# Patient Record
Sex: Female | Born: 1980 | Hispanic: No | Marital: Single | Smoking: Current every day smoker
Health system: Southern US, Community
[De-identification: ages and names within clinical notes are randomized; demographics above are authoritative.]

## PROBLEM LIST (undated history)

## (undated) ENCOUNTER — Emergency Department (HOSPITAL_COMMUNITY): Discharge status: Against Medical Advice | Payer: Medicaid Other

## (undated) DIAGNOSIS — R87619 Unspecified abnormal cytological findings in specimens from cervix uteri: Secondary | ICD-10-CM

## (undated) DIAGNOSIS — M549 Dorsalgia, unspecified: Secondary | ICD-10-CM

## (undated) DIAGNOSIS — IMO0002 Reserved for concepts with insufficient information to code with codable children: Secondary | ICD-10-CM

## (undated) DIAGNOSIS — F419 Anxiety disorder, unspecified: Secondary | ICD-10-CM

## (undated) DIAGNOSIS — G8929 Other chronic pain: Secondary | ICD-10-CM

## (undated) DIAGNOSIS — M1990 Unspecified osteoarthritis, unspecified site: Secondary | ICD-10-CM

## (undated) DIAGNOSIS — M51369 Other intervertebral disc degeneration, lumbar region without mention of lumbar back pain or lower extremity pain: Secondary | ICD-10-CM

## (undated) DIAGNOSIS — N83209 Unspecified ovarian cyst, unspecified side: Secondary | ICD-10-CM

## (undated) DIAGNOSIS — M4310 Spondylolisthesis, site unspecified: Secondary | ICD-10-CM

## (undated) DIAGNOSIS — R51 Headache: Secondary | ICD-10-CM

## (undated) DIAGNOSIS — R519 Headache, unspecified: Secondary | ICD-10-CM

## (undated) DIAGNOSIS — M5136 Other intervertebral disc degeneration, lumbar region: Secondary | ICD-10-CM

## (undated) HISTORY — DX: Unspecified osteoarthritis, unspecified site: M19.90

## (undated) HISTORY — DX: Headache, unspecified: R51.9

## (undated) HISTORY — DX: Spondylolisthesis, site unspecified: M43.10

## (undated) HISTORY — DX: Unspecified abnormal cytological findings in specimens from cervix uteri: R87.619

## (undated) HISTORY — DX: Other intervertebral disc degeneration, lumbar region without mention of lumbar back pain or lower extremity pain: M51.369

## (undated) HISTORY — DX: Other intervertebral disc degeneration, lumbar region: M51.36

## (undated) HISTORY — DX: Headache: R51

## (undated) HISTORY — PX: OTHER SURGICAL HISTORY: SHX169

## (undated) HISTORY — DX: Reserved for concepts with insufficient information to code with codable children: IMO0002

---

## 2004-08-08 ENCOUNTER — Emergency Department (HOSPITAL_COMMUNITY): Admission: EM | Admit: 2004-08-08 | Discharge: 2004-08-08 | Payer: Self-pay | Admitting: Emergency Medicine

## 2005-11-06 ENCOUNTER — Emergency Department (HOSPITAL_COMMUNITY): Admission: EM | Admit: 2005-11-06 | Discharge: 2005-11-06 | Payer: Self-pay | Admitting: Emergency Medicine

## 2007-02-19 ENCOUNTER — Emergency Department (HOSPITAL_COMMUNITY): Admission: EM | Admit: 2007-02-19 | Discharge: 2007-02-19 | Payer: Self-pay | Admitting: Emergency Medicine

## 2009-06-28 ENCOUNTER — Emergency Department (HOSPITAL_COMMUNITY): Admission: EM | Admit: 2009-06-28 | Discharge: 2009-06-28 | Payer: Self-pay | Admitting: Emergency Medicine

## 2010-01-21 ENCOUNTER — Emergency Department (HOSPITAL_COMMUNITY): Admission: EM | Admit: 2010-01-21 | Discharge: 2010-01-21 | Payer: Self-pay | Admitting: Emergency Medicine

## 2010-05-13 ENCOUNTER — Emergency Department (HOSPITAL_COMMUNITY): Admission: EM | Admit: 2010-05-13 | Discharge: 2010-05-14 | Payer: Self-pay | Admitting: Emergency Medicine

## 2010-08-07 ENCOUNTER — Encounter
Admission: RE | Admit: 2010-08-07 | Discharge: 2010-09-06 | Payer: Self-pay | Source: Home / Self Care | Attending: Orthopedic Surgery | Admitting: Orthopedic Surgery

## 2010-09-11 ENCOUNTER — Encounter
Admission: RE | Admit: 2010-09-11 | Discharge: 2010-10-09 | Payer: Self-pay | Source: Home / Self Care | Attending: Orthopedic Surgery | Admitting: Orthopedic Surgery

## 2010-09-25 ENCOUNTER — Emergency Department (HOSPITAL_COMMUNITY)
Admission: EM | Admit: 2010-09-25 | Discharge: 2010-09-25 | Payer: Self-pay | Source: Home / Self Care | Admitting: Emergency Medicine

## 2010-09-30 ENCOUNTER — Encounter: Payer: Self-pay | Admitting: Family Medicine

## 2010-12-13 LAB — BASIC METABOLIC PANEL
BUN: 6 mg/dL (ref 6–23)
CO2: 27 mEq/L (ref 19–32)
Calcium: 9.4 mg/dL (ref 8.4–10.5)
Chloride: 105 mEq/L (ref 96–112)
Creatinine, Ser: 0.7 mg/dL (ref 0.4–1.2)
GFR calc Af Amer: >60 mL/min (ref >60)
GFR calc non Af Amer: >60 mL/min (ref >60)
Glucose, Bld: 88 mg/dL (ref 70–99)
Potassium: 3.8 mEq/L (ref 3.5–5.1)
Sodium: 138 mEq/L (ref 135–145)

## 2010-12-13 LAB — CBC
HCT: 39 % (ref 36.0–46.0)
Hemoglobin: 13.6 g/dL (ref 12.0–15.0)
MCHC: 34.9 g/dL (ref 30.0–36.0)
MCV: 89.9 fL (ref 78.0–100.0)
Platelets: 184 10*3/uL (ref 150–400)
RBC: 4.34 MIL/uL (ref 3.87–5.11)
RDW: 14.1 % (ref 11.5–15.5)
WBC: 5.2 10*3/uL (ref 4.0–10.5)

## 2010-12-13 LAB — DIFFERENTIAL
Basophils Absolute: 0 10*3/uL (ref 0.0–0.1)
Basophils Relative: 0 % (ref 0–1)
Eosinophils Absolute: 0.1 10*3/uL (ref 0.0–0.7)
Eosinophils Relative: 2 % (ref 0–5)
Lymphocytes Relative: 37 % (ref 12–46)
Lymphs Abs: 1.9 10*3/uL (ref 0.7–4.0)
Monocytes Absolute: 0.2 10*3/uL (ref 0.1–1.0)
Monocytes Relative: 4 % (ref 3–12)
Neutro Abs: 2.9 10*3/uL (ref 1.7–7.7)
Neutrophils Relative %: 57 % (ref 43–77)

## 2010-12-13 LAB — POCT CARDIAC MARKERS
CKMB, poc: <1 ng/mL — ABNORMAL LOW (ref 1.0–8.0)
Myoglobin, poc: 64.2 ng/mL (ref 12–200)
Troponin i, poc: <0.05 ng/mL (ref 0.00–0.09)

## 2010-12-13 LAB — D-DIMER, QUANTITATIVE: D-Dimer, Quant: 0.22 ug/mL-FEU (ref 0.00–0.48)

## 2011-02-08 ENCOUNTER — Ambulatory Visit (HOSPITAL_COMMUNITY)
Admission: RE | Admit: 2011-02-08 | Discharge: 2011-02-08 | Disposition: A | Discharge status: Home / Self Care | Payer: Medicaid Other | Source: Ambulatory Visit | Attending: Pulmonary Disease | Admitting: Pulmonary Disease

## 2011-02-08 ENCOUNTER — Other Ambulatory Visit (HOSPITAL_COMMUNITY): Payer: Self-pay | Admitting: Pulmonary Disease

## 2011-02-08 DIAGNOSIS — A159 Respiratory tuberculosis unspecified: Secondary | ICD-10-CM

## 2011-02-08 DIAGNOSIS — R7611 Nonspecific reaction to tuberculin skin test without active tuberculosis: Secondary | ICD-10-CM | POA: Insufficient documentation

## 2011-06-02 ENCOUNTER — Emergency Department (HOSPITAL_COMMUNITY)
Admission: EM | Admit: 2011-06-02 | Discharge: 2011-06-02 | Disposition: A | Discharge status: Home / Self Care | Payer: Self-pay | Attending: Emergency Medicine | Admitting: Emergency Medicine

## 2011-06-02 ENCOUNTER — Encounter: Payer: Self-pay | Admitting: *Deleted

## 2011-06-02 ENCOUNTER — Other Ambulatory Visit: Payer: Self-pay

## 2011-06-02 ENCOUNTER — Emergency Department (HOSPITAL_COMMUNITY): Payer: Self-pay

## 2011-06-02 DIAGNOSIS — R0789 Other chest pain: Secondary | ICD-10-CM | POA: Insufficient documentation

## 2011-06-02 DIAGNOSIS — F17200 Nicotine dependence, unspecified, uncomplicated: Secondary | ICD-10-CM | POA: Insufficient documentation

## 2011-06-02 HISTORY — DX: Reserved for concepts with insufficient information to code with codable children: IMO0002

## 2011-06-02 LAB — BASIC METABOLIC PANEL
CO2: 28 mEq/L (ref 19–32)
Chloride: 100 mEq/L (ref 96–112)
Creatinine, Ser: 0.71 mg/dL (ref 0.50–1.10)
GFR calc Af Amer: >60 mL/min (ref >60)
Glucose, Bld: 81 mg/dL (ref 70–99)
Potassium: 3.5 mEq/L (ref 3.5–5.1)
Sodium: 135 mEq/L (ref 135–145)

## 2011-06-02 LAB — TROPONIN I: Troponin I: <0.3 ng/mL (ref <0.30)

## 2011-06-02 LAB — DIFFERENTIAL
Basophils Absolute: 0 10*3/uL (ref 0.0–0.1)
Basophils Relative: 0 % (ref 0–1)
Eosinophils Absolute: 0.1 10*3/uL (ref 0.0–0.7)
Eosinophils Relative: 2 % (ref 0–5)
Lymphocytes Relative: 29 % (ref 12–46)
Lymphs Abs: 2 10*3/uL (ref 0.7–4.0)
Monocytes Relative: 4 % (ref 3–12)
Neutrophils Relative %: 65 % (ref 43–77)

## 2011-06-02 LAB — CBC
Hemoglobin: 12.7 g/dL (ref 12.0–15.0)
MCH: 31.1 pg (ref 26.0–34.0)
MCV: 92.6 fL (ref 78.0–100.0)
Platelets: 191 10*3/uL (ref 150–400)
RBC: 4.08 MIL/uL (ref 3.87–5.11)
WBC: 6.8 10*3/uL (ref 4.0–10.5)

## 2011-06-02 MED ORDER — IBUPROFEN 800 MG PO TABS: 800.0000 mg | ORAL_TABLET | Freq: Three times a day (TID) | ORAL | Status: AC | PRN

## 2011-06-02 NOTE — ED Notes (Signed)
Pt c/o right sided chest pressure that radiates up neck and to head

## 2011-06-02 NOTE — ED Provider Notes (Signed)
History    Scribed for Yesenia Bonier, MD, the patient was seen in room APA19/APA19. This chart was scribed by Katha Cabal. This patient's care was started at 3:06 PM.     CSN: [id number] Arrival date & time: 06/02/2011  2:44 PM  Chief Complaint  Patient presents with  . Chest Pain    right side    HPI  (Consider location/radiation/quality/duration/timing/severity/associated sxs/prior treatment)  HPI  Yesenia Gates is a 30 y.o. female who presents to the Emergency Department complaining of intermittent dull right sided chest pain described as pressure that radiates up neck to the back of her head.  Chest pain is associated with discomfort in right arm, pressure in back on head, SOB (resolved), and lightheadedness (resolved).  Patient states chest pain was sharp at onset of sx around1 PM.  Patient states that head pain began upon arrival in ED.  Denies vision disturbance, cough, fever, n/v/d, abdominal pain, recent heavy lifting, CAD, and HTN.  States head pain began after she arrived in ED.  Patient adds hx of herniated disc with baseline back pain.     PAST MEDICAL HISTORY:  Past Medical History  Diagnosis Date  . Herniated disc     PAST SURGICAL HISTORY:  History reviewed. No pertinent past surgical history.  FAMILY HISTORY:  History reviewed. No pertinent family history.   SOCIAL HISTORY: History   Social History  . Marital Status: Single    Spouse Name: N/A    Number of Children: N/A  . Years of Education: N/A   Social History Main Topics  . Smoking status: Current Everyday Smoker  . Smokeless tobacco: None  . Alcohol Use:      occasionally  . Drug Use: No  . Sexually Active:    Other Topics Concern  . None   Social History Narrative  . None     Review of Systems  Review of Systems 10 Systems reviewed and are negative for acute change except as noted in the HPI.  Allergies  Penicillins  Home Medications   Current Outpatient Rx  Name  Route Sig Dispense Refill  . IBUPROFEN 800 MG PO TABS Oral Take 1 tablet (800 mg total) by mouth every 8 (eight) hours as needed for pain. 20 tablet 0    Physical Exam    BP 120/65  Pulse 89  Temp(Src) 98.7 F (37.1 C) (Oral)  Resp 18  Ht 5\' 6"  (1.676 m)  Wt 154 lb (69.854 kg)  BMI 24.86 kg/m2  SpO2 100%  LMP 04/24/2011  Physical Exam  Constitutional: She appears well-developed and well-nourished.  HENT:  Head: Normocephalic and atraumatic.  Right Ear: External ear normal.  Left Ear: External ear normal.  Mouth/Throat: Oropharynx is clear and moist.  Eyes: EOM are normal. Pupils are equal, round, and reactive to light.       Nlm fundoscopy.  Nlm peripheral vision.   Neck: Neck supple.  Cardiovascular: Normal rate, regular rhythm and normal heart sounds.   No murmur heard. Pulmonary/Chest: Effort normal and breath sounds normal. No respiratory distress. She has no wheezes. She has no rales. She exhibits tenderness.       Right anterior chest tenderness.  Pain is reproducible with palpation.   Lungs are clear bilaterally.   Abdominal: Soft. She exhibits no distension. There is no tenderness. There is no rebound.  Musculoskeletal: Normal range of motion. She exhibits no edema.  Neurological: She is alert.  Reflex Scores:      Patellar  reflexes are 2+ on the right side and 2+ on the left side.      Nlm coordination bilaterally.     Skin: Skin is warm and dry. No rash noted.  Psychiatric: She has a normal mood and affect. Her behavior is normal.    ED Course  Procedures (including critical care time) OTHER DATA REVIEWED: Nursing notes, vital signs, and past medical records reviewed.   DIAGNOSTIC STUDIES: Oxygen Saturation is 100% on room air, normal by my interpretation.     Date: 06/02/2011  Rate: 85  Rhythm: normal sinus rhythm  QRS Axis: normal  Intervals: normal  ST/T Wave abnormalities: normal  Conduction Disutrbances:none  Narrative Interpretation:   Old  EKG Reviewed: unchanged   Normal, Interpreted by EDMD.   LABS / RADIOLOGY:  Results for orders placed during the hospital encounter of 06/02/11  TROPONIN I      Component Value Range   Troponin I <0.30  <0.30 (ng/mL)  BASIC METABOLIC PANEL      Component Value Range   Sodium 135  135 - 145 (mEq/L)   Potassium 3.5  3.5 - 5.1 (mEq/L)   Chloride 100  96 - 112 (mEq/L)   CO2 28  19 - 32 (mEq/L)   Glucose, Bld 81  70 - 99 (mg/dL)   BUN 10  6 - 23 (mg/dL)   Creatinine, Ser 0.98  0.50 - 1.10 (mg/dL)   Calcium 9.2  8.4 - 11.9 (mg/dL)   GFR calc non Af Amer >60  >60 (mL/min)   GFR calc Af Amer >60  >60 (mL/min)  CBC      Component Value Range   WBC 6.8  4.0 - 10.5 (K/uL)   RBC 4.08  3.87 - 5.11 (MIL/uL)   Hemoglobin 12.7  12.0 - 15.0 (g/dL)   HCT 14.7  82.9 - 56.2 (%)   MCV 92.6  78.0 - 100.0 (fL)   MCH 31.1  26.0 - 34.0 (pg)   MCHC 33.6  30.0 - 36.0 (g/dL)   RDW 13.0  86.5 - 78.4 (%)   Platelets 191  150 - 400 (K/uL)  DIFFERENTIAL      Component Value Range   Neutrophils Relative 65  43 - 77 (%)   Neutro Abs 4.4  1.7 - 7.7 (K/uL)   Lymphocytes Relative 29  12 - 46 (%)   Lymphs Abs 2.0  0.7 - 4.0 (K/uL)   Monocytes Relative 4  3 - 12 (%)   Monocytes Absolute 0.3  0.1 - 1.0 (K/uL)   Eosinophils Relative 2  0 - 5 (%)   Eosinophils Absolute 0.1  0.0 - 0.7 (K/uL)   Basophils Relative 0  0 - 1 (%)   Basophils Absolute 0.0  0.0 - 0.1 (K/uL)     Dg Chest 2 View  06/02/2011  *RADIOLOGY REPORT*  Clinical Data: Chest pain.  CHEST - 2 VIEW 06/02/2011:  Comparison: Two-view chest x-ray 02/08/2011 and 06/28/2009 [facility].  Findings: Cardiomediastinal silhouette unremarkable.  Lungs clear. Bronchovascular markings normal.  Pulmonary vascularity normal.  No pleural effusions.  No pneumothorax.  Visualized bony thorax intact.  No significant interval change.  IMPRESSION: Normal and stable examination.  Original Report Authenticated By: Arnell Sieving, M.D.       ED COURSE  / COORDINATION OF CARE: 3:49 PM  Physical exam complete.    Orders Placed This Encounter  Procedures  . DG Chest 2 View  . Troponin I  . Basic metabolic panel  .  CBC  . Differential   the patient appears to have uncomplicated musculoskeletal chest pain. I will discharge her home on symptomatic treatment.  MDM:  DDX: Musculoskeletal chest pain, Pneumonia , tension headache.  MI and acute coronary syndrome is thought to be less likely based on her hx and physical examination.     IMPRESSION: Diagnoses that have been ruled out:  Diagnoses that are still under consideration:  Final diagnoses:  Musculoskeletal chest pain     MEDICATIONS GIVEN IN THE E.D. Scheduled Meds:   Continuous Infusions:      DISCHARGE MEDICATIONS: New Prescriptions   IBUPROFEN (ADVIL,MOTRIN) 800 MG TABLET    Take 1 tablet (800 mg total) by mouth every 8 (eight) hours as needed for pain.     I personally performed the services described in this documentation, which was scribed in my presence. The recorded information has been reviewed and considered.            Yesenia Bonier, MD 06/02/11 [phone]

## 2011-06-28 ENCOUNTER — Other Ambulatory Visit: Payer: Self-pay | Admitting: Otolaryngology

## 2011-06-28 DIAGNOSIS — R599 Enlarged lymph nodes, unspecified: Secondary | ICD-10-CM

## 2011-11-12 ENCOUNTER — Ambulatory Visit: Payer: Medicaid Other | Attending: Physical Therapy | Admitting: Physical Therapy

## 2011-12-13 ENCOUNTER — Other Ambulatory Visit: Payer: Self-pay | Admitting: Family Medicine

## 2011-12-13 DIAGNOSIS — M545 Low back pain, unspecified: Secondary | ICD-10-CM

## 2011-12-17 ENCOUNTER — Ambulatory Visit (HOSPITAL_COMMUNITY): Admission: RE | Admit: 2011-12-17 | Payer: Medicaid Other | Source: Ambulatory Visit

## 2012-01-21 ENCOUNTER — Other Ambulatory Visit (HOSPITAL_COMMUNITY): Payer: Medicaid Other

## 2012-01-21 ENCOUNTER — Ambulatory Visit (HOSPITAL_COMMUNITY)
Admission: RE | Admit: 2012-01-21 | Discharge: 2012-01-21 | Disposition: A | Discharge status: Home / Self Care | Payer: Medicaid Other | Source: Ambulatory Visit | Attending: Family Medicine | Admitting: Family Medicine

## 2012-01-21 DIAGNOSIS — M545 Low back pain, unspecified: Secondary | ICD-10-CM

## 2012-03-24 ENCOUNTER — Ambulatory Visit (HOSPITAL_COMMUNITY)
Admission: RE | Admit: 2012-03-24 | Discharge: 2012-03-24 | Disposition: A | Discharge status: Home / Self Care | Payer: Medicaid Other | Source: Ambulatory Visit | Attending: Otolaryngology | Admitting: Otolaryngology

## 2012-03-24 DIAGNOSIS — R599 Enlarged lymph nodes, unspecified: Secondary | ICD-10-CM | POA: Insufficient documentation

## 2012-04-16 ENCOUNTER — Emergency Department (HOSPITAL_COMMUNITY)
Admission: EM | Admit: 2012-04-16 | Discharge: 2012-04-16 | Disposition: A | Discharge status: Home / Self Care | Payer: Medicaid Other | Attending: Emergency Medicine | Admitting: Emergency Medicine

## 2012-04-16 ENCOUNTER — Encounter (HOSPITAL_COMMUNITY): Payer: Self-pay | Admitting: Emergency Medicine

## 2012-04-16 DIAGNOSIS — F17200 Nicotine dependence, unspecified, uncomplicated: Secondary | ICD-10-CM | POA: Insufficient documentation

## 2012-04-16 DIAGNOSIS — J029 Acute pharyngitis, unspecified: Secondary | ICD-10-CM

## 2012-04-16 LAB — RAPID STREP SCREEN (MED CTR MEBANE ONLY): Streptococcus, Group A Screen (Direct): NEGATIVE

## 2012-04-16 MED ORDER — AZITHROMYCIN 250 MG PO TABS: ORAL_TABLET | ORAL | Status: DC

## 2012-04-16 MED ORDER — AZITHROMYCIN 250 MG PO TABS
500.0000 mg | ORAL_TABLET | Freq: Once | ORAL | Status: AC
  Administered 2012-04-16: 500 mg via ORAL
  Filled 2012-04-16: qty 2

## 2012-04-16 NOTE — ED Provider Notes (Signed)
History     CSN: [id number]  Arrival date & time 04/16/12  0844   First MD Initiated Contact with Patient 04/16/12 [phone]      Chief Complaint  Patient presents with  . Sore Throat    (Consider location/radiation/quality/duration/timing/severity/associated sxs/prior treatment) HPI Comments: Denies fever, chills or headache.  Patient is a 31 y.o. female presenting with pharyngitis. The history is provided by the patient. No language interpreter was used.  Sore Throat This is a new problem. Episode onset: 3 days ago. The problem occurs constantly. The problem has been unchanged. Associated symptoms include a sore throat. Pertinent negatives include no chest pain, chills, fever or headaches. The symptoms are aggravated by swallowing. Treatments tried: tramadol. The treatment provided mild relief.    Past Medical History  Diagnosis Date  . Herniated disc     History reviewed. No pertinent past surgical history.  History reviewed. No pertinent family history.  History  Substance Use Topics  . Smoking status: Current Everyday Smoker  . Smokeless tobacco: Not on file  . Alcohol Use:      occasionally    OB History    Grav Para Term Preterm Abortions TAB SAB Ect Mult Living                  Review of Systems  Constitutional: Negative for fever and chills.  HENT: Positive for sore throat.   Respiratory: Negative for shortness of breath.   Cardiovascular: Negative for chest pain.  Neurological: Negative for headaches.  All other systems reviewed and are negative.    Allergies  Penicillins  Home Medications   Current Outpatient Rx  Name Route Sig Dispense Refill  . MEDROXYPROGESTERONE ACETATE 150 MG/ML IM SUSP Intramuscular Inject 150 mg into the muscle every 3 (three) months.    . WOMENS ONE DAILY PO TABS Oral Take 1 tablet by mouth daily.    . TRAMADOL HCL 50 MG PO TABS Oral Take 50 mg by mouth every 6 (six) hours as needed. For pain    . AZITHROMYCIN 250 MG PO  TABS  One tab po QD.  (initial dose given in ED) 4 tablet 0    BP 106/64  Pulse 100  Temp 98.3 F (36.8 C) (Oral)  Resp 17  SpO2 99%  LMP 03/16/2012  Physical Exam  Nursing note and vitals reviewed. Constitutional: She is oriented to person, place, and time. She appears well-developed and well-nourished. No distress.  HENT:  Head: Normocephalic and atraumatic.  Mouth/Throat: Uvula is midline and mucous membranes are normal. No uvula swelling. Oropharyngeal exudate and posterior oropharyngeal erythema present. No posterior oropharyngeal edema or tonsillar abscesses.  Eyes: EOM are normal.  Neck: Normal range of motion.  Cardiovascular: Normal rate, regular rhythm and normal heart sounds.   Pulmonary/Chest: Effort normal and breath sounds normal.  Abdominal: Soft. She exhibits no distension. There is no tenderness.  Musculoskeletal: Normal range of motion.  Neurological: She is alert and oriented to person, place, and time.  Skin: Skin is warm and dry.  Psychiatric: She has a normal mood and affect. Judgment normal.    ED Course  Procedures (including critical care time)   Labs Reviewed  RAPID STREP SCREEN   No results found.   1. Pharyngitis       MDM  rx-zithromax Tramadol, ibuprofen, salt water gargle and chloraseptic.  F/u with PCP prn        Evalina Field, PA 04/16/12 [phone]

## 2012-04-16 NOTE — ED Notes (Signed)
Pt c/o sore throat x 3 days. Nad. No fevers

## 2012-04-17 NOTE — ED Provider Notes (Signed)
Medical screening examination/treatment/procedure(s) were performed by non-physician practitioner and as supervising physician I was immediately available for consultation/collaboration.  Daveah Varone, MD 04/17/12 0842 

## 2012-04-22 ENCOUNTER — Encounter (HOSPITAL_COMMUNITY): Payer: Self-pay | Admitting: *Deleted

## 2012-04-22 ENCOUNTER — Emergency Department (HOSPITAL_COMMUNITY)
Admission: EM | Admit: 2012-04-22 | Discharge: 2012-04-22 | Disposition: A | Discharge status: Home / Self Care | Payer: Medicaid Other | Attending: Emergency Medicine | Admitting: Emergency Medicine

## 2012-04-22 ENCOUNTER — Emergency Department (HOSPITAL_COMMUNITY): Payer: Medicaid Other

## 2012-04-22 DIAGNOSIS — T07XXXA Unspecified multiple injuries, initial encounter: Secondary | ICD-10-CM | POA: Insufficient documentation

## 2012-04-22 DIAGNOSIS — F17200 Nicotine dependence, unspecified, uncomplicated: Secondary | ICD-10-CM | POA: Insufficient documentation

## 2012-04-22 DIAGNOSIS — S93409A Sprain of unspecified ligament of unspecified ankle, initial encounter: Secondary | ICD-10-CM | POA: Insufficient documentation

## 2012-04-22 DIAGNOSIS — Y92009 Unspecified place in unspecified non-institutional (private) residence as the place of occurrence of the external cause: Secondary | ICD-10-CM | POA: Insufficient documentation

## 2012-04-22 MED ORDER — HYDROCODONE-ACETAMINOPHEN 5-325 MG PO TABS: 1.0000 | ORAL_TABLET | ORAL | Status: AC | PRN

## 2012-04-22 MED ORDER — IBUPROFEN 600 MG PO TABS: 600.0000 mg | ORAL_TABLET | Freq: Four times a day (QID) | ORAL | Status: AC | PRN

## 2012-04-22 MED ORDER — DOUBLE ANTIBIOTIC 500-10000 UNIT/GM EX OINT
TOPICAL_OINTMENT | Freq: Once | CUTANEOUS | Status: AC
  Administered 2012-04-22: 17:00:00 via TOPICAL
  Filled 2012-04-22: qty 1

## 2012-04-22 MED ORDER — TETANUS-DIPHTH-ACELL PERTUSSIS 5-2.5-18.5 LF-MCG/0.5 IM SUSP
0.5000 mL | Freq: Once | INTRAMUSCULAR | Status: AC
  Administered 2012-04-22: 0.5 mL via INTRAMUSCULAR
  Filled 2012-04-22: qty 0.5
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
## 2012-04-22 NOTE — ED Provider Notes (Signed)
Medical screening examination/treatment/procedure(s) were performed by non-physician practitioner and as supervising physician I was immediately available for consultation/collaboration.  Devoria Albe, MD, Armando Gang   Ward Givens, MD 04/22/12 [phone]
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
## 2012-06-23 ENCOUNTER — Emergency Department (HOSPITAL_COMMUNITY)
Admission: EM | Admit: 2012-06-23 | Discharge: 2012-06-23 | Disposition: A | Discharge status: Home / Self Care | Payer: Medicaid Other

## 2012-07-06 ENCOUNTER — Emergency Department (HOSPITAL_COMMUNITY)
Admission: EM | Admit: 2012-07-06 | Discharge: 2012-07-07 | Disposition: A | Discharge status: Home / Self Care | Payer: Medicaid Other | Attending: Emergency Medicine | Admitting: Emergency Medicine

## 2012-07-06 ENCOUNTER — Encounter (HOSPITAL_COMMUNITY): Payer: Self-pay

## 2012-07-06 ENCOUNTER — Other Ambulatory Visit: Payer: Self-pay

## 2012-07-06 ENCOUNTER — Emergency Department (HOSPITAL_COMMUNITY): Payer: Medicaid Other

## 2012-07-06 DIAGNOSIS — Z8739 Personal history of other diseases of the musculoskeletal system and connective tissue: Secondary | ICD-10-CM | POA: Insufficient documentation

## 2012-07-06 DIAGNOSIS — R0789 Other chest pain: Secondary | ICD-10-CM

## 2012-07-06 DIAGNOSIS — Z79899 Other long term (current) drug therapy: Secondary | ICD-10-CM | POA: Insufficient documentation

## 2012-07-06 DIAGNOSIS — R42 Dizziness and giddiness: Secondary | ICD-10-CM | POA: Insufficient documentation

## 2012-07-06 DIAGNOSIS — F17200 Nicotine dependence, unspecified, uncomplicated: Secondary | ICD-10-CM | POA: Insufficient documentation

## 2012-07-06 DIAGNOSIS — M7918 Myalgia, other site: Secondary | ICD-10-CM

## 2012-07-06 DIAGNOSIS — R071 Chest pain on breathing: Secondary | ICD-10-CM | POA: Insufficient documentation

## 2012-07-06 DIAGNOSIS — Z8659 Personal history of other mental and behavioral disorders: Secondary | ICD-10-CM | POA: Insufficient documentation

## 2012-07-06 DIAGNOSIS — IMO0001 Reserved for inherently not codable concepts without codable children: Secondary | ICD-10-CM | POA: Insufficient documentation

## 2012-07-06 HISTORY — DX: Anxiety disorder, unspecified: F41.9

## 2012-07-06 LAB — URINALYSIS, ROUTINE W REFLEX MICROSCOPIC
Bilirubin Urine: NEGATIVE
Glucose, UA: NEGATIVE mg/dL
Hgb urine dipstick: NEGATIVE
Specific Gravity, Urine: 1.02 (ref 1.005–1.030)

## 2012-07-06 LAB — CBC WITH DIFFERENTIAL/PLATELET
Basophils Absolute: 0 10*3/uL (ref 0.0–0.1)
Eosinophils Absolute: 0.1 10*3/uL (ref 0.0–0.7)
Eosinophils Relative: 2 % (ref 0–5)
HCT: 37.3 % (ref 36.0–46.0)
Lymphocytes Relative: 42 % (ref 12–46)
Lymphs Abs: 2.8 10*3/uL (ref 0.7–4.0)
MCH: 31.6 pg (ref 26.0–34.0)
MCV: 92.1 fL (ref 78.0–100.0)
Monocytes Absolute: 0.3 10*3/uL (ref 0.1–1.0)
RDW: 14.3 % (ref 11.5–15.5)
WBC: 6.6 10*3/uL (ref 4.0–10.5)

## 2012-07-06 LAB — BASIC METABOLIC PANEL
CO2: 25 mEq/L (ref 19–32)
Calcium: 9.1 mg/dL (ref 8.4–10.5)
Creatinine, Ser: 0.64 mg/dL (ref 0.50–1.10)
GFR calc non Af Amer: >90 mL/min (ref >90)
Glucose, Bld: 116 mg/dL — ABNORMAL HIGH (ref 70–99)

## 2012-07-06 MED ORDER — DIAZEPAM 5 MG PO TABS
5.0000 mg | ORAL_TABLET | Freq: Once | ORAL | Status: AC
  Administered 2012-07-06: 5 mg via ORAL
  Filled 2012-07-06: qty 1

## 2012-07-06 MED ORDER — POTASSIUM CHLORIDE 20 MEQ/15ML (10%) PO LIQD
40.0000 meq | Freq: Once | ORAL | Status: AC
  Administered 2012-07-06: 40 meq via ORAL
  Filled 2012-07-06: qty 30

## 2012-07-06 NOTE — ED Notes (Signed)
Pain in right side of head, going down right side of neck into right shoulder and right chest, under right breast per pt. Also goes into right leg per pt. Patient states that she was dizzy and nauseated at work.

## 2012-07-06 NOTE — ED Provider Notes (Signed)
History     CSN: [id number]  Arrival date & time 07/06/12  1952   First MD Initiated Contact with Patient 07/06/12 2100      Chief Complaint  Patient presents with  . Headache  . Chest Pain  . Dizziness    HPI Pt was seen at 2120.  Per pt, c/o gradual onset and persistence of constant right sided neck "pain" since this afternoon.  Pt states the pain radiates into her right shoulder and chest as well as radiates down her entire body to her right leg.  Denies injury, no headache, no CP/SOB, no abd pain, no flank pain, no N/V/D, no focal motor weakness, no tingling/numbness in extremities, no rash, no fevers.      Past Medical History  Diagnosis Date  . Herniated disc   . Anxiety     History reviewed. No pertinent past surgical history.   History  Substance Use Topics  . Smoking status: Current Every Day Smoker  . Smokeless tobacco: Not on file  . Alcohol Use: Yes     occasionally    Review of Systems ROS: Statement: All systems negative except as marked or noted in the HPI; Constitutional: Negative for fever and chills. ; ; Eyes: Negative for eye pain, redness and discharge. ; ; ENMT: Negative for ear pain, hoarseness, nasal congestion, sinus pressure and sore throat. ; ; Cardiovascular: +chest pain. Negative for palpitations, diaphoresis, dyspnea and peripheral edema. ; ; Respiratory: Negative for cough, wheezing and stridor. ; ; Gastrointestinal: Negative for nausea, vomiting, diarrhea, abdominal pain, blood in stool, hematemesis, jaundice and rectal bleeding. . ; ; Genitourinary: Negative for dysuria, flank pain and hematuria. ; ; Musculoskeletal: +neck and back pain. Negative for swelling and trauma.; ; Skin: Negative for pruritus, rash, abrasions, blisters, bruising and skin lesion.; ; Neuro: Negative for headache, lightheadedness and neck stiffness. Negative for weakness, altered level of consciousness , altered mental status, extremity weakness, paresthesias, involuntary  movement, seizure and syncope.       Allergies  Penicillins  Home Medications   Current Outpatient Rx  Name Route Sig Dispense Refill  . MEDROXYPROGESTERONE ACETATE 150 MG/ML IM SUSP Intramuscular Inject 150 mg into the muscle every 3 (three) months.      BP 111/70  Pulse 82  Temp 98.7 F (37.1 C) (Oral)  Resp 20  Ht 5\' 6"  (1.676 m)  Wt 170 lb (77.111 kg)  BMI 27.44 kg/m2  SpO2 95%  Physical Exam 2125: Physical examination:  Nursing notes reviewed; Vital signs and O2 SAT reviewed;  Constitutional: Well developed, Well nourished, Well hydrated, In no acute distress; Head:  Normocephalic, atraumatic; Eyes: EOMI, PERRL, No scleral icterus; ENMT: Mouth and pharynx normal, Mucous membranes moist; Neck: Supple, Full range of motion, No lymphadenopathy; Cardiovascular: Regular rate and rhythm, No murmur, rub, or gallop; Respiratory: Breath sounds clear & equal bilaterally, No rales, rhonchi, wheezes.  Speaking full sentences with ease, Normal respiratory effort/excursion; Chest: +tender right chest wall. No soft tissue crepitus, no rash. Movement normal; Abdomen: Soft, Nontender, Nondistended, Normal bowel sounds; Genitourinary: No CVA tenderness; Spine:  No midline CS, TS, LS tenderness.  +TTP right hypertonic trapezius muscle, right thoracic and lumbar paraspinal muscles;; Extremities: Pulses normal, No tenderness, No edema, No calf edema or asymmetry.; Neuro: AA&Ox3, Major CN grossly intact.  No facial droop.  Speech clear. Equal grips, strength 5/5 equal bilat UE's and LE's. No gross focal motor or sensory deficits in extremities.; Skin: Color normal, Warm, Dry.   ED Course  Procedures    MDM  MDM Reviewed: nursing note, vitals and previous chart Reviewed previous: ECG and labs Interpretation: labs, ECG and x-ray    Date: 07/06/2012  Rate: 94  Rhythm: normal sinus rhythm  QRS Axis: right  Intervals: normal  ST/T Wave abnormalities: normal  Conduction Disutrbances:none   Narrative Interpretation:   Old EKG Reviewed: changes noted; new RAD compared to previous EKG dated 06/02/2011.  Results for orders placed during the hospital encounter of 07/06/12  CBC WITH DIFFERENTIAL      Component Value Range   WBC 6.6  4.0 - 10.5 K/uL   RBC 4.05  3.87 - 5.11 MIL/uL   Hemoglobin 12.8  12.0 - 15.0 g/dL   HCT 21.3  08.6 - 57.8 %   MCV 92.1  78.0 - 100.0 fL   MCH 31.6  26.0 - 34.0 pg   MCHC 34.3  30.0 - 36.0 g/dL   RDW 46.9  62.9 - 52.8 %   Platelets 205  150 - 400 K/uL   Neutrophils Relative 51  43 - 77 %   Neutro Abs 3.4  1.7 - 7.7 K/uL   Lymphocytes Relative 42  12 - 46 %   Lymphs Abs 2.8  0.7 - 4.0 K/uL   Monocytes Relative 4  3 - 12 %   Monocytes Absolute 0.3  0.1 - 1.0 K/uL   Eosinophils Relative 2  0 - 5 %   Eosinophils Absolute 0.1  0.0 - 0.7 K/uL   Basophils Relative 0  0 - 1 %   Basophils Absolute 0.0  0.0 - 0.1 K/uL  BASIC METABOLIC PANEL      Component Value Range   Sodium 137  135 - 145 mEq/L   Potassium 3.2 (*) 3.5 - 5.1 mEq/L   Chloride 103  96 - 112 mEq/L   CO2 25  19 - 32 mEq/L   Glucose, Bld 116 (*) 70 - 99 mg/dL   BUN 9  6 - 23 mg/dL   Creatinine, Ser 4.13  0.50 - 1.10 mg/dL   Calcium 9.1  8.4 - 24.4 mg/dL   GFR calc non Af Amer >90  >90 mL/min   GFR calc Af Amer >90  >90 mL/min  TROPONIN I      Component Value Range   Troponin I <0.30  <0.30 ng/mL  D-DIMER, QUANTITATIVE      Component Value Range   D-Dimer, Quant <0.27  0.00 - 0.48 ug/mL-FEU  PREGNANCY, URINE      Component Value Range   Preg Test, Ur NEGATIVE  NEGATIVE  URINALYSIS, ROUTINE W REFLEX MICROSCOPIC      Component Value Range   Color, Urine YELLOW  YELLOW   APPearance CLEAR  CLEAR   Specific Gravity, Urine 1.020  1.005 - 1.030   pH 6.0  5.0 - 8.0   Glucose, UA NEGATIVE  NEGATIVE mg/dL   Hgb urine dipstick NEGATIVE  NEGATIVE   Bilirubin Urine NEGATIVE  NEGATIVE   Ketones, ur NEGATIVE  NEGATIVE mg/dL   Protein, ur NEGATIVE  NEGATIVE mg/dL   Urobilinogen, UA 0.2   0.0 - 1.0 mg/dL   Nitrite NEGATIVE  NEGATIVE   Leukocytes, UA NEGATIVE  NEGATIVE   Dg Chest 2 View 07/06/2012  *RADIOLOGY REPORT*  Clinical Data: Chest pain.  CHEST - 2 VIEW  Comparison: Two-view chest 06/02/2011.  Findings: The heart size is normal.  The lungs are clear.  The visualized soft tissues and bony thorax are unremarkable.  IMPRESSION: Negative chest.  Original Report Authenticated By: Jamesetta Orleans. MATTERN, M.D.      1610:  Pt has previous ED visits for similar symptoms and dx with msk pain; likely dx today given workup without acute findings. Doubt PE with negative d-dimer and low risk Well's.  Doubt ACS as cause for symptoms; TIMI 0.  Potassium repleted PO.  Wants to go home now.  Dx and testing d/w pt. Questions answered.  Verb understanding, agreeable to d/c home with outpt f/u.            Laray Anger, DO 07/09/12 2139

## 2012-07-07 ENCOUNTER — Encounter (HOSPITAL_COMMUNITY): Payer: Self-pay | Admitting: Emergency Medicine

## 2012-07-07 MED ORDER — HYDROCODONE-ACETAMINOPHEN 5-325 MG PO TABS: ORAL_TABLET | ORAL | Status: DC

## 2012-07-07 MED ORDER — METHOCARBAMOL 500 MG PO TABS: 1000.0000 mg | ORAL_TABLET | Freq: Four times a day (QID) | ORAL | Status: DC | PRN

## 2012-07-07 MED ORDER — NAPROXEN 250 MG PO TABS: 250.0000 mg | ORAL_TABLET | Freq: Two times a day (BID) | ORAL | Status: DC

## 2012-11-19 ENCOUNTER — Encounter (HOSPITAL_COMMUNITY): Payer: Self-pay | Admitting: *Deleted

## 2012-11-19 ENCOUNTER — Emergency Department (HOSPITAL_COMMUNITY)
Admission: EM | Admit: 2012-11-19 | Discharge: 2012-11-19 | Disposition: A | Discharge status: Home / Self Care | Payer: Medicaid Other | Attending: Emergency Medicine | Admitting: Emergency Medicine

## 2012-11-19 ENCOUNTER — Emergency Department (HOSPITAL_COMMUNITY): Payer: Medicaid Other

## 2012-11-19 DIAGNOSIS — F17200 Nicotine dependence, unspecified, uncomplicated: Secondary | ICD-10-CM | POA: Insufficient documentation

## 2012-11-19 DIAGNOSIS — R1031 Right lower quadrant pain: Secondary | ICD-10-CM

## 2012-11-19 DIAGNOSIS — Z8659 Personal history of other mental and behavioral disorders: Secondary | ICD-10-CM | POA: Insufficient documentation

## 2012-11-19 DIAGNOSIS — Z9889 Other specified postprocedural states: Secondary | ICD-10-CM | POA: Insufficient documentation

## 2012-11-19 DIAGNOSIS — R300 Dysuria: Secondary | ICD-10-CM | POA: Insufficient documentation

## 2012-11-19 DIAGNOSIS — Z8742 Personal history of other diseases of the female genital tract: Secondary | ICD-10-CM | POA: Insufficient documentation

## 2012-11-19 DIAGNOSIS — L293 Anogenital pruritus, unspecified: Secondary | ICD-10-CM | POA: Insufficient documentation

## 2012-11-19 DIAGNOSIS — Z8739 Personal history of other diseases of the musculoskeletal system and connective tissue: Secondary | ICD-10-CM | POA: Insufficient documentation

## 2012-11-19 DIAGNOSIS — R110 Nausea: Secondary | ICD-10-CM | POA: Insufficient documentation

## 2012-11-19 DIAGNOSIS — Z3202 Encounter for pregnancy test, result negative: Secondary | ICD-10-CM | POA: Insufficient documentation

## 2012-11-19 HISTORY — DX: Unspecified ovarian cyst, unspecified side: N83.209

## 2012-11-19 LAB — BASIC METABOLIC PANEL
BUN: 9 mg/dL (ref 6–23)
CO2: 26 mEq/L (ref 19–32)
Calcium: 9.2 mg/dL (ref 8.4–10.5)
Chloride: 102 mEq/L (ref 96–112)
Creatinine, Ser: 0.75 mg/dL (ref 0.50–1.10)
GFR calc Af Amer: >90 mL/min (ref >90)
GFR calc non Af Amer: >90 mL/min (ref >90)
Glucose, Bld: 93 mg/dL (ref 70–99)
Potassium: 4.2 mEq/L (ref 3.5–5.1)
Sodium: 139 mEq/L (ref 135–145)

## 2012-11-19 LAB — CBC WITH DIFFERENTIAL/PLATELET
Basophils Absolute: 0 10*3/uL (ref 0.0–0.1)
Basophils Relative: 0 % (ref 0–1)
Eosinophils Absolute: 0.1 10*3/uL (ref 0.0–0.7)
Eosinophils Relative: 2 % (ref 0–5)
HCT: 39 % (ref 36.0–46.0)
Hemoglobin: 13.3 g/dL (ref 12.0–15.0)
Lymphocytes Relative: 36 % (ref 12–46)
Lymphs Abs: 2.1 10*3/uL (ref 0.7–4.0)
MCH: 31.1 pg (ref 26.0–34.0)
MCHC: 34.1 g/dL (ref 30.0–36.0)
MCV: 91.1 fL (ref 78.0–100.0)
Monocytes Absolute: 0.3 10*3/uL (ref 0.1–1.0)
Monocytes Relative: 5 % (ref 3–12)
Neutro Abs: 3.4 10*3/uL (ref 1.7–7.7)
Neutrophils Relative %: 57 % (ref 43–77)
Platelets: 201 10*3/uL (ref 150–400)
RBC: 4.28 MIL/uL (ref 3.87–5.11)
RDW: 13.5 % (ref 11.5–15.5)
WBC: 5.9 10*3/uL (ref 4.0–10.5)

## 2012-11-19 LAB — URINALYSIS, ROUTINE W REFLEX MICROSCOPIC
Bilirubin Urine: NEGATIVE
Glucose, UA: NEGATIVE mg/dL
Hgb urine dipstick: NEGATIVE
Ketones, ur: NEGATIVE mg/dL
Leukocytes, UA: NEGATIVE
Nitrite: NEGATIVE
Protein, ur: NEGATIVE mg/dL
Specific Gravity, Urine: 1.02 (ref 1.005–1.030)
Urobilinogen, UA: 0.2 mg/dL (ref 0.0–1.0)
pH: 6.5 (ref 5.0–8.0)

## 2012-11-19 LAB — PREGNANCY, URINE: Preg Test, Ur: NEGATIVE

## 2012-11-19 LAB — WET PREP, GENITAL
Clue Cells Wet Prep HPF POC: NONE SEEN
Trich, Wet Prep: NONE SEEN
Yeast Wet Prep HPF POC: NONE SEEN

## 2012-11-19 NOTE — ED Provider Notes (Signed)
  Medical screening examination/treatment/procedure(s) were conducted as a shared visit with non-physician practitioner(s) and myself.  I personally evaluated the patient during the encounter  Please see my separate respective documentation pertaining to this patient encounter   Vida Roller, MD 11/19/12 2141

## 2012-11-19 NOTE — ED Notes (Signed)
Pelvic pain began 2 days ago. Hx of ovarian cysts.  Also c/o dysuria.

## 2012-11-19 NOTE — ED Provider Notes (Signed)
History     CSN: [id number]  Arrival date & time 11/19/12  1303   First MD Initiated Contact with Patient 11/19/12 1320      Chief Complaint  Patient presents with  . Pelvic Pain  . Nausea  . Dysuria    (Consider location/radiation/quality/duration/timing/severity/associated sxs/prior treatment) HPI Comments: The pt is a 32yo female with a hx of ovarian cysts presenting today with a 2 day hx of RLQ pain.  Pain is severe and sharp in nature, waxes and wanes throughout the day, radiates to the suprapubic region.  Does not radiate to lower back. Feels similar to ovarian cysts in the past but more intense and radiation of pain is new. Has tried Naproxen for pain without relief.  Pain is also associated with vaginal itching.  Denies discharge or dysuria, fever, or vomiting. Pt stopped Depo 3mo ago.  Has not had a menstrual cycle for 80yrs.  Pt unsure of possibility of pregnancy or STD.  Denies previous abdominal surgeries.  Still has gallbladder and appendix.  Patient is a 33 y.o. female presenting with pelvic pain and dysuria. The history is provided by the patient.  Pelvic Pain Associated symptoms include abdominal pain and nausea. Pertinent negatives include no vomiting.  Dysuria  Associated symptoms include nausea. Pertinent negatives include no vomiting.    Past Medical History  Diagnosis Date  . Herniated disc   . Anxiety   . Ovarian cyst     Past Surgical History  Procedure Laterality Date  . Dilation and cutterage      History reviewed. No pertinent family history.  History  Substance Use Topics  . Smoking status: Current Every Day Smoker  . Smokeless tobacco: Not on file  . Alcohol Use: Yes     Comment: occasionally    OB History   Grav Para Term Preterm Abortions TAB SAB Ect Mult Living   2 1 1              Review of Systems  Constitutional: Negative.   Gastrointestinal: Positive for nausea and abdominal pain. Negative for vomiting and abdominal distention.   Genitourinary: Positive for pelvic pain. Negative for dysuria.  All other systems reviewed and are negative.    Allergies  Penicillins  Home Medications  No current outpatient prescriptions on file.  BP 97/65  Pulse 87  Temp(Src) 98.7 F (37.1 C) (Oral)  Resp 16  Ht 5\' 6"  (1.676 m)  Wt 178 lb (80.74 kg)  BMI 28.74 kg/m2  SpO2 100%  Physical Exam  Nursing note and vitals reviewed. Constitutional: She is oriented to person, place, and time. She appears well-developed and well-nourished. No distress.  Cardiovascular: Normal rate, regular rhythm and normal heart sounds.   Pulmonary/Chest: Effort normal and breath sounds normal. She exhibits no tenderness.  Abdominal: Soft. Bowel sounds are normal. She exhibits no distension and no mass. There is tenderness. There is no rebound and no guarding.  Mild to moderate TTP RLQ and suprapubic region  Genitourinary: Vagina normal.  External exam: nl hair distribution, no erythema, lesions, or rash.   Pelvic exam: smooth, no masses or lesions, minimal clear to white vaginal discharge. Cervix pink without lesions, cervical os closed. Bimanual exam: No adnexal masses. No cervical motion tenderness.  Neurological: She is alert and oriented to person, place, and time.  Skin: Skin is warm and dry. She is not diaphoretic.  Psychiatric: She has a normal mood and affect. Her behavior is normal.    ED Course  Procedures (including  critical care time)  Labs Reviewed - No data to display US Transvaginal Non-ob  11/19/2012  *RADIOLOGY REPORT*  Clinical Data:  Right lower quadrant pain.  History of ovarian cyst.  Evaluate for torsion.  TRANSABDOMINAL AND TRANSVAGINAL ULTRASOUND OF PELVIS DOPPLER ULTRASOUND OF OVARIES  Technique:  Both transabdominal and transvaginal ultrasound examinations of the pelvis were performed. Transabdominal technique was performed for global imaging of the pelvis including uterus, ovaries, adnexal regions, and pelvic  cul-de-sac.  It was necessary to proceed with endovaginal exam following the transabdominal exam to visualize the uterus and endometrium.  Color and duplex Doppler ultrasound was utilized to evaluate blood flow to the ovaries.  Comparison:  None.  Findings:  Uterus:  Measures 7.6 x 4.2 x 6.1 cm.  Contains scattered hypoechoic lesions, measuring up to 10 x 4 x 9 mm.  Endometrium:  Measures 5 mm, within normal limits.  Right ovary: Measures 3.1 x 1.8 x 2.5 cm.  Arterial and venous waveforms are identified.  Left ovary:     Measures 2.8 x 1.7 x 2.7 cm.  Arterial and venous waveforms are identified.  There is a 1.7 x 1.1 x 1.2 cm hypoechoic lesion contained within, which may represent a hemorrhagic follicle.  Pulsed Doppler evaluation demonstrates normal low-resistance arterial and venous waveforms in both ovaries.  IMPRESSION:  1.  No sonographic evidence for ovarian torsion. 2.  Small uterine fibroids.   Original Report Authenticated By: Leanna Battles, M.D.    US Pelvis Complete  11/19/2012  *RADIOLOGY REPORT*  Clinical Data:  Right lower quadrant pain.  History of ovarian cyst.  Evaluate for torsion.  TRANSABDOMINAL AND TRANSVAGINAL ULTRASOUND OF PELVIS DOPPLER ULTRASOUND OF OVARIES  Technique:  Both transabdominal and transvaginal ultrasound examinations of the pelvis were performed. Transabdominal technique was performed for global imaging of the pelvis including uterus, ovaries, adnexal regions, and pelvic cul-de-sac.  It was necessary to proceed with endovaginal exam following the transabdominal exam to visualize the uterus and endometrium.  Color and duplex Doppler ultrasound was utilized to evaluate blood flow to the ovaries.  Comparison:  None.  Findings:  Uterus:  Measures 7.6 x 4.2 x 6.1 cm.  Contains scattered hypoechoic lesions, measuring up to 10 x 4 x 9 mm.  Endometrium:  Measures 5 mm, within normal limits.  Right ovary: Measures 3.1 x 1.8 x 2.5 cm.  Arterial and venous waveforms are identified.   Left ovary:     Measures 2.8 x 1.7 x 2.7 cm.  Arterial and venous waveforms are identified.  There is a 1.7 x 1.1 x 1.2 cm hypoechoic lesion contained within, which may represent a hemorrhagic follicle.  Pulsed Doppler evaluation demonstrates normal low-resistance arterial and venous waveforms in both ovaries.  IMPRESSION:  1.  No sonographic evidence for ovarian torsion. 2.  Small uterine fibroids.   Original Report Authenticated By: Leanna Battles, M.D.    [place] Art/ven Flow Abd Pelv Doppler  11/19/2012  *RADIOLOGY REPORT*  Clinical Data:  Right lower quadrant pain.  History of ovarian cyst.  Evaluate for torsion.  TRANSABDOMINAL AND TRANSVAGINAL ULTRASOUND OF PELVIS DOPPLER ULTRASOUND OF OVARIES  Technique:  Both transabdominal and transvaginal ultrasound examinations of the pelvis were performed. Transabdominal technique was performed for global imaging of the pelvis including uterus, ovaries, adnexal regions, and pelvic cul-de-sac.  It was necessary to proceed with endovaginal exam following the transabdominal exam to visualize the uterus and endometrium.  Color and duplex Doppler ultrasound was utilized to evaluate blood flow to the ovaries.  Comparison:  None.  Findings:  Uterus:  Measures 7.6 x 4.2 x 6.1 cm.  Contains scattered hypoechoic lesions, measuring up to 10 x 4 x 9 mm.  Endometrium:  Measures 5 mm, within normal limits.  Right ovary: Measures 3.1 x 1.8 x 2.5 cm.  Arterial and venous waveforms are identified.  Left ovary:     Measures 2.8 x 1.7 x 2.7 cm.  Arterial and venous waveforms are identified.  There is a 1.7 x 1.1 x 1.2 cm hypoechoic lesion contained within, which may represent a hemorrhagic follicle.  Pulsed Doppler evaluation demonstrates normal low-resistance arterial and venous waveforms in both ovaries.  IMPRESSION:  1.  No sonographic evidence for ovarian torsion. 2.  Small uterine fibroids.   Original Report Authenticated By: Leanna Battles, M.D.      1. RLQ abdominal pain        MDM  Pt presented with 2 day hx of colicky type RLQ pain. PMH significant for ovarian cysts.  Unlikely to be appendicitis: pain colicky in nature, no leukocytosis, no fever, chills or vomiting.     UA: nl  Not suggestive of UTI.  HCG: neg. Unlikely to be ectopic.  Pelvic U/S: no evidence of ovarian torsion, small uterine fibroids.  Fibroids may be the cause of current pain.  Pt advised all tests came back normal today.  Will receive results from GC/chlamydia within 1-2days and be called if results are abnormal.  Follow up with PCP for pain. May take OTC antiinflammatories for pain.   Vitals unremarkable. Discharged in stable condition.      Junius Finner, PA-C 11/19/12 1750

## 2012-11-19 NOTE — ED Provider Notes (Signed)
Female with a history of 2 days of intermittent right lower quadrant and pelvic pain described as sharp, intermittent, comes on at any time, not related to movement, palpation, urination or defecation. She denies any vaginal discharge or bleeding, has not had a menstrual period in some time, is having unprotected sex, does have a child.  On exam she has a nontender abdomen, very soft abdomen with no pain to the right lower quadrant, no hernia in the inguinal region or the femoral region on exam, no guarding, no masses, no peritoneal signs (chaperone present). Heart and lungs are clear, the patient is ambulatory without difficulty, does not appear to be in any distress.  Differential for this lady includes ovarian cyst, pelvic pathology, urinary tract infection, pregnancy, ectopic, much less likely appendicitis given the nature in fluctuating course. We'll need to rule out or ovarian torsion as well.  Medical screening examination/treatment/procedure(s) were conducted as a shared visit with non-physician practitioner(s) and myself.  I personally evaluated the patient during the encounter      Vida Roller, MD 11/19/12 1414

## 2012-11-20 LAB — GC/CHLAMYDIA PROBE AMP
CT Probe RNA: NEGATIVE
GC Probe RNA: NEGATIVE

## 2012-12-07 ENCOUNTER — Telehealth: Payer: Self-pay | Admitting: Nurse Practitioner

## 2012-12-07 NOTE — Telephone Encounter (Signed)
APPT MADE

## 2012-12-08 ENCOUNTER — Ambulatory Visit: Payer: Self-pay | Admitting: Nurse Practitioner

## 2012-12-28 ENCOUNTER — Encounter: Payer: Self-pay | Admitting: *Deleted

## 2012-12-31 ENCOUNTER — Telehealth: Payer: Self-pay | Admitting: Nurse Practitioner

## 2012-12-31 NOTE — Telephone Encounter (Signed)
Called [facility] got them to fax labs to [place] chart and labs on desk

## 2012-12-31 NOTE — Telephone Encounter (Signed)
WHat type of bacterial infection? Vaginal?

## 2012-12-31 NOTE — Telephone Encounter (Signed)
Has a bacterial infection can we please call something into [facility] we have no appointments

## 2012-12-31 NOTE — Telephone Encounter (Signed)
All labs WNL.

## 2012-12-31 NOTE — Telephone Encounter (Signed)
yes

## 2013-01-01 MED ORDER — METRONIDAZOLE 500 MG PO TABS: 500.0000 mg | ORAL_TABLET | Freq: Two times a day (BID) | ORAL | Status: DC

## 2013-01-01 NOTE — Telephone Encounter (Signed)
Did we get something called in for her? Please advise

## 2013-01-01 NOTE — Telephone Encounter (Signed)
Flagyl sent to pharmacy

## 2013-01-01 NOTE — Telephone Encounter (Signed)
Patient aware.

## 2013-01-28 ENCOUNTER — Emergency Department (HOSPITAL_COMMUNITY): Payer: Medicaid Other

## 2013-01-28 ENCOUNTER — Emergency Department (HOSPITAL_COMMUNITY)
Admission: EM | Admit: 2013-01-28 | Discharge: 2013-01-28 | Disposition: A | Discharge status: Home / Self Care | Payer: Medicaid Other | Attending: Emergency Medicine | Admitting: Emergency Medicine

## 2013-01-28 DIAGNOSIS — J029 Acute pharyngitis, unspecified: Secondary | ICD-10-CM | POA: Insufficient documentation

## 2013-01-28 DIAGNOSIS — F17200 Nicotine dependence, unspecified, uncomplicated: Secondary | ICD-10-CM | POA: Insufficient documentation

## 2013-01-28 DIAGNOSIS — Z880 Allergy status to penicillin: Secondary | ICD-10-CM | POA: Insufficient documentation

## 2013-01-28 DIAGNOSIS — Z8719 Personal history of other diseases of the digestive system: Secondary | ICD-10-CM | POA: Insufficient documentation

## 2013-01-28 DIAGNOSIS — Z8659 Personal history of other mental and behavioral disorders: Secondary | ICD-10-CM | POA: Insufficient documentation

## 2013-01-28 DIAGNOSIS — N949 Unspecified condition associated with female genital organs and menstrual cycle: Secondary | ICD-10-CM | POA: Insufficient documentation

## 2013-01-28 DIAGNOSIS — Z8742 Personal history of other diseases of the female genital tract: Secondary | ICD-10-CM | POA: Insufficient documentation

## 2013-01-28 DIAGNOSIS — R102 Pelvic and perineal pain: Secondary | ICD-10-CM

## 2013-01-28 DIAGNOSIS — B9689 Other specified bacterial agents as the cause of diseases classified elsewhere: Secondary | ICD-10-CM

## 2013-01-28 DIAGNOSIS — R350 Frequency of micturition: Secondary | ICD-10-CM | POA: Insufficient documentation

## 2013-01-28 DIAGNOSIS — N760 Acute vaginitis: Secondary | ICD-10-CM | POA: Insufficient documentation

## 2013-01-28 DIAGNOSIS — N898 Other specified noninflammatory disorders of vagina: Secondary | ICD-10-CM | POA: Insufficient documentation

## 2013-01-28 DIAGNOSIS — Z3202 Encounter for pregnancy test, result negative: Secondary | ICD-10-CM | POA: Insufficient documentation

## 2013-01-28 DIAGNOSIS — IMO0002 Reserved for concepts with insufficient information to code with codable children: Secondary | ICD-10-CM | POA: Insufficient documentation

## 2013-01-28 DIAGNOSIS — D219 Benign neoplasm of connective and other soft tissue, unspecified: Secondary | ICD-10-CM | POA: Insufficient documentation

## 2013-01-28 DIAGNOSIS — R3915 Urgency of urination: Secondary | ICD-10-CM | POA: Insufficient documentation

## 2013-01-28 LAB — URINALYSIS, ROUTINE W REFLEX MICROSCOPIC
Bilirubin Urine: NEGATIVE
Glucose, UA: NEGATIVE mg/dL
Specific Gravity, Urine: 1.025 (ref 1.005–1.030)
Urobilinogen, UA: 0.2 mg/dL (ref 0.0–1.0)
pH: 6 (ref 5.0–8.0)

## 2013-01-28 LAB — WET PREP, GENITAL
Trich, Wet Prep: NONE SEEN
Yeast Wet Prep HPF POC: NONE SEEN

## 2013-01-28 LAB — BASIC METABOLIC PANEL
BUN: 12 mg/dL (ref 6–23)
Calcium: 8.9 mg/dL (ref 8.4–10.5)
GFR calc Af Amer: >90 mL/min (ref >90)
GFR calc non Af Amer: >90 mL/min (ref >90)
Potassium: 4 mEq/L (ref 3.5–5.1)
Sodium: 137 mEq/L (ref 135–145)

## 2013-01-28 LAB — CBC WITH DIFFERENTIAL/PLATELET
Basophils Relative: 0 % (ref 0–1)
Eosinophils Absolute: 0.1 10*3/uL (ref 0.0–0.7)
Eosinophils Relative: 1 % (ref 0–5)
MCH: 31.4 pg (ref 26.0–34.0)
MCHC: 33.5 g/dL (ref 30.0–36.0)
Neutrophils Relative %: 69 % (ref 43–77)
Platelets: 186 10*3/uL (ref 150–400)

## 2013-01-28 LAB — URINE MICROSCOPIC-ADD ON

## 2013-01-28 MED ORDER — FLUCONAZOLE 150 MG PO TABS: 150.0000 mg | ORAL_TABLET | Freq: Once | ORAL | Status: DC

## 2013-01-28 MED ORDER — NAPROXEN SODIUM 550 MG PO TABS: 550.0000 mg | ORAL_TABLET | Freq: Two times a day (BID) | ORAL | Status: DC

## 2013-01-28 MED ORDER — KETOROLAC TROMETHAMINE 60 MG/2ML IM SOLN
60.0000 mg | Freq: Once | INTRAMUSCULAR | Status: AC
  Administered 2013-01-28: 60 mg via INTRAMUSCULAR
  Filled 2013-01-28: qty 2

## 2013-01-28 MED ORDER — METRONIDAZOLE 500 MG PO TABS: 500.0000 mg | ORAL_TABLET | Freq: Two times a day (BID) | ORAL | Status: DC

## 2013-01-28 NOTE — ED Provider Notes (Signed)
History     CSN: [id number]  Arrival date & time 01/28/13  8295   First MD Initiated Contact with Patient 01/28/13 [phone]      Chief Complaint  Patient presents with  . Abdominal Pain    (Consider location/radiation/quality/duration/timing/severity/associated sxs/prior treatment) Patient is a 32 y.o. female presenting with abdominal pain. The history is provided by the patient.  Abdominal Pain This is a new problem. The current episode started yesterday. The problem occurs constantly. The problem has been gradually worsening. Associated symptoms include abdominal pain, a sore throat and urinary symptoms. Pertinent negatives include no anorexia, change in bowel habit, congestion, coughing, diaphoresis, fever, headaches, joint swelling, myalgias, nausea, neck pain, rash, swollen glands, visual change, vomiting or weakness. The symptoms are aggravated by standing and walking. She has tried rest and NSAIDs for the symptoms. The treatment provided mild relief.   Yesenia Gates is a 32 y.o. female who presents to the ED with abdominal pain that started last night. She has frequent urination but no burning. LMP 01/07/13. Last pap smear one year ago and was abnormal but she has not been for follow up. Called to [facility] today and made appointment for 02/12/13.  Past Medical History  Diagnosis Date  . Herniated disc   . Anxiety   . Ovarian cyst     Past Surgical History  Procedure Laterality Date  . Dilation and cutterage      No family history on file.  History  Substance Use Topics  . Smoking status: Current Every Day Smoker  . Smokeless tobacco: Not on file  . Alcohol Use: Yes     Comment: occasionally    OB History   Grav Para Term Preterm Abortions TAB SAB Ect Mult Living   2 1 1              Review of Systems  Constitutional: Negative for fever and diaphoresis.  HENT: Positive for sore throat. Negative for ear pain, congestion and neck pain.   Eyes: Negative for  visual disturbance.  Respiratory: Negative for cough.   Gastrointestinal: Positive for abdominal pain. Negative for nausea, vomiting, anorexia and change in bowel habit.  Genitourinary: Positive for urgency, frequency, vaginal discharge, pelvic pain and dyspareunia. Negative for dysuria, vaginal bleeding and difficulty urinating.  Musculoskeletal: Negative for myalgias and joint swelling.  Skin: Negative for rash.  Allergic/Immunologic: Negative for immunocompromised state.  Neurological: Negative for weakness and headaches.  Psychiatric/Behavioral: The patient is not nervous/anxious (hx of anxiety and depression no medications right now).     Allergies  Penicillins  Home Medications   Current Outpatient Rx  Name  Route  Sig  Dispense  Refill  . metroNIDAZOLE (FLAGYL) 500 MG tablet   Oral   Take 1 tablet (500 mg total) by mouth 2 (two) times daily.   14 tablet   0     Pulse 91  Temp(Src) 97.2 F (36.2 C) (Oral)  Resp 16  Ht 5\' 6"  (1.676 m)  Wt 190 lb (86.183 kg)  BMI 30.68 kg/m2  SpO2 100%  LMP 01/07/2013  Physical Exam  Nursing note and vitals reviewed. Constitutional: She is oriented to person, place, and time. She appears well-developed and well-nourished. No distress.  HENT:  Head: Normocephalic.  Eyes: EOM are normal.  Neck: Normal range of motion. Neck supple.  Cardiovascular: Normal rate, regular rhythm and normal heart sounds.   Pulmonary/Chest: Effort normal and breath sounds normal.  Abdominal: Soft. There is tenderness in the right lower  quadrant and left lower quadrant. There is no rebound, no guarding and no CVA tenderness.  Genitourinary:  External genitalia without lesions. Thick white cheesy discharge vaginal vault. Mild CMT, no adnexal mass palpated. Uterus slightly enlarged.   Musculoskeletal: Normal range of motion.  Neurological: She is alert and oriented to person, place, and time. No cranial nerve deficit.  Skin: Skin is warm and dry.   Psychiatric: She has a normal mood and affect. Her behavior is normal. Judgment and thought content normal.   Results for orders placed during the hospital encounter of 01/28/13 (from the past 24 hour(s))  POCT PREGNANCY, URINE     Status: None   Collection Time    01/28/13  9:17 AM      Result Value Range   Preg Test, Ur NEGATIVE  NEGATIVE  URINALYSIS, ROUTINE W REFLEX MICROSCOPIC     Status: Abnormal   Collection Time    01/28/13  9:19 AM      Result Value Range   Color, Urine YELLOW  YELLOW   APPearance CLEAR  CLEAR   Specific Gravity, Urine 1.025  1.005 - 1.030   pH 6.0  5.0 - 8.0   Glucose, UA NEGATIVE  NEGATIVE mg/dL   Hgb urine dipstick SMALL (*) NEGATIVE   Bilirubin Urine NEGATIVE  NEGATIVE   Ketones, ur NEGATIVE  NEGATIVE mg/dL   Protein, ur NEGATIVE  NEGATIVE mg/dL   Urobilinogen, UA 0.2  0.0 - 1.0 mg/dL   Nitrite NEGATIVE  NEGATIVE   Leukocytes, UA NEGATIVE  NEGATIVE  URINE MICROSCOPIC-ADD ON     Status: Abnormal   Collection Time    01/28/13  9:19 AM      Result Value Range   Squamous Epithelial / LPF FEW (*) RARE   RBC / HPF 3-6  <3 RBC/hpf   Bacteria, UA FEW (*) RARE  CBC WITH DIFFERENTIAL     Status: None   Collection Time    01/28/13  9:52 AM      Result Value Range   WBC 6.7  4.0 - 10.5 K/uL   RBC 3.98  3.87 - 5.11 MIL/uL   Hemoglobin 12.5  12.0 - 15.0 g/dL   HCT 21.3  08.6 - 57.8 %   MCV 93.7  78.0 - 100.0 fL   MCH 31.4  26.0 - 34.0 pg   MCHC 33.5  30.0 - 36.0 g/dL   RDW 46.9  62.9 - 52.8 %   Platelets 186  150 - 400 K/uL   Neutrophils Relative % 69  43 - 77 %   Neutro Abs 4.6  1.7 - 7.7 K/uL   Lymphocytes Relative 27  12 - 46 %   Lymphs Abs 1.8  0.7 - 4.0 K/uL   Monocytes Relative 3  3 - 12 %   Monocytes Absolute 0.2  0.1 - 1.0 K/uL   Eosinophils Relative 1  0 - 5 %   Eosinophils Absolute 0.1  0.0 - 0.7 K/uL   Basophils Relative 0  0 - 1 %   Basophils Absolute 0.0  0.0 - 0.1 K/uL  BASIC METABOLIC PANEL     Status: None   Collection Time     01/28/13  9:52 AM      Result Value Range   Sodium 137  135 - 145 mEq/L   Potassium 4.0  3.5 - 5.1 mEq/L   Chloride 101  96 - 112 mEq/L   CO2 27  19 - 32 mEq/L   Glucose, Bld 99  70 - 99 mg/dL   BUN 12  6 - 23 mg/dL   Creatinine, Ser 5.40  0.50 - 1.10 mg/dL   Calcium 8.9  8.4 - 98.1 mg/dL   GFR calc non Af Amer >90  >90 mL/min   GFR calc Af Amer >90  >90 mL/min  WET PREP, GENITAL     Status: Abnormal   Collection Time    01/28/13 10:43 AM      Result Value Range   Yeast Wet Prep HPF POC NONE SEEN  NONE SEEN   Trich, Wet Prep NONE SEEN  NONE SEEN   Clue Cells Wet Prep HPF POC MANY (*) NONE SEEN   WBC, Wet Prep HPF POC MANY (*) NONE SEEN     ED Course  Procedures (including critical care time) US Transvaginal Non-ob  01/28/2013   *RADIOLOGY REPORT*  Clinical Data: Lower abdominal pain and pelvic pain.  Prior history of ovarian cyst.  Surgical history includes D&C.  TRANSABDOMINAL AND TRANSVAGINAL ULTRASOUND OF PELVIS  Technique:  Both transabdominal and transvaginal ultrasound examinations of the pelvis were performed. Transabdominal technique was performed for global imaging of the pelvis including uterus, ovaries, adnexal regions, and pelvic cul-de-sac.  It was necessary to proceed with endovaginal exam following the transabdominal exam to visualize the ovaries and endometrium, as the bladder was incompletely distended.  Comparison:  Pelvic ultrasound 11/19/2012.  Findings:  Uterus: Normal in size measuring approximately 7.5 x 3.6 x 5.9 cm. At least two small uterine fibroids, one in the right uterine fundus measuring approximately 1.0 x 0.9 x 1.2 cm and the other in the right lateral body measuring approximately 0.9 cm maximally. Normal-appearing uterine cervix.  Endometrium: Normal and trilaminar in appearance measuring approximately 7 mm in thickness.  No endometrial fluid or mass.  Right ovary:  Normal in size appearance containing small follicular cysts, measuring approximately 2.3 x  1.7 x 1.9 cm.  Normal color Doppler flow within the ovary.  Left ovary: Normal in size and appearance containing small follicular cysts, measuring approximately 3.9 x 2.2 x 3.2 cm, the largest cyst approximating 1.6 cm.  Normal color Doppler flow within the ovary.  Other findings: Small amount of free fluid in the cul-de-sac.  IMPRESSION:  1.  No abnormalities identified to explain pelvic pain. 2.  2 small uterine fibroids, unchanged from the prior examination. 3.  Normal-appearing ovaries. 4.  Small amount of likely physiologic free fluid in the cul-de- sac.   Original Report Authenticated By: Hulan Saas, M.D.   US Pelvis Complete  01/28/2013   *RADIOLOGY REPORT*  Clinical Data: Lower abdominal pain and pelvic pain.  Prior history of ovarian cyst.  Surgical history includes D&C.  TRANSABDOMINAL AND TRANSVAGINAL ULTRASOUND OF PELVIS  Technique:  Both transabdominal and transvaginal ultrasound examinations of the pelvis were performed. Transabdominal technique was performed for global imaging of the pelvis including uterus, ovaries, adnexal regions, and pelvic cul-de-sac.  It was necessary to proceed with endovaginal exam following the transabdominal exam to visualize the ovaries and endometrium, as the bladder was incompletely distended.  Comparison:  Pelvic ultrasound 11/19/2012.  Findings:  Uterus: Normal in size measuring approximately 7.5 x 3.6 x 5.9 cm. At least two small uterine fibroids, one in the right uterine fundus measuring approximately 1.0 x 0.9 x 1.2 cm and the other in the right lateral body measuring approximately 0.9 cm maximally. Normal-appearing uterine cervix.  Endometrium: Normal and trilaminar in appearance measuring approximately 7 mm in thickness.  No endometrial fluid or  mass.  Right ovary:  Normal in size appearance containing small follicular cysts, measuring approximately 2.3 x 1.7 x 1.9 cm.  Normal color Doppler flow within the ovary.  Left ovary: Normal in size and appearance  containing small follicular cysts, measuring approximately 3.9 x 2.2 x 3.2 cm, the largest cyst approximating 1.6 cm.  Normal color Doppler flow within the ovary.  Other findings: Small amount of free fluid in the cul-de-sac.  IMPRESSION:  1.  No abnormalities identified to explain pelvic pain. 2.  2 small uterine fibroids, unchanged from the prior examination. 3.  Normal-appearing ovaries. 4.  Small amount of likely physiologic free fluid in the cul-de- sac.   Original Report Authenticated By: Hulan Saas, M.D.     MDM  32 y.o. female with pelvic pain.  Bacterial vaginosis, Monilia,  Uterine fibroids, small ovarian cysts Will treat with Diflucan, Flagyl and naprosyn. Patient to follow up with Dr. Emelda Fear for her pap smear.  I have reviewed this patient's vital signs, nurses notes, appropriate labs and imaging.  I have discussed findings and plan of care with the patient and she voices understanding.    Medication List    TAKE these medications       fluconazole 150 MG tablet  Commonly known as:  DIFLUCAN  Take 1 tablet (150 mg total) by mouth once.     metroNIDAZOLE 500 MG tablet  Commonly known as:  FLAGYL  Take 1 tablet (500 mg total) by mouth 2 (two) times daily.     naproxen sodium 550 MG tablet  Commonly known as:  ANAPROX DS  Take 1 tablet (550 mg total) by mouth 2 (two) times daily with a meal.      ASK your doctor about these medications       ibuprofen 200 MG tablet  Commonly known as:  ADVIL,MOTRIN  Take 400 mg by mouth every 6 (six) hours as needed for pain.               Janne Napoleon, [place] 01/28/13 [phone]

## 2013-01-28 NOTE — ED Notes (Signed)
States that she started having lower abdominal pain last night, denies any other symptoms.  States that she has a history of fibroids.  States that she is having a whitish vaginal discharge that started a few days ago.

## 2013-01-29 LAB — GC/CHLAMYDIA PROBE AMP
CT Probe RNA: NEGATIVE
GC Probe RNA: NEGATIVE

## 2013-01-29 NOTE — ED Provider Notes (Signed)
Medical screening examination/treatment/procedure(s) were performed by non-physician practitioner and as supervising physician I was immediately available for consultation/collaboration.   Adaya Garmany B. Annarose Ouellet, MD 01/29/13 1558 

## 2013-02-11 ENCOUNTER — Emergency Department (HOSPITAL_COMMUNITY)
Admission: EM | Admit: 2013-02-11 | Discharge: 2013-02-11 | Disposition: A | Discharge status: Home / Self Care | Payer: Medicaid Other | Attending: Emergency Medicine | Admitting: Emergency Medicine

## 2013-02-11 ENCOUNTER — Encounter: Payer: Self-pay | Admitting: *Deleted

## 2013-02-11 ENCOUNTER — Encounter (HOSPITAL_COMMUNITY): Payer: Self-pay

## 2013-02-11 DIAGNOSIS — Z8739 Personal history of other diseases of the musculoskeletal system and connective tissue: Secondary | ICD-10-CM | POA: Insufficient documentation

## 2013-02-11 DIAGNOSIS — M549 Dorsalgia, unspecified: Secondary | ICD-10-CM

## 2013-02-11 DIAGNOSIS — F17200 Nicotine dependence, unspecified, uncomplicated: Secondary | ICD-10-CM | POA: Insufficient documentation

## 2013-02-11 DIAGNOSIS — Z880 Allergy status to penicillin: Secondary | ICD-10-CM | POA: Insufficient documentation

## 2013-02-11 DIAGNOSIS — M545 Low back pain, unspecified: Secondary | ICD-10-CM | POA: Insufficient documentation

## 2013-02-11 DIAGNOSIS — Z8742 Personal history of other diseases of the female genital tract: Secondary | ICD-10-CM | POA: Insufficient documentation

## 2013-02-11 DIAGNOSIS — Z3202 Encounter for pregnancy test, result negative: Secondary | ICD-10-CM | POA: Insufficient documentation

## 2013-02-11 DIAGNOSIS — G8929 Other chronic pain: Secondary | ICD-10-CM | POA: Insufficient documentation

## 2013-02-11 DIAGNOSIS — F411 Generalized anxiety disorder: Secondary | ICD-10-CM | POA: Insufficient documentation

## 2013-02-11 HISTORY — DX: Dorsalgia, unspecified: M54.9

## 2013-02-11 HISTORY — DX: Other chronic pain: G89.29

## 2013-02-11 LAB — URINALYSIS, ROUTINE W REFLEX MICROSCOPIC
Bilirubin Urine: NEGATIVE
Ketones, ur: NEGATIVE mg/dL
Nitrite: NEGATIVE
pH: 8 (ref 5.0–8.0)

## 2013-02-11 LAB — URINE MICROSCOPIC-ADD ON

## 2013-02-11 MED ORDER — ONDANSETRON 8 MG PO TBDP
8.0000 mg | ORAL_TABLET | Freq: Once | ORAL | Status: AC
  Administered 2013-02-11: 8 mg via ORAL
  Filled 2013-02-11: qty 1

## 2013-02-11 MED ORDER — DIAZEPAM 5 MG/ML IJ SOLN
10.0000 mg | Freq: Once | INTRAMUSCULAR | Status: AC
  Administered 2013-02-11: 10 mg via INTRAVENOUS
  Filled 2013-02-11: qty 2

## 2013-02-11 MED ORDER — DIAZEPAM 10 MG PO TABS: 10.0000 mg | ORAL_TABLET | Freq: Three times a day (TID) | ORAL | Status: DC | PRN

## 2013-02-11 MED ORDER — HYDROMORPHONE HCL PF 2 MG/ML IJ SOLN
2.0000 mg | Freq: Once | INTRAMUSCULAR | Status: AC
  Administered 2013-02-11: 2 mg via INTRAMUSCULAR
  Filled 2013-02-11: qty 1

## 2013-02-11 MED ORDER — OXYCODONE-ACETAMINOPHEN 5-325 MG PO TABS: 1.0000 | ORAL_TABLET | ORAL | Status: DC | PRN

## 2013-02-11 NOTE — ED Notes (Signed)
Pt requesting pain med. edp aware 

## 2013-02-11 NOTE — ED Notes (Signed)
Pt arrived from [place] [facility] by [facility], stated that she is having back pain, and is unable to walk.  Arrived naked wrapped in sheets from motel

## 2013-02-11 NOTE — ED Provider Notes (Signed)
History  This chart was scribed for Flint Melter, MD by Bennett Scrape, ED Scribe. This patient was seen in room APA05/APA05 and the patient's care was started at 3:38 PM.  CSN: [id number]  Arrival date & time 02/11/13  1406   First MD Initiated Contact with Patient 02/11/13 1438      Chief Complaint  Patient presents with  . Back Pain     Patient is a 32 y.o. female presenting with back pain. The history is provided by the patient. No language interpreter was used.  Back Pain Location:  Lumbar spine Radiates to:  Does not radiate Pain severity:  Severe Onset quality:  Sudden Duration:  1 hour Timing:  Constant Chronicity:  New Context: not recent illness and not recent injury   Relieved by:  Nothing Worsened by:  Movement Ineffective treatments:  None tried Associated symptoms: no bladder incontinence, no bowel incontinence, no fever, no numbness and no perianal numbness     HPI Comments: ANAISHA MAGO is a 32 y.o. female brought in by ambulance with a h/o herniated discs, who presents to the Emergency Department complaining of sudden onset, non-changing, constant lower back pain described as spasms that started suddenly while at rest. She reports that she is unable to ambulate secondary to pain.  Pt has been f/u with ortho but it limited due to her medicaid coverage. She did go once a week for 3 months when she is supposed to be going 3 times a week for one year. She is also f/u for the symptoms with her PCP and has been prescribed prednisone in the past. She had a recent MRI that showed "cysts around my discs" in 2012. She denies urinary or bowel incontinence as associated symptoms. Pt is a current everyday smoker and occasional alcohol user.  Dr. Shon Baton is pt's ortho PCP is [facility]  Past Medical History  Diagnosis Date  . Herniated disc   . Anxiety   . Ovarian cyst   . Abnormal Pap smear   . Chronic back pain     Past Surgical  History  Procedure Laterality Date  . Dilation and cutterage      No family history on file.  History  Substance Use Topics  . Smoking status: Current Every Day Smoker    Types: Cigarettes  . Smokeless tobacco: Never Used  . Alcohol Use: Yes     Comment: occasionally  works in a warehouse  OB History   Grav Para Term Preterm Abortions TAB SAB Ect Mult Living   2 1 1              Review of Systems  Constitutional: Negative for fever.  Gastrointestinal: Negative for bowel incontinence.  Genitourinary: Negative for bladder incontinence.  Musculoskeletal: Positive for back pain.  Neurological: Negative for numbness.  All other systems reviewed and are negative.    Allergies  Penicillins  Home Medications   Current Outpatient Rx  Name  Route  Sig  Dispense  Refill  . fluconazole (DIFLUCAN) 150 MG tablet   Oral   Take 1 tablet (150 mg total) by mouth once.   1 tablet   0   . metroNIDAZOLE (FLAGYL) 500 MG tablet   Oral   Take 1 tablet (500 mg total) by mouth 2 (two) times daily.   14 tablet   0   . diazepam (VALIUM) 10 MG tablet   Oral   Take 1 tablet (10 mg total) by mouth every 8 (  eight) hours as needed (muscle spasm).   10 tablet   0   . oxyCODONE-acetaminophen (PERCOCET) 5-325 MG per tablet   Oral   Take 1 tablet by mouth every 4 (four) hours as needed for pain.   15 tablet   0     Triage Vitals: BP 111/68  Pulse 90  Temp(Src) 99.2 F (37.3 C) (Oral)  Resp 20  SpO2 100%  LMP 01/08/2013  Physical Exam  Nursing note and vitals reviewed. Constitutional: She is oriented to person, place, and time. She appears well-developed and well-nourished. No distress.  HENT:  Head: Normocephalic and atraumatic.  Right Ear: External ear normal.  Left Ear: External ear normal.  Eyes: Conjunctivae and EOM are normal. Pupils are equal, round, and reactive to light. Right eye exhibits no discharge. Left eye exhibits no discharge. No scleral icterus.  Neck:  Normal range of motion and phonation normal. Neck supple. No tracheal deviation present.  Cardiovascular: Normal rate, regular rhythm and intact distal pulses.   Pulmonary/Chest: Effort normal and breath sounds normal. No stridor. No respiratory distress. She has no wheezes. She has no rales. She exhibits no tenderness.  Abdominal: Soft. Bowel sounds are normal. She exhibits no distension. There is no tenderness. There is no rebound and no guarding.  Musculoskeletal: Normal range of motion. She exhibits no edema and no tenderness.  Bilateral paraspinal lumbar tenderness  Neurological: She is alert and oriented to person, place, and time. She has normal strength. No sensory deficit. Cranial nerve deficit:  no gross defecits noted. She exhibits normal muscle tone. She displays no seizure activity. Coordination normal.  Normal strength and sensation in bilateral legs  Skin: Skin is warm and dry. No rash noted.  Psychiatric: She has a normal mood and affect. Her behavior is normal. Judgment and thought content normal.    ED Course  Procedures (including critical care time)  Medications  HYDROmorphone (DILAUDID) injection 2 mg (2 mg Intramuscular Given 02/11/13 1525)  diazepam (VALIUM) injection 10 mg (10 mg Intravenous Given 02/11/13 1525)  ondansetron (ZOFRAN-ODT) disintegrating tablet 8 mg (8 mg Oral Given 02/11/13 1524)    DIAGNOSTIC STUDIES: Oxygen Saturation is 100% on room air, normal by my interpretation.    COORDINATION OF CARE: 3:18 PM-Discussed treatment plan which includes medications and UA with pt at bedside and pt agreed to plan.   Reevaluation at discharge: She is ambulatory and reports that her pain is tolerable. She is able to move from standing to sitting without great discomfort.  Labs Reviewed  URINALYSIS, ROUTINE W REFLEX MICROSCOPIC - Abnormal; Notable for the following:    Hgb urine dipstick SMALL (*)    All other components within normal limits  PREGNANCY, URINE  URINE  MICROSCOPIC-ADD ON      1. Back pain       MDM  Nonspecific recurrent to go back pain with history of degenerative joint disease of the lumbar spine. No red plaques indicative of cauda equina syndrome. No fever, and no evidence for UTI. Doubt metabolic instability, serious bacterial infection or impending vascular collapse; the patient is stable for discharge.   Nursing Notes Reviewed/ Care Coordinated, and agree without changes. Applicable Imaging Reviewed.  Interpretation of Laboratory Data incorporated into ED treatment   Plan: Home Medications- Percocet, Valium; Home Treatments- heat treatments; Recommended follow up- PCP, when necessary    I personally performed the services described in this documentation, which was scribed in my presence. The recorded information has been reviewed and is accurate.  Flint Melter, MD 02/11/13 [phone]

## 2013-02-12 ENCOUNTER — Encounter: Payer: Self-pay | Admitting: Obstetrics & Gynecology

## 2013-03-31 ENCOUNTER — Emergency Department (HOSPITAL_COMMUNITY)
Admission: EM | Admit: 2013-03-31 | Discharge: 2013-04-01 | Disposition: A | Discharge status: Home / Self Care | Payer: Medicaid Other | Attending: Emergency Medicine | Admitting: Emergency Medicine

## 2013-03-31 ENCOUNTER — Encounter (HOSPITAL_COMMUNITY): Payer: Self-pay

## 2013-03-31 DIAGNOSIS — G8929 Other chronic pain: Secondary | ICD-10-CM | POA: Insufficient documentation

## 2013-03-31 DIAGNOSIS — Z8739 Personal history of other diseases of the musculoskeletal system and connective tissue: Secondary | ICD-10-CM | POA: Insufficient documentation

## 2013-03-31 DIAGNOSIS — Z880 Allergy status to penicillin: Secondary | ICD-10-CM | POA: Insufficient documentation

## 2013-03-31 DIAGNOSIS — M542 Cervicalgia: Secondary | ICD-10-CM

## 2013-03-31 DIAGNOSIS — R079 Chest pain, unspecified: Secondary | ICD-10-CM | POA: Insufficient documentation

## 2013-03-31 DIAGNOSIS — Z8742 Personal history of other diseases of the female genital tract: Secondary | ICD-10-CM | POA: Insufficient documentation

## 2013-03-31 DIAGNOSIS — F17200 Nicotine dependence, unspecified, uncomplicated: Secondary | ICD-10-CM | POA: Insufficient documentation

## 2013-03-31 DIAGNOSIS — F411 Generalized anxiety disorder: Secondary | ICD-10-CM | POA: Insufficient documentation

## 2013-03-31 DIAGNOSIS — R42 Dizziness and giddiness: Secondary | ICD-10-CM | POA: Insufficient documentation

## 2013-03-31 NOTE — ED Notes (Signed)
Pt states she has been having intermittent sharp pains since Sunday, states it sometimes feels like it takes her breath away.

## 2013-03-31 NOTE — ED Notes (Signed)
Pt c/o mid center chest pain described as sharp that radiates to right shoulder blade that has been intermittent since Sunday, pain is associated with dizziness and sob, had an episode similar a year ago with no diagnosis, pain started this evening while laying down.

## 2013-03-31 NOTE — ED Notes (Signed)
Pt reports that she has been under a lot of stress lately, at first thought the chest pain was related to her anxiety attacks. Denies any SI/HI

## 2013-04-01 MED ORDER — IBUPROFEN 800 MG PO TABS
800.0000 mg | ORAL_TABLET | Freq: Once | ORAL | Status: AC
  Administered 2013-04-01: 800 mg via ORAL
  Filled 2013-04-01: qty 1

## 2013-04-01 MED ORDER — IBUPROFEN 800 MG PO TABS: 800.0000 mg | ORAL_TABLET | Freq: Three times a day (TID) | ORAL | Status: DC

## 2013-04-01 NOTE — ED Provider Notes (Signed)
History    CSN: [id number] Arrival date & time 03/31/13  2253  First MD Initiated Contact with Patient 04/01/13 0251     Chief Complaint  Patient presents with  . Chest Pain  . Dizziness   (Consider location/radiation/quality/duration/timing/severity/associated sxs/prior Treatment) HPI HPI Comments: Yesenia Gates is a 32 y.o. female who presents to the Emergency Department complaining of sharp stabbing pains to her right chest since Sunday that radiate to her neck and across her shoulder. Sometimes it is sharp enough that she feels she cannot catch her breath. She has taken no medicines. She denies fever, chills, cough, nausea, vomiting.  PCP Mary-Margaret Martin  Past Medical History  Diagnosis Date  . Herniated disc   . Anxiety   . Ovarian cyst   . Abnormal Pap smear   . Chronic back pain    Past Surgical History  Procedure Laterality Date  . Dilation and cutterage     No family history on file. History  Substance Use Topics  . Smoking status: Current Every Day Smoker    Types: Cigarettes  . Smokeless tobacco: Never Used  . Alcohol Use: Yes     Comment: occasionally   OB History   Grav Para Term Preterm Abortions TAB SAB Ect Mult Living   2 1 1            Review of Systems  Constitutional: Negative for fever.       10  Systems reviewed and are negative for acute change except as noted in the HPI.  HENT: Negative for congestion.   Eyes: Negative for discharge and redness.  Respiratory: Negative for cough and shortness of breath.   Cardiovascular: Positive for chest pain.  Gastrointestinal: Negative for vomiting and abdominal pain.  Musculoskeletal: Negative for back pain.  Skin: Negative for rash.  Neurological: Negative for syncope, numbness and headaches.  Psychiatric/Behavioral:       No behavior change.    Allergies  Penicillins  Home Medications   Current Outpatient Rx  Name  Route  Sig  Dispense  Refill  . diazepam (VALIUM) 10 MG  tablet   Oral   Take 1 tablet (10 mg total) by mouth every 8 (eight) hours as needed (muscle spasm).   10 tablet   0   . fluconazole (DIFLUCAN) 150 MG tablet   Oral   Take 1 tablet (150 mg total) by mouth once.   1 tablet   0   . ibuprofen (ADVIL,MOTRIN) 800 MG tablet   Oral   Take 1 tablet (800 mg total) by mouth 3 (three) times daily.   21 tablet   0   . metroNIDAZOLE (FLAGYL) 500 MG tablet   Oral   Take 1 tablet (500 mg total) by mouth 2 (two) times daily.   14 tablet   0   . oxyCODONE-acetaminophen (PERCOCET) 5-325 MG per tablet   Oral   Take 1 tablet by mouth every 4 (four) hours as needed for pain.   15 tablet   0    BP 110/67  Pulse 100  Temp(Src) 98.2 F (36.8 C) (Oral)  Resp 17  Ht 5' 6.5" (1.689 m)  Wt 200 lb (90.719 kg)  BMI 31.8 kg/m2  SpO2 100%  LMP 03/30/2013 Physical Exam  Nursing note and vitals reviewed. Constitutional: She appears well-developed and well-nourished.  Awake, alert, nontoxic appearance.  HENT:  Head: Normocephalic and atraumatic.  Eyes: EOM are normal. Pupils are equal, round, and reactive to light.  Neck: Normal  range of motion. Neck supple.  Cardiovascular: Normal rate and intact distal pulses.   Pulmonary/Chest: Effort normal and breath sounds normal. She exhibits no tenderness.  Abdominal: Soft. Bowel sounds are normal. There is no tenderness. There is no rebound.  Musculoskeletal: She exhibits no tenderness.  Baseline ROM, no obvious new focal weakness.  Neurological:  Mental status and motor strength appears baseline for patient and situation.  Skin: No rash noted.  Psychiatric: She has a normal mood and affect.    ED Course  Procedures (including critical care time)  Date: 03/31/2013  2318  Rate: 90  Rhythm: normal sinus rhythm  QRS Axis:rightward  Intervals: normal  ST/T Wave abnormalities: normal  Conduction Disutrbances: none  Narrative Interpretation: unremarkable    1. Chest pain   2. Neck pain      MDM  Patient presents with sharp chest and neck pain that is intermittent since Sunday. She is not aware of any activity that may have left her with this pain. Pain is present with movement and sometimes comes on its own as a stabbing pain. Given ibuprofen. Encouraged to follow up with PCP. Pt stable in ED with no significant deterioration in condition.The patient appears reasonably screened and/or stabilized for discharge and I doubt any other medical condition or other [facility] requiring further screening, evaluation, or treatment in the ED at this time prior to discharge.  MDM Reviewed: nursing note and vitals Interpretation: ECG     Nicoletta Dress. Colon Branch, MD 04/01/13 [phone]

## 2013-04-01 NOTE — ED Notes (Signed)
Pt resting quietly in room.  No distress noted at present time.

## 2013-04-28 ENCOUNTER — Ambulatory Visit (INDEPENDENT_AMBULATORY_CARE_PROVIDER_SITE_OTHER): Payer: Medicaid Other | Admitting: Nurse Practitioner

## 2013-04-28 ENCOUNTER — Encounter: Payer: Self-pay | Admitting: Nurse Practitioner

## 2013-04-28 VITALS — BP 107/71 | HR 81 | Temp 98.0°F | Ht 67.0 in | Wt 181.0 lb

## 2013-04-28 DIAGNOSIS — N760 Acute vaginitis: Secondary | ICD-10-CM

## 2013-04-28 DIAGNOSIS — B9689 Other specified bacterial agents as the cause of diseases classified elsewhere: Secondary | ICD-10-CM

## 2013-04-28 DIAGNOSIS — A499 Bacterial infection, unspecified: Secondary | ICD-10-CM

## 2013-04-28 DIAGNOSIS — N898 Other specified noninflammatory disorders of vagina: Secondary | ICD-10-CM

## 2013-04-28 MED ORDER — METRONIDAZOLE 500 MG PO TABS: 500.0000 mg | ORAL_TABLET | Freq: Two times a day (BID) | ORAL | Status: DC

## 2013-04-28 MED ORDER — FLUCONAZOLE 150 MG PO TABS: 150.0000 mg | ORAL_TABLET | Freq: Once | ORAL | Status: DC

## 2013-04-28 NOTE — Progress Notes (Signed)
  Subjective:    Patient ID: Yesenia Gates, female    DOB: 02-14-1981, 32 y.o.   MRN: [id number]  HPI  Patient actually had appointment for PAP today but is bleeding to heavy to have done- She is c/o a vaginal discharge taht has a fishy odor- gets bacterial infections frequently.     Review of Systems  All other systems reviewed and are negative.       Objective:   Physical Exam  Constitutional: She appears well-developed and well-nourished.  Cardiovascular: Normal rate and normal heart sounds.   Pulmonary/Chest: Effort normal and breath sounds normal.  Genitourinary: Vaginal discharge found.  Looking for clue cells in urine     BP 107/71  Pulse 81  Temp(Src) 98 F (36.7 C) (Oral)  Ht 5\' 7"  (1.702 m)  Wt 181 lb (82.101 kg)  BMI 28.34 kg/m2  LMP 03/30/2013      Assessment & Plan:  1. Vaginal discharge  - POCT urinalysis dipstick - POCT UA - Microscopic Only  2. Bacterial vaginosis Avoid bubble baths Cotton crotch panties - metroNIDAZOLE (FLAGYL) 500 MG tablet; Take 1 tablet (500 mg total) by mouth 2 (two) times daily.  Dispense: 14 tablet; Refill: 0 - fluconazole (DIFLUCAN) 150 MG tablet; Take 1 tablet (150 mg total) by mouth once.  Dispense: 1 tablet; Refill: 0  Mary-Margaret Daphine Deutscher, FNP

## 2013-04-28 NOTE — Patient Instructions (Signed)
Bacterial Vaginosis Bacterial vaginosis (BV) is a vaginal infection where the normal balance of bacteria in the vagina is disrupted. The normal balance is then replaced by an overgrowth of certain bacteria. There are several different kinds of bacteria that can cause BV. BV is the most common vaginal infection in women of childbearing age. CAUSES   The cause of BV is not fully understood. BV develops when there is an increase or imbalance of harmful bacteria.  Some activities or behaviors can upset the normal balance of bacteria in the vagina and put women at increased risk including:  Having a new sex partner or multiple sex partners.  Douching.  Using an intrauterine device (IUD) for contraception.  It is not clear what role sexual activity plays in the development of BV. However, women that have never had sexual intercourse are rarely infected with BV. Women do not get BV from toilet seats, bedding, swimming pools or from touching objects around them.  SYMPTOMS   Grey vaginal discharge.  A fish-like odor with discharge, especially after sexual intercourse.  Itching or burning of the vagina and vulva.  Burning or pain with urination.  Some women have no signs or symptoms at all. DIAGNOSIS  Your caregiver must examine the vagina for signs of BV. Your caregiver will perform lab tests and look at the sample of vaginal fluid through a microscope. They will look for bacteria and abnormal cells (clue cells), a pH test higher than 4.5, and a positive amine test all associated with BV.  RISKS AND COMPLICATIONS   Pelvic inflammatory disease (PID).  Infections following gynecology surgery.  Developing HIV.  Developing herpes virus. TREATMENT  Sometimes BV will clear up without treatment. However, all women with symptoms of BV should be treated to avoid complications, especially if gynecology surgery is planned. Female partners generally do not need to be treated. However, BV may spread  between female sex partners so treatment is helpful in preventing a recurrence of BV.   BV may be treated with antibiotics. The antibiotics come in either pill or vaginal cream forms. Either can be used with nonpregnant or pregnant women, but the recommended dosages differ. These antibiotics are not harmful to the baby.  BV can recur after treatment. If this happens, a second round of antibiotics will often be prescribed.  Treatment is important for pregnant women. If not treated, BV can cause a premature delivery, especially for a pregnant woman who had a premature birth in the past. All pregnant women who have symptoms of BV should be checked and treated.  For chronic reoccurrence of BV, treatment with a type of prescribed gel vaginally twice a week is helpful. HOME CARE INSTRUCTIONS   Finish all medication as directed by your caregiver.  Do not have sex until treatment is completed.  Tell your sexual partner that you have a vaginal infection. They should see their caregiver and be treated if they have problems, such as a mild rash or itching.  Practice safe sex. Use condoms. Only have 1 sex partner. PREVENTION  Basic prevention steps can help reduce the risk of upsetting the natural balance of bacteria in the vagina and developing BV:  Do not have sexual intercourse (be abstinent).  Do not douche.  Use all of the medicine prescribed for treatment of BV, even if the signs and symptoms go away.  Tell your sex partner if you have BV. That way, they can be treated, if needed, to prevent reoccurrence. SEEK MEDICAL CARE IF:     Your symptoms are not improving after 3 days of treatment.  You have increased discharge, pain, or fever. MAKE SURE YOU:   Understand these instructions.  Will watch your condition.  Will get help right away if you are not doing well or get worse. FOR MORE INFORMATION  Division of STD Prevention (DSTDP), Centers for Disease Control and Prevention:  www.cdc.gov/std American Social Health Association (ASHA): www.ashastd.org  Document Released: 08/26/2005 Document Revised: 11/18/2011 Document Reviewed: 02/16/2009 Exit[employer] Patient Information 2014 [employer].  

## 2013-06-01 ENCOUNTER — Encounter (HOSPITAL_COMMUNITY): Payer: Self-pay

## 2013-06-01 ENCOUNTER — Emergency Department (HOSPITAL_COMMUNITY)
Admission: EM | Admit: 2013-06-01 | Discharge: 2013-06-01 | Disposition: A | Discharge status: Home / Self Care | Payer: Medicaid Other | Attending: Emergency Medicine | Admitting: Emergency Medicine

## 2013-06-01 DIAGNOSIS — F17200 Nicotine dependence, unspecified, uncomplicated: Secondary | ICD-10-CM | POA: Insufficient documentation

## 2013-06-01 DIAGNOSIS — Z8742 Personal history of other diseases of the female genital tract: Secondary | ICD-10-CM | POA: Insufficient documentation

## 2013-06-01 DIAGNOSIS — G8929 Other chronic pain: Secondary | ICD-10-CM | POA: Insufficient documentation

## 2013-06-01 DIAGNOSIS — Z3202 Encounter for pregnancy test, result negative: Secondary | ICD-10-CM | POA: Insufficient documentation

## 2013-06-01 DIAGNOSIS — N898 Other specified noninflammatory disorders of vagina: Secondary | ICD-10-CM | POA: Insufficient documentation

## 2013-06-01 DIAGNOSIS — N760 Acute vaginitis: Secondary | ICD-10-CM | POA: Insufficient documentation

## 2013-06-01 DIAGNOSIS — Z880 Allergy status to penicillin: Secondary | ICD-10-CM | POA: Insufficient documentation

## 2013-06-01 DIAGNOSIS — B9689 Other specified bacterial agents as the cause of diseases classified elsewhere: Secondary | ICD-10-CM | POA: Insufficient documentation

## 2013-06-01 DIAGNOSIS — Z8739 Personal history of other diseases of the musculoskeletal system and connective tissue: Secondary | ICD-10-CM | POA: Insufficient documentation

## 2013-06-01 DIAGNOSIS — Z8659 Personal history of other mental and behavioral disorders: Secondary | ICD-10-CM | POA: Insufficient documentation

## 2013-06-01 DIAGNOSIS — IMO0002 Reserved for concepts with insufficient information to code with codable children: Secondary | ICD-10-CM | POA: Insufficient documentation

## 2013-06-01 DIAGNOSIS — A499 Bacterial infection, unspecified: Secondary | ICD-10-CM | POA: Insufficient documentation

## 2013-06-01 DIAGNOSIS — L509 Urticaria, unspecified: Secondary | ICD-10-CM | POA: Insufficient documentation

## 2013-06-01 LAB — URINE MICROSCOPIC-ADD ON

## 2013-06-01 LAB — RPR: RPR Ser Ql: NONREACTIVE

## 2013-06-01 LAB — URINALYSIS, ROUTINE W REFLEX MICROSCOPIC
Ketones, ur: NEGATIVE mg/dL
Leukocytes, UA: NEGATIVE
Nitrite: NEGATIVE
Urobilinogen, UA: 0.2 mg/dL (ref 0.0–1.0)
pH: 6 (ref 5.0–8.0)

## 2013-06-01 LAB — HIV ANTIBODY (ROUTINE TESTING W REFLEX): HIV: NONREACTIVE

## 2013-06-01 LAB — WET PREP, GENITAL: Yeast Wet Prep HPF POC: NONE SEEN

## 2013-06-01 MED ORDER — DIPHENHYDRAMINE HCL 25 MG PO TABS: 25.0000 mg | ORAL_TABLET | Freq: Four times a day (QID) | ORAL | Status: DC | PRN

## 2013-06-01 MED ORDER — METRONIDAZOLE 500 MG PO TABS: 500.0000 mg | ORAL_TABLET | Freq: Two times a day (BID) | ORAL | Status: DC

## 2013-06-01 MED ORDER — PREDNISONE (PAK) 10 MG PO TABS: 10.0000 mg | ORAL_TABLET | Freq: Every day | ORAL | Status: DC

## 2013-06-01 NOTE — ED Notes (Signed)
Pt reports woke up with rash on legs and abd this am.  Denies pain, c/o itching.

## 2013-06-02 LAB — GC/CHLAMYDIA PROBE AMP
CT Probe RNA: NEGATIVE
GC Probe RNA: NEGATIVE

## 2013-06-03 NOTE — ED Provider Notes (Signed)
CSN: [id number]     Arrival date & time 06/01/13  4098 History   First MD Initiated Contact with Patient 06/01/13 0919     Chief Complaint  Patient presents with  . Rash   (Consider location/radiation/quality/duration/timing/severity/associated sxs/prior Treatment) HPI Comments: Yesenia Gates is a 32 y.o. Female presenting with 2 concerns,  The first being an itchy rash which she woke with this am on her anterior legs and medial thighs and across her abdomen. The rash has been moving,  Stating she had some raised itchy patches that have resolved,  But then popped up in another location.  She denies any new foods, exposures to chemicals, new clothing or soaps or lotions, and no new medications. She has found no alleviators.  She denies cough, shortness of breath, mouth or facial swelling.    Secondly, she has had a slight increased vaginal discharge and fishy odor which she recognizes for the past week.  She has a history of bacterial vaginosis which she believes has returned. She denies fevers, chills, abdominal or pelvic pain.  She is not currently sexually active.  Her lmp was 1 week ago and normal.     The history is provided by the patient.    Past Medical History  Diagnosis Date  . Herniated disc   . Anxiety   . Ovarian cyst   . Abnormal Pap smear   . Chronic back pain    Past Surgical History  Procedure Laterality Date  . Dilation and cutterage     No family history on file. History  Substance Use Topics  . Smoking status: Current Every Day Smoker    Types: Cigarettes  . Smokeless tobacco: Never Used  . Alcohol Use: Yes     Comment: occasionally   OB History   Grav Para Term Preterm Abortions TAB SAB Ect Mult Living   2 1 1             Review of Systems  Constitutional: Negative for fever.  HENT: Negative for congestion, sore throat, facial swelling and neck pain.   Eyes: Negative.   Respiratory: Negative for chest tightness, shortness of breath and  stridor.   Cardiovascular: Negative for chest pain.  Gastrointestinal: Negative for nausea and abdominal pain.  Genitourinary: Positive for vaginal discharge. Negative for dysuria, urgency, vaginal pain and pelvic pain.  Musculoskeletal: Negative for joint swelling and arthralgias.  Skin: Positive for rash. Negative for wound.  Neurological: Negative for dizziness, weakness, light-headedness, numbness and headaches.  Psychiatric/Behavioral: Negative.     Allergies  Penicillins  Home Medications   Current Outpatient Rx  Name  Route  Sig  Dispense  Refill  . diphenhydrAMINE (BENADRYL) 25 MG tablet   Oral   Take 1 tablet (25 mg total) by mouth every 6 (six) hours as needed for itching (and rash).   15 tablet   0   . metroNIDAZOLE (FLAGYL) 500 MG tablet   Oral   Take 1 tablet (500 mg total) by mouth 2 (two) times daily.   14 tablet   0   . predniSONE (STERAPRED UNI-PAK) 10 MG tablet   Oral   Take 1 tablet (10 mg total) by mouth daily. 6, 5, 4, 3, 2 then 1 tablet by mouth daily for 6 days total.   21 tablet   0    BP 117/52  Pulse 94  Temp(Src) 98.4 F (36.9 C) (Oral)  Resp 20  Ht 5\' 6"  (1.676 m)  Wt 180 lb (81.647 kg)  BMI 29.07 kg/m2  SpO2 100%  LMP 05/25/2013 Physical Exam  Nursing note and vitals reviewed. Constitutional: She appears well-developed and well-nourished.  HENT:  Head: Normocephalic and atraumatic.  Mouth/Throat: Uvula is midline and oropharynx is clear and moist. No posterior oropharyngeal edema or posterior oropharyngeal erythema.  Neck: Normal range of motion.  Cardiovascular: Normal rate, regular rhythm, normal heart sounds and intact distal pulses.   Pulmonary/Chest: Effort normal and breath sounds normal. She has no decreased breath sounds. She has no wheezes.  Abdominal: Soft. Bowel sounds are normal. She exhibits no distension. There is no tenderness. There is no guarding.  Genitourinary: Uterus normal. Cervix exhibits no motion tenderness.  Right adnexum displays no mass and no tenderness. Left adnexum displays no mass and no tenderness. No tenderness around the vagina. Vaginal discharge found.  Musculoskeletal: Normal range of motion.  Neurological: She is alert.  Skin: Skin is warm and dry. Rash noted. Rash is urticarial.  Urticarial lesions bilateral medial and anterior thighs,  Few patches on lateral hips and across mid abdomen.  Psychiatric: She has a normal mood and affect.    ED Course  Procedures (including critical care time) Labs Review Labs Reviewed  WET PREP, GENITAL - Abnormal; Notable for the following:    Clue Cells Wet Prep HPF POC FEW (*)    All other components within normal limits  URINALYSIS, ROUTINE W REFLEX MICROSCOPIC - Abnormal; Notable for the following:    Specific Gravity, Urine >1.030 (*)    Hgb urine dipstick MODERATE (*)    All other components within normal limits  URINE MICROSCOPIC-ADD ON - Abnormal; Notable for the following:    Squamous Epithelial / LPF FEW (*)    All other components within normal limits  GC/CHLAMYDIA PROBE AMP  RPR  HIV ANTIBODY (ROUTINE TESTING)  POCT PREGNANCY, URINE   Imaging Review No results found.  MDM   1. Localized hives   2. Bacterial vaginosis    Hives of unclear etiology, patient denies any new exposures. She was placed on prednisone, benadryl.  Prescribed flagyl for bv.  Pt aware that cultures are pending.  Encouraged f/u with pcp or return here for worsened or persistent sx.  The patient appears reasonably screened and/or stabilized for discharge and I doubt any other medical condition or other [facility] requiring further screening, evaluation, or treatment in the ED at this time prior to discharge.     Burgess Amor, PA-C 06/03/13 2135

## 2013-06-07 NOTE — ED Provider Notes (Signed)
Medical screening examination/treatment/procedure(s) were performed by non-physician practitioner and as supervising physician I was immediately available for consultation/collaboration.  Freida Nebel N Earle Troiano, DO 06/07/13 0654 

## 2013-06-17 ENCOUNTER — Encounter (HOSPITAL_COMMUNITY): Payer: Self-pay | Admitting: Emergency Medicine

## 2013-06-17 ENCOUNTER — Emergency Department (HOSPITAL_COMMUNITY): Payer: Medicaid Other

## 2013-06-17 ENCOUNTER — Emergency Department (HOSPITAL_COMMUNITY)
Admission: EM | Admit: 2013-06-17 | Discharge: 2013-06-17 | Disposition: A | Discharge status: Home / Self Care | Payer: Medicaid Other | Attending: Emergency Medicine | Admitting: Emergency Medicine

## 2013-06-17 DIAGNOSIS — Z792 Long term (current) use of antibiotics: Secondary | ICD-10-CM | POA: Insufficient documentation

## 2013-06-17 DIAGNOSIS — F17200 Nicotine dependence, unspecified, uncomplicated: Secondary | ICD-10-CM | POA: Insufficient documentation

## 2013-06-17 DIAGNOSIS — Z8659 Personal history of other mental and behavioral disorders: Secondary | ICD-10-CM | POA: Insufficient documentation

## 2013-06-17 DIAGNOSIS — N83209 Unspecified ovarian cyst, unspecified side: Secondary | ICD-10-CM | POA: Insufficient documentation

## 2013-06-17 DIAGNOSIS — N83201 Unspecified ovarian cyst, right side: Secondary | ICD-10-CM

## 2013-06-17 DIAGNOSIS — Z8739 Personal history of other diseases of the musculoskeletal system and connective tissue: Secondary | ICD-10-CM | POA: Insufficient documentation

## 2013-06-17 DIAGNOSIS — Z3202 Encounter for pregnancy test, result negative: Secondary | ICD-10-CM | POA: Insufficient documentation

## 2013-06-17 DIAGNOSIS — Z880 Allergy status to penicillin: Secondary | ICD-10-CM | POA: Insufficient documentation

## 2013-06-17 LAB — URINALYSIS, ROUTINE W REFLEX MICROSCOPIC
Bilirubin Urine: NEGATIVE
Glucose, UA: NEGATIVE mg/dL
Specific Gravity, Urine: 1.025 (ref 1.005–1.030)
Urobilinogen, UA: 0.2 mg/dL (ref 0.0–1.0)
pH: 6.5 (ref 5.0–8.0)

## 2013-06-17 LAB — PREGNANCY, URINE: Preg Test, Ur: NEGATIVE

## 2013-06-17 LAB — WET PREP, GENITAL
Clue Cells Wet Prep HPF POC: NONE SEEN
Trich, Wet Prep: NONE SEEN
Yeast Wet Prep HPF POC: NONE SEEN

## 2013-06-17 MED ORDER — NAPROXEN 500 MG PO TABS: 500.0000 mg | ORAL_TABLET | Freq: Two times a day (BID) | ORAL | Status: DC

## 2013-06-17 NOTE — ED Notes (Signed)
Low abd pain , most on RLQ, but at times [facility] NVD.  No urinary sx

## 2013-06-17 NOTE — ED Notes (Signed)
Pt returned to retrieve her discharge papers and prescriptions.  Pt signed discharge.and rated pain

## 2013-06-17 NOTE — ED Provider Notes (Signed)
CSN: [id number]     Arrival date & time 06/17/13  1407 History   This chart was scribed for Donnetta Hutching, MD, by Yevette Edwards, ED Scribe. This patient was seen in room APA12/AP[facility]12 and the patient's care was started at 2:28 PM. First MD Initiated Contact with Patient 06/17/13 1419     Chief Complaint  Patient presents with  . Abdominal Pain   (Consider location/radiation/quality/duration/timing/severity/associated sxs/prior Treatment) HPI HPI Comments: Yesenia Gates is a 32 y.o. female, with a h/o an ovarian cyst, who presents to the Emergency Department complaining of RLQ pain which has been occurring for five days. She reports the the pain occasionally radiates to her LLQ. She states that the pain is increased with movement as well as being recumbent. She denies any vaginal pain, vaginal discharge, or dysuria. She also denies a h/o health issues. The pt's last menstruation was September 15; she reports that it was normal. The pt is a daily smoker. She is sexually active to   Past Medical History  Diagnosis Date  . Herniated disc   . Anxiety   . Ovarian cyst   . Abnormal Pap smear   . Chronic back pain    Past Surgical History  Procedure Laterality Date  . Dilation and cutterage     History reviewed. No pertinent family history. History  Substance Use Topics  . Smoking status: Current Every Day Smoker    Types: Cigarettes  . Smokeless tobacco: Never Used  . Alcohol Use: Yes     Comment: occasionally   OB History   Grav Para Term Preterm Abortions TAB SAB Ect Mult Living   2 1 1             Review of Systems  Constitutional: Negative for fever.  Gastrointestinal: Positive for abdominal pain.  Genitourinary: Negative for dysuria, vaginal discharge and vaginal pain.  All other systems reviewed and are negative.    Allergies  Penicillins  Home Medications   Current Outpatient Rx  Name  Route  Sig  Dispense  Refill  . diphenhydrAMINE (BENADRYL) 25 MG tablet    Oral   Take 1 tablet (25 mg total) by mouth every 6 (six) hours as needed for itching (and rash).   15 tablet   0   . metroNIDAZOLE (FLAGYL) 500 MG tablet   Oral   Take 1 tablet (500 mg total) by mouth 2 (two) times daily.   14 tablet   0   . predniSONE (STERAPRED UNI-PAK) 10 MG tablet   Oral   Take 1 tablet (10 mg total) by mouth daily. 6, 5, 4, 3, 2 then 1 tablet by mouth daily for 6 days total.   21 tablet   0    BP 120/70  Pulse 95  Temp(Src) 98.7 F (37.1 C) (Oral)  Resp 20  Ht 5\' 7"  (1.702 m)  Wt 200 lb (90.719 kg)  BMI 31.32 kg/m2  SpO2 100%  LMP 05/25/2013  Physical Exam  Nursing note and vitals reviewed. Constitutional: She is oriented to person, place, and time. She appears well-developed and well-nourished.  HENT:  Head: Normocephalic and atraumatic.  Eyes: Conjunctivae and EOM are normal. Pupils are equal, round, and reactive to light.  Neck: Normal range of motion. Neck supple.  Cardiovascular: Normal rate, regular rhythm and normal heart sounds.   Pulmonary/Chest: Effort normal and breath sounds normal.  Abdominal: Soft. Bowel sounds are normal. There is tenderness.  Minimally tender to RLQ.   Genitourinary:  Normal  external genitalia. cervix nontender. Adnexa nontender and without masses. no cervical discharge   Musculoskeletal: Normal range of motion.  Neurological: She is alert and oriented to person, place, and time.  Skin: Skin is warm and dry.  Psychiatric: She has a normal mood and affect.    ED Course  Procedures (including critical care time)  DIAGNOSTIC STUDIES:    COORDINATION OF CARE:  2:33 PM- Discussed treatment plan with patient which includes a [place], pelvic exam, and UA, and the patient agreed to the plan.   Labs Review Labs Reviewed  URINALYSIS, ROUTINE W REFLEX MICROSCOPIC - Abnormal; Notable for the following:    Hgb urine dipstick TRACE (*)    All other components within normal limits  URINE MICROSCOPIC-ADD ON - Abnormal;  Notable for the following:    Squamous Epithelial / LPF MANY (*)    All other components within normal limits  WET PREP, GENITAL  GC/CHLAMYDIA PROBE AMP  PREGNANCY, URINE   Imaging Review No results found. US Transvaginal Non-ob  06/17/2013   CLINICAL DATA:  Right lower quadrant pain.  EXAM: TRANSABDOMINAL AND TRANSVAGINAL ULTRASOUND OF PELVIS  TECHNIQUE: Both transabdominal and transvaginal ultrasound examinations of the pelvis were performed. Transabdominal technique was performed for global imaging of the pelvis including uterus, ovaries, adnexal regions, and pelvic cul-de-sac. It was necessary to proceed with endovaginal exam following the transabdominal exam to visualize the endometrium and ovaries.  COMPARISON:  Pelvic ultrasound 01/28/2013  FINDINGS: Uterus  Measurements: The uterus measures 9.4 x 5.3 x 6.7 cm. The uterus is anteverted. Within the anterior aspect of the uterus there is an intramural fibroid which measures 1.2 x 0.8 x 1.0 cm. Within the posterior aspect of the uterus mid body there is another intramural fibroid which measures 1.0 x 0.7 x 0.8 cm.  Endometrium  Thickness: Measures 10 mm.  No focal abnormality visualized.  Right ovary  Measurements: Measures 3.9 x 2.1 x 2.4 cm. Within the right ovary there is a 2.2 x 2.2 x 2.2 cm isoechoic mass. Small amount of free fluid surrounding the right ovary.  Left ovary  Measurements: Measures 3.1 x 2.3 x 1.7 cm. Normal appearance/no adnexal mass.  Other findings  Small amount of free fluid around the right ovary.  IMPRESSION: 1. Right ovary is normal in size. There is an isoechoic mass within the right ovary measuring 2.2 cm which may represent but is not definitive for a hemorrhagic cyst. A followup ultrasound in 6- 8 weeks is recommended to evaluate for interval change or resolution. 2. Small amount of free fluid around the right ovary. 3. Fibroid uterus   Electronically Signed   By: Annia Belt M.D.   On: 06/17/2013 16:31   US Pelvis  Complete  06/17/2013   CLINICAL DATA:  Right lower quadrant pain.  EXAM: TRANSABDOMINAL AND TRANSVAGINAL ULTRASOUND OF PELVIS  TECHNIQUE: Both transabdominal and transvaginal ultrasound examinations of the pelvis were performed. Transabdominal technique was performed for global imaging of the pelvis including uterus, ovaries, adnexal regions, and pelvic cul-de-sac. It was necessary to proceed with endovaginal exam following the transabdominal exam to visualize the endometrium and ovaries.  COMPARISON:  Pelvic ultrasound 01/28/2013  FINDINGS: Uterus  Measurements: The uterus measures 9.4 x 5.3 x 6.7 cm. The uterus is anteverted. Within the anterior aspect of the uterus there is an intramural fibroid which measures 1.2 x 0.8 x 1.0 cm. Within the posterior aspect of the uterus mid body there is another intramural fibroid which measures 1.0 x 0.7  x 0.8 cm.  Endometrium  Thickness: Measures 10 mm.  No focal abnormality visualized.  Right ovary  Measurements: Measures 3.9 x 2.1 x 2.4 cm. Within the right ovary there is a 2.2 x 2.2 x 2.2 cm isoechoic mass. Small amount of free fluid surrounding the right ovary.  Left ovary  Measurements: Measures 3.1 x 2.3 x 1.7 cm. Normal appearance/no adnexal mass.  Other findings  Small amount of free fluid around the right ovary.  IMPRESSION: 1. Right ovary is normal in size. There is an isoechoic mass within the right ovary measuring 2.2 cm which may represent but is not definitive for a hemorrhagic cyst. A followup ultrasound in 6- 8 weeks is recommended to evaluate for interval change or resolution. 2. Small amount of free fluid around the right ovary. 3. Fibroid uterus   Electronically Signed   By: Annia Belt M.D.   On: 06/17/2013 16:31   EKG Interpretation   None       MDM  No diagnosis found. Ultrasound of pelvis reveals an isoechoic mass within the right ovary.  Patient understands to get repeat ultrasound in 6-8 weeks. Rx Naprosyn 500 mg  I personally performed  the services described in this documentation, which was scribed in my presence. The recorded information has been reviewed and is accurate.    Donnetta Hutching, MD 06/17/13 1747

## 2013-06-17 NOTE — ED Notes (Signed)
Patient not found in room or department. Patient left without receiving discharge instruction/prescriptions.

## 2013-06-18 ENCOUNTER — Telehealth: Payer: Self-pay | Admitting: Nurse Practitioner

## 2013-06-18 DIAGNOSIS — N838 Other noninflammatory disorders of ovary, fallopian tube and broad ligament: Secondary | ICD-10-CM

## 2013-06-18 LAB — GC/CHLAMYDIA PROBE AMP
CT Probe RNA: NEGATIVE
GC Probe RNA: NEGATIVE

## 2013-06-21 NOTE — Telephone Encounter (Signed)
PT WENT TO [place] [facility] Friday. SAID SHE HAD U/S AND NEEDS A FOLLOWUP U/S. WANTS TO KNOW CAN WE SCHEDULE OR DOES SHE NEED TO BE SEEN. SEE U/S REPORT IN EPIC.

## 2013-06-21 NOTE — Telephone Encounter (Signed)
U/s does not be repeated until 6-8 weeks we will call and remind her and we will get it scheduled then

## 2013-06-22 NOTE — Telephone Encounter (Signed)
Please schedule pt an appt for [place] in 6-8 weeks per MMM.

## 2013-08-02 ENCOUNTER — Emergency Department (HOSPITAL_COMMUNITY): Payer: Medicaid Other

## 2013-08-02 ENCOUNTER — Encounter (HOSPITAL_COMMUNITY): Payer: Self-pay | Admitting: Emergency Medicine

## 2013-08-02 ENCOUNTER — Emergency Department (HOSPITAL_COMMUNITY)
Admission: EM | Admit: 2013-08-02 | Discharge: 2013-08-02 | Disposition: A | Discharge status: Home / Self Care | Payer: Medicaid Other | Attending: Emergency Medicine | Admitting: Emergency Medicine

## 2013-08-02 DIAGNOSIS — Z8739 Personal history of other diseases of the musculoskeletal system and connective tissue: Secondary | ICD-10-CM | POA: Insufficient documentation

## 2013-08-02 DIAGNOSIS — F17200 Nicotine dependence, unspecified, uncomplicated: Secondary | ICD-10-CM | POA: Insufficient documentation

## 2013-08-02 DIAGNOSIS — F411 Generalized anxiety disorder: Secondary | ICD-10-CM | POA: Insufficient documentation

## 2013-08-02 DIAGNOSIS — R0789 Other chest pain: Secondary | ICD-10-CM

## 2013-08-02 DIAGNOSIS — Z791 Long term (current) use of non-steroidal anti-inflammatories (NSAID): Secondary | ICD-10-CM | POA: Insufficient documentation

## 2013-08-02 DIAGNOSIS — G8929 Other chronic pain: Secondary | ICD-10-CM | POA: Insufficient documentation

## 2013-08-02 DIAGNOSIS — Z8742 Personal history of other diseases of the female genital tract: Secondary | ICD-10-CM | POA: Insufficient documentation

## 2013-08-02 DIAGNOSIS — R071 Chest pain on breathing: Secondary | ICD-10-CM | POA: Insufficient documentation

## 2013-08-02 DIAGNOSIS — Z880 Allergy status to penicillin: Secondary | ICD-10-CM | POA: Insufficient documentation

## 2013-08-02 MED ORDER — KETOROLAC TROMETHAMINE 60 MG/2ML IM SOLN
60.0000 mg | Freq: Once | INTRAMUSCULAR | Status: AC
  Administered 2013-08-02: 60 mg via INTRAMUSCULAR
  Filled 2013-08-02: qty 2

## 2013-08-02 MED ORDER — MELOXICAM 7.5 MG PO TABS: 7.5000 mg | ORAL_TABLET | Freq: Every day | ORAL | Status: DC

## 2013-08-02 MED ORDER — CYCLOBENZAPRINE HCL 10 MG PO TABS: 10.0000 mg | ORAL_TABLET | Freq: Two times a day (BID) | ORAL | Status: DC | PRN

## 2013-08-02 NOTE — ED Notes (Signed)
Pt c/o sharp pain in left chest that is worse with movement and tender to touch.  Reports is worse when she lays down.  Denies injury, denies cough.

## 2013-08-02 NOTE — ED Provider Notes (Signed)
CSN: [id number]     Arrival date & time 08/02/13  0830 History   First MD Initiated Contact with Patient 08/02/13 [phone]     Chief Complaint  Patient presents with  . Chest Pain   (Consider location/radiation/quality/duration/timing/severity/associated sxs/prior Treatment) Patient is a 32 y.o. female presenting with chest pain. The history is provided by the patient.  Chest Pain Pain location:  L chest Pain quality: sharp   Pain radiates to:  Does not radiate Pain severity:  Moderate Timing:  Constant Chronicity:  New Relieved by:  Nothing Worsened by:  Coughing, movement and deep breathing Ineffective treatments:  None tried Associated symptoms: no abdominal pain, no back pain, no fever, no headache, no nausea and not vomiting    Yesenia Gates is a 32 y.o. female who presents to the ED with left side chest pain that started last night after she cleaned the bath tub and gave her son a bath. She denies nausea, vomiting, shortness of breath or other symptoms. The pain increases with palpation, deep breath and movement. She has taken nothing for pain. She rates the pain as 4/10 while being still and 8/10 with movement.   Past Medical History  Diagnosis Date  . Herniated disc   . Anxiety   . Ovarian cyst   . Abnormal Pap smear   . Chronic back pain    Past Surgical History  Procedure Laterality Date  . Dilation and cutterage     No family history on file. History  Substance Use Topics  . Smoking status: Current Every Day Smoker    Types: Cigarettes  . Smokeless tobacco: Never Used  . Alcohol Use: Yes     Comment: occasionally   OB History   Grav Para Term Preterm Abortions TAB SAB Ect Mult Living   2 1 1             Review of Systems  Constitutional: Negative for fever and chills.  HENT: Negative.   Eyes: Negative for visual disturbance.  Cardiovascular: Positive for chest pain.  Gastrointestinal: Negative for nausea, vomiting and abdominal pain.    Genitourinary: Negative for dysuria and frequency.  Musculoskeletal: Negative for back pain.  Skin: Negative for rash.  Allergic/Immunologic: Negative for immunocompromised state.  Neurological: Negative for syncope and headaches.  Psychiatric/Behavioral: The patient is not nervous/anxious.     Allergies  Penicillins  Home Medications   Current Outpatient Rx  Name  Route  Sig  Dispense  Refill  . cyclobenzaprine (FLEXERIL) 10 MG tablet   Oral   Take 1 tablet (10 mg total) by mouth 2 (two) times daily as needed for muscle spasms.   20 tablet   0   . meloxicam (MOBIC) 7.5 MG tablet   Oral   Take 1 tablet (7.5 mg total) by mouth daily.   10 tablet   0    BP 104/62  Pulse 75  Temp(Src) 98.4 F (36.9 C) (Oral)  Resp 16  Ht 5\' 6"  (1.676 m)  Wt 200 lb (90.719 kg)  BMI 32.30 kg/m2  SpO2 100%  LMP 07/20/2013 Physical Exam  Nursing note and vitals reviewed. Constitutional: She is oriented to person, place, and time. She appears well-developed and well-nourished. No distress.  HENT:  Head: Normocephalic and atraumatic.  Eyes: EOM are normal.  Neck: Neck supple.  Cardiovascular: Normal rate, regular rhythm and normal heart sounds.   Pulmonary/Chest: Effort normal and breath sounds normal. She exhibits tenderness. She exhibits no mass, no crepitus, no edema and  no deformity.    Abdominal: Soft. There is no tenderness.  Musculoskeletal: Normal range of motion.  Neurological: She is alert and oriented to person, place, and time. No cranial nerve deficit.  Skin: Skin is warm and dry.  Psychiatric: She has a normal mood and affect. Her behavior is normal.   Dg Chest 2 View  08/02/2013   CLINICAL DATA:  Chest pain.  EXAM: CHEST  2 VIEW  COMPARISON:  07/06/2012.  FINDINGS: Mediastinum and hilar structures are normal. Mild subsegmental atelectasis in the lung bases. This may be from poor inspiration. No definite infiltrate, effusion, or pneumothorax. Heart size normal.  Pulmonary vascularity normal. No significant osseous abnormality.  IMPRESSION: Minimal atelectasis both lung bases otherwise negative chest .   Electronically Signed   By: Maisie Fus  Register   On: 08/02/2013 09:56    ED Course  Procedures EKG read by Dr. Estell Harpin as normal EKG Interpretation   None      MDM  32 y.o. female with chest wall pain. Will treat with NSAIDS and muscle relaxants. She is stable for discharge without any immediate complications. She knows that she can return at any time for worsening symptoms.  I have reviewed this patient's vital signs, nurses notes, appropriate labs and imaging.  I have discussed findings with the patient and plan of care. She voices understanding.    Medication List         cyclobenzaprine 10 MG tablet  Commonly known as:  FLEXERIL  Take 1 tablet (10 mg total) by mouth 2 (two) times daily as needed for muscle spasms.     meloxicam 7.5 MG tablet  Commonly known as:  MOBIC  Take 1 tablet (7.5 mg total) by mouth daily.           Janne Napoleon, [place] 08/02/13 [phone]

## 2013-08-09 NOTE — ED Provider Notes (Signed)
Medical screening examination/treatment/procedure(s) were performed by non-physician practitioner and as supervising physician I was immediately available for consultation/collaboration.  EKG Interpretation   None         Benny Lennert, MD 08/09/13 [phone]

## 2013-08-15 ENCOUNTER — Emergency Department (HOSPITAL_COMMUNITY)
Admission: EM | Admit: 2013-08-15 | Discharge: 2013-08-15 | Disposition: A | Discharge status: Home / Self Care | Payer: Medicaid Other | Attending: Emergency Medicine | Admitting: Emergency Medicine

## 2013-08-15 ENCOUNTER — Encounter (HOSPITAL_COMMUNITY): Payer: Self-pay | Admitting: Emergency Medicine

## 2013-08-15 DIAGNOSIS — Z8659 Personal history of other mental and behavioral disorders: Secondary | ICD-10-CM | POA: Insufficient documentation

## 2013-08-15 DIAGNOSIS — M545 Low back pain, unspecified: Secondary | ICD-10-CM

## 2013-08-15 DIAGNOSIS — M79609 Pain in unspecified limb: Secondary | ICD-10-CM | POA: Insufficient documentation

## 2013-08-15 DIAGNOSIS — G8929 Other chronic pain: Secondary | ICD-10-CM | POA: Insufficient documentation

## 2013-08-15 DIAGNOSIS — M62838 Other muscle spasm: Secondary | ICD-10-CM

## 2013-08-15 DIAGNOSIS — M5380 Other specified dorsopathies, site unspecified: Secondary | ICD-10-CM | POA: Insufficient documentation

## 2013-08-15 DIAGNOSIS — Z8742 Personal history of other diseases of the female genital tract: Secondary | ICD-10-CM | POA: Insufficient documentation

## 2013-08-15 DIAGNOSIS — Z880 Allergy status to penicillin: Secondary | ICD-10-CM | POA: Insufficient documentation

## 2013-08-15 DIAGNOSIS — F17200 Nicotine dependence, unspecified, uncomplicated: Secondary | ICD-10-CM | POA: Insufficient documentation

## 2013-08-15 MED ORDER — CYCLOBENZAPRINE HCL 10 MG PO TABS
10.0000 mg | ORAL_TABLET | Freq: Once | ORAL | Status: AC
  Administered 2013-08-15: 10 mg via ORAL
  Filled 2013-08-15: qty 1

## 2013-08-15 MED ORDER — MELOXICAM 15 MG PO TABS: 15.0000 mg | ORAL_TABLET | Freq: Every day | ORAL | Status: DC

## 2013-08-15 MED ORDER — CYCLOBENZAPRINE HCL 10 MG PO TABS: 10.0000 mg | ORAL_TABLET | Freq: Three times a day (TID) | ORAL | Status: DC | PRN

## 2013-08-15 MED ORDER — OXYCODONE-ACETAMINOPHEN 5-325 MG PO TABS
1.0000 | ORAL_TABLET | Freq: Once | ORAL | Status: AC
  Administered 2013-08-15: 1 via ORAL
  Filled 2013-08-15: qty 1

## 2013-08-15 NOTE — ED Notes (Signed)
Pt c/o worsening back pain since yesterday. Pt has hx of back pain. Pt reports medication (Mobic and Flexeril) were taken when she was at a traffic stop last week. Pt reports radiation of pain into her R thigh.

## 2013-08-16 NOTE — ED Provider Notes (Signed)
CSN: [id number]     Arrival date & time 08/15/13  1111 History   First MD Initiated Contact with Patient 08/15/13 1201     Chief Complaint  Patient presents with  . Back Pain   (Consider location/radiation/quality/duration/timing/severity/associated sxs/prior Treatment) Patient is a 32 y.o. female presenting with back pain. The history is provided by the patient.  Back Pain Location:  Lumbar spine Quality:  Aching Radiates to:  R thigh Pain severity:  Moderate Pain is:  Same all the time Onset quality:  Gradual Duration:  1 day Timing:  Constant Progression:  Unchanged Chronicity:  Recurrent Relieved by:  Bed rest Worsened by:  Bending, twisting and sitting Ineffective treatments:  None tried Associated symptoms: leg pain   Associated symptoms: no abdominal pain, no abdominal swelling, no bladder incontinence, no bowel incontinence, no chest pain, no dysuria, no fever, no headaches, no numbness, no paresthesias, no pelvic pain, no perianal numbness, no tingling and no weakness     Past Medical History  Diagnosis Date  . Herniated disc   . Anxiety   . Ovarian cyst   . Abnormal Pap smear   . Chronic back pain    Past Surgical History  Procedure Laterality Date  . Dilation and cutterage     Family History  Problem Relation Age of Onset  . Diabetes Mother   . Cancer Mother   . Diabetes Father   . Cancer Other    History  Substance Use Topics  . Smoking status: Current Every Day Smoker    Types: Cigarettes  . Smokeless tobacco: Never Used  . Alcohol Use: Yes     Comment: occasionally   OB History   Grav Para Term Preterm Abortions TAB SAB Ect Mult Living   2 1 1             Review of Systems  Constitutional: Negative for fever.  Respiratory: Negative for shortness of breath.   Cardiovascular: Negative for chest pain.  Gastrointestinal: Negative for vomiting, abdominal pain, constipation and bowel incontinence.  Genitourinary: Negative for bladder  incontinence, dysuria, hematuria, flank pain, decreased urine volume, difficulty urinating and pelvic pain.       No perineal numbness or incontinence of urine or feces  Musculoskeletal: Positive for back pain. Negative for joint swelling.  Skin: Negative for rash.  Neurological: Negative for tingling, weakness, numbness, headaches and paresthesias.  All other systems reviewed and are negative.    Allergies  Penicillins  Home Medications   Current Outpatient Rx  Name  Route  Sig  Dispense  Refill  . acetaminophen (TYLENOL) 500 MG tablet   Oral   Take 1,000 mg by mouth every 6 (six) hours as needed for mild pain.         . cyclobenzaprine (FLEXERIL) 10 MG tablet   Oral   Take 1 tablet (10 mg total) by mouth 3 (three) times daily as needed.   21 tablet   0   . meloxicam (MOBIC) 15 MG tablet   Oral   Take 1 tablet (15 mg total) by mouth daily. Take with food   20 tablet   0    BP 103/56  Pulse 90  Temp(Src) 98.7 F (37.1 C) (Oral)  Resp 16  Ht 5\' 7"  (1.702 m)  Wt 200 lb (90.719 kg)  BMI 31.32 kg/m2  SpO2 100%  LMP 07/20/2013 Physical Exam  Nursing note and vitals reviewed. Constitutional: She is oriented to person, place, and time. She appears well-developed and well-nourished.  No distress.  HENT:  Head: Normocephalic and atraumatic.  Neck: Normal range of motion. Neck supple.  Cardiovascular: Normal rate, regular rhythm, normal heart sounds and intact distal pulses.   No murmur heard. Pulmonary/Chest: Effort normal and breath sounds normal. No respiratory distress.  Abdominal: Soft. She exhibits no distension. There is no tenderness. There is no rebound and no guarding.  Musculoskeletal: She exhibits tenderness. She exhibits no edema.       Lumbar back: She exhibits tenderness and pain. She exhibits normal range of motion, no swelling, no deformity, no laceration and normal pulse.  ttp of the right lumbar spine and paraspinal muscles.  DP pulses are brisk and  symmetrical.  Distal sensation intact.  Hip Flexors/Extensors are intact  Neurological: She is alert and oriented to person, place, and time. She has normal strength. No sensory deficit. She exhibits normal muscle tone. Coordination and gait normal.  Reflex Scores:      Patellar reflexes are 2+ on the right side and 2+ on the left side.      Achilles reflexes are 2+ on the right side and 2+ on the left side. Skin: Skin is warm and dry. No rash noted.    ED Course  Procedures (including critical care time) Labs Review Labs Reviewed - No data to display Imaging Review No results found.  EKG Interpretation   None       MDM   1. Back pain, lumbosacral   2. Muscle spasm    Previous ED chart reviewed  Patient has ttp of the lumbar paraspinal muscles.  No focal neuro deficits on exam.  Ambulates with a steady gait.   No concerning sx's for emergent neurological or infectious process.  Plan includes mobic, flexeril and close f/u with her PMD.  VSS.  Pt appears stable for discharge    Laticia Vannostrand L. Kayse Puccini, PA-C 08/17/13 0001

## 2013-08-17 ENCOUNTER — Emergency Department (HOSPITAL_COMMUNITY)
Admission: EM | Admit: 2013-08-17 | Discharge: 2013-08-17 | Disposition: A | Discharge status: Home / Self Care | Payer: Medicaid Other | Attending: Emergency Medicine | Admitting: Emergency Medicine

## 2013-08-17 ENCOUNTER — Encounter (HOSPITAL_COMMUNITY): Payer: Self-pay | Admitting: Emergency Medicine

## 2013-08-17 DIAGNOSIS — Z880 Allergy status to penicillin: Secondary | ICD-10-CM | POA: Insufficient documentation

## 2013-08-17 DIAGNOSIS — F17200 Nicotine dependence, unspecified, uncomplicated: Secondary | ICD-10-CM | POA: Insufficient documentation

## 2013-08-17 DIAGNOSIS — R450 Nervousness: Secondary | ICD-10-CM | POA: Insufficient documentation

## 2013-08-17 DIAGNOSIS — W261XXA Contact with sword or dagger, initial encounter: Secondary | ICD-10-CM | POA: Insufficient documentation

## 2013-08-17 DIAGNOSIS — Z8739 Personal history of other diseases of the musculoskeletal system and connective tissue: Secondary | ICD-10-CM | POA: Insufficient documentation

## 2013-08-17 DIAGNOSIS — S61209A Unspecified open wound of unspecified finger without damage to nail, initial encounter: Secondary | ICD-10-CM | POA: Insufficient documentation

## 2013-08-17 DIAGNOSIS — Y9389 Activity, other specified: Secondary | ICD-10-CM | POA: Insufficient documentation

## 2013-08-17 DIAGNOSIS — M549 Dorsalgia, unspecified: Secondary | ICD-10-CM | POA: Insufficient documentation

## 2013-08-17 DIAGNOSIS — G8929 Other chronic pain: Secondary | ICD-10-CM | POA: Insufficient documentation

## 2013-08-17 DIAGNOSIS — Z791 Long term (current) use of non-steroidal anti-inflammatories (NSAID): Secondary | ICD-10-CM | POA: Insufficient documentation

## 2013-08-17 DIAGNOSIS — W260XXA Contact with knife, initial encounter: Secondary | ICD-10-CM | POA: Insufficient documentation

## 2013-08-17 DIAGNOSIS — S61012A Laceration without foreign body of left thumb without damage to nail, initial encounter: Secondary | ICD-10-CM

## 2013-08-17 DIAGNOSIS — Z8742 Personal history of other diseases of the female genital tract: Secondary | ICD-10-CM | POA: Insufficient documentation

## 2013-08-17 DIAGNOSIS — F411 Generalized anxiety disorder: Secondary | ICD-10-CM | POA: Insufficient documentation

## 2013-08-17 DIAGNOSIS — Y9289 Other specified places as the place of occurrence of the external cause: Secondary | ICD-10-CM | POA: Insufficient documentation

## 2013-08-17 MED ORDER — LIDOCAINE HCL (PF) 1 % IJ SOLN
INTRAMUSCULAR | Status: AC
  Filled 2013-08-17: qty 5

## 2013-08-17 MED ORDER — HYDROCODONE-ACETAMINOPHEN 5-325 MG PO TABS: 1.0000 | ORAL_TABLET | ORAL | Status: DC | PRN

## 2013-08-17 NOTE — ED Provider Notes (Signed)
History/physical exam/procedure(s) were performed by non-physician practitioner and as supervising physician I was immediately available for consultation/collaboration. I have reviewed all notes and am in agreement with care and plan.   Hilario Quarry, MD 08/17/13 1535

## 2013-08-17 NOTE — ED Provider Notes (Signed)
CSN: [id number]     Arrival date & time 08/17/13  0915 History   First MD Initiated Contact with Patient 08/17/13 [phone]     Chief Complaint  Patient presents with  . Laceration   (Consider location/radiation/quality/duration/timing/severity/associated sxs/prior Treatment) Patient is a 32 y.o. female presenting with skin laceration. The history is provided by the patient.  Laceration Location:  Hand Hand laceration location:  L finger Length (cm):  2.2 Quality: straight   Bleeding: controlled   Time since incident:  1 hour Laceration mechanism:  Knife Pain details:    Quality:  Aching   Severity:  Moderate   Timing:  Constant   Progression:  Worsening Relieved by:  Nothing Ineffective treatments:  None tried Tetanus status:  Up to date   Past Medical History  Diagnosis Date  . Herniated disc   . Anxiety   . Ovarian cyst   . Abnormal Pap smear   . Chronic back pain    Past Surgical History  Procedure Laterality Date  . Dilation and cutterage     Family History  Problem Relation Age of Onset  . Diabetes Mother   . Cancer Mother   . Diabetes Father   . Cancer Other    History  Substance Use Topics  . Smoking status: Current Every Day Smoker    Types: Cigarettes  . Smokeless tobacco: Never Used  . Alcohol Use: Yes     Comment: occasionally   OB History   Grav Para Term Preterm Abortions TAB SAB Ect Mult Living   2 1 1             Review of Systems  Constitutional: Negative for activity change.       All ROS Neg except as noted in HPI  HENT: Negative for nosebleeds.   Eyes: Negative for photophobia and discharge.  Respiratory: Negative for cough, shortness of breath and wheezing.   Cardiovascular: Negative for chest pain and palpitations.  Gastrointestinal: Negative for abdominal pain and blood in stool.  Genitourinary: Negative for dysuria, frequency and hematuria.  Musculoskeletal: Positive for back pain. Negative for arthralgias and neck pain.  Skin:  Negative.   Neurological: Negative for dizziness, seizures and speech difficulty.  Psychiatric/Behavioral: Negative for hallucinations and confusion. The patient is nervous/anxious.     Allergies  Penicillins  Home Medications   Current Outpatient Rx  Name  Route  Sig  Dispense  Refill  . acetaminophen (TYLENOL) 500 MG tablet   Oral   Take 1,000 mg by mouth every 6 (six) hours as needed for mild pain.         . cyclobenzaprine (FLEXERIL) 10 MG tablet   Oral   Take 1 tablet (10 mg total) by mouth 3 (three) times daily as needed.   21 tablet   0   . meloxicam (MOBIC) 15 MG tablet   Oral   Take 1 tablet (15 mg total) by mouth daily. Take with food   20 tablet   0    LMP 07/20/2013 Physical Exam  Nursing note and vitals reviewed. Constitutional: She is oriented to person, place, and time. She appears well-developed and well-nourished.  Non-toxic appearance.  HENT:  Head: Normocephalic.  Right Ear: Tympanic membrane and external ear normal.  Left Ear: Tympanic membrane and external ear normal.  Eyes: EOM and lids are normal. Pupils are equal, round, and reactive to light.  Neck: Normal range of motion. Neck supple. Carotid bruit is not present.  Cardiovascular: Normal rate, regular rhythm,  normal heart sounds, intact distal pulses and normal pulses.   Pulmonary/Chest: Breath sounds normal. No respiratory distress.  Abdominal: Soft. Bowel sounds are normal. There is no tenderness. There is no guarding.  Musculoskeletal: Normal range of motion.       Hands: Lymphadenopathy:       Head (right side): No submandibular adenopathy present.       Head (left side): No submandibular adenopathy present.    She has no cervical adenopathy.  Neurological: She is alert and oriented to person, place, and time. She has normal strength. No cranial nerve deficit or sensory deficit.  Skin: Skin is warm and dry.  Psychiatric: Her speech is normal. Her mood appears anxious.    ED Course    LACERATION REPAIR Date/Time: 08/17/2013 10:14 AM Performed by: Kathie Dike Authorized by: Kathie Dike Consent: Verbal consent obtained. Risks and benefits: risks, benefits and alternatives were discussed Consent given by: patient Patient understanding: patient states understanding of the procedure being performed Patient identity confirmed: arm band Time out: Immediately prior to procedure a "time out" was called to verify the correct patient, procedure, equipment, support staff and site/side marked as required. Body area: upper extremity Location details: left thumb Laceration length: 2.2 cm Foreign bodies: no foreign bodies Vascular damage: no Anesthesia: local infiltration Local anesthetic: lidocaine 1% without epinephrine Patient sedated: no Preparation: Patient was prepped and draped in the usual sterile fashion. Irrigation solution: saline Amount of cleaning: standard Debridement: none Skin closure: 4-0 nylon Number of sutures: 4 Technique: simple Approximation: close Approximation difficulty: simple Dressing: gauze roll Patient tolerance: Patient tolerated the procedure well with no immediate complications.   (including critical care time) Labs Review Labs Reviewed - No data to display Imaging Review No results found.  EKG Interpretation   None       MDM  No diagnosis found. **I have reviewed nursing notes, vital signs, and all appropriate lab and imaging results for this patient.*  Pt sustained a laceration to the left thumb. Laceration repaired. Pt advised to keep wound clean and dry. She will have the sutures removed in 7 days.  Kathie Dike, PA-C 08/17/13 1017

## 2013-08-17 NOTE — ED Provider Notes (Signed)
Medical screening examination/treatment/procedure(s) were performed by non-physician practitioner and as supervising physician I was immediately available for consultation/collaboration.  EKG Interpretation   None        Doug Sou, MD 08/17/13 [phone]

## 2013-08-17 NOTE — ED Notes (Signed)
Pt c/o laceration to left thumb from a knife, pt was attempting to open a can with a knife when the knife slipped, last tetanus last year.

## 2013-08-24 ENCOUNTER — Emergency Department (HOSPITAL_COMMUNITY)
Admission: EM | Admit: 2013-08-24 | Discharge: 2013-08-24 | Disposition: A | Discharge status: Home / Self Care | Payer: Medicaid Other | Attending: Emergency Medicine | Admitting: Emergency Medicine

## 2013-08-24 ENCOUNTER — Encounter (HOSPITAL_COMMUNITY): Payer: Self-pay | Admitting: Emergency Medicine

## 2013-08-24 DIAGNOSIS — N898 Other specified noninflammatory disorders of vagina: Secondary | ICD-10-CM

## 2013-08-24 DIAGNOSIS — F17200 Nicotine dependence, unspecified, uncomplicated: Secondary | ICD-10-CM | POA: Insufficient documentation

## 2013-08-24 DIAGNOSIS — G8929 Other chronic pain: Secondary | ICD-10-CM | POA: Insufficient documentation

## 2013-08-24 DIAGNOSIS — F411 Generalized anxiety disorder: Secondary | ICD-10-CM | POA: Insufficient documentation

## 2013-08-24 DIAGNOSIS — Z791 Long term (current) use of non-steroidal anti-inflammatories (NSAID): Secondary | ICD-10-CM | POA: Insufficient documentation

## 2013-08-24 DIAGNOSIS — Z880 Allergy status to penicillin: Secondary | ICD-10-CM | POA: Insufficient documentation

## 2013-08-24 DIAGNOSIS — Z4802 Encounter for removal of sutures: Secondary | ICD-10-CM | POA: Insufficient documentation

## 2013-08-24 DIAGNOSIS — Z8739 Personal history of other diseases of the musculoskeletal system and connective tissue: Secondary | ICD-10-CM | POA: Insufficient documentation

## 2013-08-24 LAB — WET PREP, GENITAL
Clue Cells Wet Prep HPF POC: NONE SEEN
Yeast Wet Prep HPF POC: NONE SEEN

## 2013-08-24 MED ORDER — METRONIDAZOLE 500 MG PO TABS: 500.0000 mg | ORAL_TABLET | Freq: Two times a day (BID) | ORAL | Status: DC

## 2013-08-24 NOTE — ED Notes (Addendum)
Here for suture removal lt thumb,sutures removed by PA.    Also says she has a vaginal d/c.  also

## 2013-08-24 NOTE — ED Provider Notes (Signed)
CSN: [id number]     Arrival date & time 08/24/13  1411 History   First MD Initiated Contact with Patient 08/24/13 1421     No chief complaint on file.  (Consider location/radiation/quality/duration/timing/severity/associated sxs/prior Treatment) HPI Comments: Patient presents today with two separate complaints.  She reports that she has had both vaginal discharge and also needs to have sutures removed from her left thumb.  Sutures were placed seven days ago after she cut her finger with a knife.  She reports that she has not noticed any drainage from the area.  No surrounding erythema, edema, or warmth.  No numbness or tingling.  Denies fever or chills.  Patient is also complaining of a whitish colored vaginal discharge with a "fishy odor."  She reports that the discharge has been present for the past several days.  She denies abdominal or pelvic pain.  Denies nausea, vomiting, fever, or chills.  She states that she has had Bacterial Vaginosis in the past and that symptoms today are similar.    The history is provided by the patient.    Past Medical History  Diagnosis Date  . Herniated disc   . Anxiety   . Ovarian cyst   . Abnormal Pap smear   . Chronic back pain    Past Surgical History  Procedure Laterality Date  . Dilation and cutterage     Family History  Problem Relation Age of Onset  . Diabetes Mother   . Cancer Mother   . Diabetes Father   . Cancer Other    History  Substance Use Topics  . Smoking status: Current Every Day Smoker    Types: Cigarettes  . Smokeless tobacco: Never Used  . Alcohol Use: Yes     Comment: occasionally   OB History   Grav Para Term Preterm Abortions TAB SAB Ect Mult Living   2 1 1             Review of Systems  Genitourinary: Positive for vaginal discharge.  Skin: Positive for wound.  All other systems reviewed and are negative.    Allergies  Penicillins  Home Medications   Current Outpatient Rx  Name  Route  Sig  Dispense   Refill  . acetaminophen (TYLENOL) 500 MG tablet   Oral   Take 1,000 mg by mouth every 6 (six) hours as needed for mild pain.         . cyclobenzaprine (FLEXERIL) 10 MG tablet   Oral   Take 1 tablet (10 mg total) by mouth 3 (three) times daily as needed.   21 tablet   0   . HYDROcodone-acetaminophen (NORCO) 5-325 MG per tablet   Oral   Take 1 tablet by mouth every 4 (four) hours as needed for moderate pain.   10 tablet   0   . meloxicam (MOBIC) 15 MG tablet   Oral   Take 1 tablet (15 mg total) by mouth daily. Take with food   20 tablet   0    BP 110/71  Pulse 105  Temp(Src) 98.1 F (36.7 C) (Oral)  Resp 20  Ht 5\' 6"  (1.676 m)  Wt 200 lb (90.719 kg)  BMI 32.30 kg/m2  SpO2 100%  LMP 07/15/2013 Physical Exam  Nursing note and vitals reviewed. Constitutional: She appears well-developed and well-nourished.  HENT:  Head: Normocephalic and atraumatic.  Mouth/Throat: Oropharynx is clear and moist.  Neck: Normal range of motion. Neck supple.  Cardiovascular: Normal rate, regular rhythm and normal heart sounds.  Pulmonary/Chest: Effort normal and breath sounds normal.  Genitourinary: Cervix exhibits no motion tenderness. Right adnexum displays no mass, no tenderness and no fullness. Left adnexum displays no mass, no tenderness and no fullness.  Neurological: She is alert.  Skin: Skin is warm and dry.  Laceration of the left thumb appears to be healing well.  Small amount of the central portion of the laceration remains gaping slightly.  No drainage.  No surrounding erythema, edema, or warmth.  Psychiatric: She has a normal mood and affect.    ED Course  Procedures (including critical care time) Labs Review Labs Reviewed  GC/CHLAMYDIA PROBE AMP  WET PREP, GENITAL   Imaging Review No results found.  EKG Interpretation   None       MDM  No diagnosis found. Patient presenting with two separate complaints.  Patient complaining of vaginal discharge and also  presenting for suture removal.  Small area of the laceration appears to be gaping slightly after sutures removed.  Steri strips applied with good closure.  No signs of infection at this time.  Patient neurovascularly intact.  Patient also complaining of vaginal discharge.  No pain with pelvic exam.  No CMT.  Wet prep is negative.  GC/Chlamydia pending.  Feel that the patient is stable for discharge.  Return precautions given.    Santiago Glad, PA-C 08/24/13 [phone]

## 2013-08-26 LAB — GC/CHLAMYDIA PROBE AMP
CT Probe RNA: NEGATIVE
GC Probe RNA: NEGATIVE

## 2013-08-27 NOTE — ED Provider Notes (Signed)
Medical screening examination/treatment/procedure(s) were performed by non-physician practitioner and as supervising physician I was immediately available for consultation/collaboration.  EKG Interpretation   None        Donnetta Hutching, MD 08/27/13 1820

## 2013-09-13 ENCOUNTER — Telehealth: Payer: Self-pay | Admitting: Nurse Practitioner

## 2013-09-13 ENCOUNTER — Ambulatory Visit: Payer: Medicaid Other | Admitting: General Practice

## 2013-09-13 NOTE — Telephone Encounter (Signed)
Spoke with pt and she says different doctors been giving her the depo provera and wants it ordered tonight so she can get shot tomorrow  Advised pt that this med is not on her list and no record in computer system that it has been given.  Instructed pt to call in am since she says MMM has given it to her before.

## 2013-09-14 ENCOUNTER — Ambulatory Visit (INDEPENDENT_AMBULATORY_CARE_PROVIDER_SITE_OTHER): Payer: Medicaid Other | Admitting: *Deleted

## 2013-09-14 ENCOUNTER — Other Ambulatory Visit: Payer: Self-pay | Admitting: Nurse Practitioner

## 2013-09-14 DIAGNOSIS — Z309 Encounter for contraceptive management, unspecified: Secondary | ICD-10-CM

## 2013-09-14 DIAGNOSIS — IMO0001 Reserved for inherently not codable concepts without codable children: Secondary | ICD-10-CM

## 2013-09-14 NOTE — Progress Notes (Signed)
Patient here today for depo provera and she has not received one in over a year. Patient advised that she must have serum pregnancy and once those results come back she may have her depo provera.

## 2013-09-15 LAB — HCG, QUANTITATIVE, PREGNANCY: hCG Quant: <1 m[IU]/mL

## 2013-09-16 ENCOUNTER — Ambulatory Visit (INDEPENDENT_AMBULATORY_CARE_PROVIDER_SITE_OTHER): Payer: Medicaid Other | Admitting: *Deleted

## 2013-09-16 ENCOUNTER — Telehealth: Payer: Self-pay | Admitting: Nurse Practitioner

## 2013-09-16 DIAGNOSIS — IMO0001 Reserved for inherently not codable concepts without codable children: Secondary | ICD-10-CM

## 2013-09-16 DIAGNOSIS — Z309 Encounter for contraceptive management, unspecified: Secondary | ICD-10-CM

## 2013-09-16 MED ORDER — MEDROXYPROGESTERONE ACETATE 150 MG/ML IM SUSP
150.0000 mg | Freq: Once | INTRAMUSCULAR | Status: AC
  Administered 2013-09-16: 150 mg via INTRAMUSCULAR

## 2013-09-16 NOTE — Telephone Encounter (Signed)
Patient aware lab negative coming in for her shot

## 2013-09-17 ENCOUNTER — Ambulatory Visit: Payer: Medicaid Other

## 2013-10-01 NOTE — Telephone Encounter (Signed)
This patient never had follow up U/S. If she needs this, an order needs to be placed

## 2013-10-09 ENCOUNTER — Emergency Department (HOSPITAL_COMMUNITY)
Admission: EM | Admit: 2013-10-09 | Discharge: 2013-10-09 | Disposition: A | Discharge status: Home / Self Care | Payer: Medicaid Other | Attending: Emergency Medicine | Admitting: Emergency Medicine

## 2013-10-09 ENCOUNTER — Encounter (HOSPITAL_COMMUNITY): Payer: Self-pay | Admitting: Emergency Medicine

## 2013-10-09 DIAGNOSIS — Z8659 Personal history of other mental and behavioral disorders: Secondary | ICD-10-CM | POA: Insufficient documentation

## 2013-10-09 DIAGNOSIS — Z8739 Personal history of other diseases of the musculoskeletal system and connective tissue: Secondary | ICD-10-CM | POA: Insufficient documentation

## 2013-10-09 DIAGNOSIS — G8929 Other chronic pain: Secondary | ICD-10-CM | POA: Insufficient documentation

## 2013-10-09 DIAGNOSIS — Z791 Long term (current) use of non-steroidal anti-inflammatories (NSAID): Secondary | ICD-10-CM | POA: Insufficient documentation

## 2013-10-09 DIAGNOSIS — R059 Cough, unspecified: Secondary | ICD-10-CM

## 2013-10-09 DIAGNOSIS — J3489 Other specified disorders of nose and nasal sinuses: Secondary | ICD-10-CM | POA: Insufficient documentation

## 2013-10-09 DIAGNOSIS — F17200 Nicotine dependence, unspecified, uncomplicated: Secondary | ICD-10-CM | POA: Insufficient documentation

## 2013-10-09 DIAGNOSIS — Z8742 Personal history of other diseases of the female genital tract: Secondary | ICD-10-CM | POA: Insufficient documentation

## 2013-10-09 DIAGNOSIS — Z880 Allergy status to penicillin: Secondary | ICD-10-CM | POA: Insufficient documentation

## 2013-10-09 DIAGNOSIS — R05 Cough: Secondary | ICD-10-CM

## 2013-10-09 MED ORDER — ALBUTEROL SULFATE HFA 108 (90 BASE) MCG/ACT IN AERS
2.0000 | INHALATION_SPRAY | Freq: Once | RESPIRATORY_TRACT | Status: AC
Start: 2013-10-09 — End: 2013-10-09
  Administered 2013-10-09: 2 via RESPIRATORY_TRACT
  Filled 2013-10-09: qty 6.7

## 2013-10-09 MED ORDER — BENZONATATE 200 MG PO CAPS: 200.0000 mg | ORAL_CAPSULE | Freq: Three times a day (TID) | ORAL | Status: DC | PRN

## 2013-10-09 MED ORDER — PREDNISONE 10 MG PO TABS: ORAL_TABLET | ORAL | Status: DC

## 2013-10-09 NOTE — ED Notes (Signed)
Pt seen and evaluated by EDPa for initial assessment. 

## 2013-10-09 NOTE — ED Notes (Signed)
C/O con't cough and congestion although she has completed Z-pak last week.

## 2013-10-09 NOTE — Discharge Instructions (Signed)
Cough, Adult  A cough is a reflex. It helps you clear your throat and airways. A cough can help heal your body. A cough can last 2 or 3 weeks (acute) or may last more than 8 weeks (chronic). Some common causes of a cough can include an infection, allergy, or a cold. HOME CARE  Only take medicine as told by your doctor.  If given, take your medicines (antibiotics) as told. Finish them even if you start to feel better.  Use a cold steam vaporizer or humidier in your home. This can help loosen thick spit (secretions).  Sleep so you are almost sitting up (semi-upright). Use pillows to do this. This helps reduce coughing.  Rest as needed.  Stop smoking if you smoke. GET HELP RIGHT AWAY IF:  You have yellowish-white fluid (pus) in your thick spit.  Your cough gets worse.  Your medicine does not reduce coughing, and you are losing sleep.  You cough up blood.  You have trouble breathing.  Your pain gets worse and medicine does not help.  You have a fever. MAKE SURE YOU:   Understand these instructions.  Will watch your condition.  Will get help right away if you are not doing well or get worse. Document Released: 05/09/2011 Document Revised: 11/18/2011 Document Reviewed: 05/09/2011 [employer] Patient Information 2014 [employer].  

## 2013-10-11 NOTE — ED Provider Notes (Signed)
CSN: [id number]     Arrival date & time 10/09/13  2033 History   First MD Initiated Contact with Patient 10/09/13 2109     Chief Complaint  Patient presents with  . Cough   (Consider location/radiation/quality/duration/timing/severity/associated sxs/prior Treatment) Patient is a 33 y.o. female presenting with cough. The history is provided by the patient.  Cough Cough characteristics:  Productive Sputum characteristics:  Yellow Severity:  Moderate Onset quality:  Gradual Duration:  1 week Timing:  Constant Progression:  Unchanged Chronicity:  New Smoker: yes   Context: upper respiratory infection   Context: not sick contacts   Relieved by:  Nothing Worsened by:  Nothing tried Ineffective treatments: zithromax. Associated symptoms: sinus congestion   Associated symptoms: no chest pain, no chills, no diaphoresis, no ear pain, no fever, no headaches, no myalgias, no rash, no rhinorrhea, no shortness of breath, no sore throat and no wheezing     Past Medical History  Diagnosis Date  . Herniated disc   . Anxiety   . Ovarian cyst   . Abnormal Pap smear   . Chronic back pain    Past Surgical History  Procedure Laterality Date  . Dilation and cutterage     Family History  Problem Relation Age of Onset  . Diabetes Mother   . Cancer Mother   . Diabetes Father   . Cancer Other    History  Substance Use Topics  . Smoking status: Current Every Day Smoker -- 0.50 packs/day    Types: Cigarettes  . Smokeless tobacco: Never Used  . Alcohol Use: Yes     Comment: occasionally   OB History   Grav Para Term Preterm Abortions TAB SAB Ect Mult Living   2 1 1             Review of Systems  Constitutional: Negative for fever, chills, diaphoresis, activity change and appetite change.  HENT: Positive for congestion. Negative for ear pain, facial swelling, rhinorrhea, sore throat and trouble swallowing.   Eyes: Negative for visual disturbance.  Respiratory: Positive for cough.  Negative for chest tightness, shortness of breath, wheezing and stridor.   Cardiovascular: Negative for chest pain.  Gastrointestinal: Negative for nausea, vomiting and abdominal pain.  Genitourinary: Negative for dysuria and difficulty urinating.  Musculoskeletal: Negative for myalgias, neck pain and neck stiffness.  Skin: Negative.  Negative for rash.  Neurological: Negative for dizziness, weakness, numbness and headaches.  Hematological: Negative for adenopathy.  Psychiatric/Behavioral: Negative for confusion.  All other systems reviewed and are negative.    Allergies  Penicillins  Home Medications   Current Outpatient Rx  Name  Route  Sig  Dispense  Refill  . acetaminophen (TYLENOL) 500 MG tablet   Oral   Take 1,000 mg by mouth every 6 (six) hours as needed for mild pain.         . meloxicam (MOBIC) 15 MG tablet   Oral   Take 1 tablet (15 mg total) by mouth daily. Take with food   20 tablet   0   . benzonatate (TESSALON) 200 MG capsule   Oral   Take 1 capsule (200 mg total) by mouth 3 (three) times daily as needed for cough.   21 capsule   0   . predniSONE (DELTASONE) 10 MG tablet      Take 6 tablets day one, 5 tablets day two, 4 tablets day three, 3 tablets day four, 2 tablets day five, then 1 tablet day six   21 tablet  0    BP 112/88  Pulse 96  Temp(Src) 98.2 F (36.8 C) (Oral)  Resp 18  Ht 5\' 6"  (1.676 m)  Wt 197 lb (89.359 kg)  BMI 31.81 kg/m2  SpO2 98% Physical Exam  Nursing note and vitals reviewed. Constitutional: She is oriented to person, place, and time. She appears well-developed and well-nourished. No distress.  HENT:  Head: Normocephalic and atraumatic.  Right Ear: Tympanic membrane and ear canal normal.  Left Ear: Tympanic membrane and ear canal normal.  Mouth/Throat: Uvula is midline, oropharynx is clear and moist and mucous membranes are normal. No oropharyngeal exudate.  Eyes: EOM are normal. Pupils are equal, round, and reactive to  light.  Neck: Normal range of motion, full passive range of motion without pain and phonation normal. Neck supple.  Cardiovascular: Normal rate, regular rhythm, normal heart sounds and intact distal pulses.   No murmur heard. Pulmonary/Chest: Effort normal and breath sounds normal. No stridor. No respiratory distress. She has no rales. She exhibits no tenderness.  Coarse lungs sounds bilaterally with few expiratory wheezes.  No rales   Abdominal: Soft. She exhibits no distension. There is no tenderness. There is no rebound and no guarding.  Musculoskeletal: Normal range of motion. She exhibits no edema.  Lymphadenopathy:    She has no cervical adenopathy.  Neurological: She is alert and oriented to person, place, and time. She exhibits normal muscle tone. Coordination normal.  Skin: Skin is warm and dry.    ED Course  Procedures (including critical care time) Labs Review Labs Reviewed - No data to display Imaging Review No results found.  EKG Interpretation   None       MDM   1. Cough     Patient with persistent cough not improved after z-pack.  Likely viral illness.  VSS.  No indication for imaging at this time.  Patient agrees to symptomatic treatment with prednisone taper and tessalon perles.  PERC negative.  She appears stable for discharge.   Luberta Grabinski L. Bristal Steffy, PA-C 10/11/13 0136

## 2013-10-12 ENCOUNTER — Ambulatory Visit (HOSPITAL_COMMUNITY): Admission: RE | Admit: 2013-10-12 | Payer: Medicaid Other | Source: Ambulatory Visit

## 2013-10-12 ENCOUNTER — Ambulatory Visit (HOSPITAL_COMMUNITY): Payer: Medicaid Other

## 2013-10-13 NOTE — ED Provider Notes (Signed)
Medical screening examination/treatment/procedure(s) were performed by non-physician practitioner and as supervising physician I was immediately available for consultation/collaboration.  EKG Interpretation   None       Rolland Porter, MD, Abram Sander   Janice Norrie, MD 10/13/13 [phone]

## 2013-11-12 ENCOUNTER — Emergency Department (HOSPITAL_COMMUNITY)
Admission: EM | Admit: 2013-11-12 | Discharge: 2013-11-12 | Disposition: A | Discharge status: Home / Self Care | Payer: Medicaid Other | Attending: Emergency Medicine | Admitting: Emergency Medicine

## 2013-11-12 ENCOUNTER — Emergency Department (HOSPITAL_COMMUNITY): Payer: Medicaid Other

## 2013-11-12 ENCOUNTER — Encounter (HOSPITAL_COMMUNITY): Payer: Self-pay | Admitting: Emergency Medicine

## 2013-11-12 DIAGNOSIS — F17210 Nicotine dependence, cigarettes, uncomplicated: Secondary | ICD-10-CM

## 2013-11-12 DIAGNOSIS — Z8742 Personal history of other diseases of the female genital tract: Secondary | ICD-10-CM | POA: Insufficient documentation

## 2013-11-12 DIAGNOSIS — R05 Cough: Secondary | ICD-10-CM

## 2013-11-12 DIAGNOSIS — J3489 Other specified disorders of nose and nasal sinuses: Secondary | ICD-10-CM | POA: Insufficient documentation

## 2013-11-12 DIAGNOSIS — F17200 Nicotine dependence, unspecified, uncomplicated: Secondary | ICD-10-CM | POA: Insufficient documentation

## 2013-11-12 DIAGNOSIS — Z8719 Personal history of other diseases of the digestive system: Secondary | ICD-10-CM | POA: Insufficient documentation

## 2013-11-12 DIAGNOSIS — G8929 Other chronic pain: Secondary | ICD-10-CM | POA: Insufficient documentation

## 2013-11-12 DIAGNOSIS — Z8659 Personal history of other mental and behavioral disorders: Secondary | ICD-10-CM | POA: Insufficient documentation

## 2013-11-12 DIAGNOSIS — R059 Cough, unspecified: Secondary | ICD-10-CM | POA: Insufficient documentation

## 2013-11-12 MED ORDER — BENZONATATE 100 MG PO CAPS
200.0000 mg | ORAL_CAPSULE | Freq: Once | ORAL | Status: AC
  Administered 2013-11-12: 200 mg via ORAL
  Filled 2013-11-12: qty 2

## 2013-11-12 MED ORDER — BENZONATATE 100 MG PO CAPS: 200.0000 mg | ORAL_CAPSULE | Freq: Three times a day (TID) | ORAL | Status: DC | PRN

## 2013-11-12 NOTE — ED Notes (Addendum)
Cough, congestion, with chest congestion since Dec. Has been seen here for same and given antibiotic and steroid, Says she has been to her MD and to other ER's without relief of sx.   Ears "stopped up",

## 2013-11-12 NOTE — Discharge Instructions (Signed)
Cough, Adult  A cough is a reflex that helps clear your throat and airways. It can help heal the body or may be a reaction to an irritated airway. A cough may only last 2 or 3 weeks (acute) or may last more than 8 weeks (chronic).  CAUSES Acute cough:  Viral or bacterial infections. Chronic cough:  Infections.  Allergies.  Asthma.  Post-nasal drip.  Smoking.  Heartburn or acid reflux.  Some medicines.  Chronic lung problems (COPD).  Cancer. SYMPTOMS   Cough.  Fever.  Chest pain.  Increased breathing rate.  High-pitched whistling sound when breathing (wheezing).  Colored mucus that you cough up (sputum). TREATMENT   A bacterial cough may be treated with antibiotic medicine.  A viral cough must run its course and will not respond to antibiotics.  Your caregiver may recommend other treatments if you have a chronic cough. HOME CARE INSTRUCTIONS   Only take over-the-counter or prescription medicines for pain, discomfort, or fever as directed by your caregiver. Use cough suppressants only as directed by your caregiver.  Use a cold steam vaporizer or humidifier in your bedroom or home to help loosen secretions.  Sleep in a semi-upright position if your cough is worse at night.  Rest as needed.  Stop smoking if you smoke. SEEK IMMEDIATE MEDICAL CARE IF:   You have pus in your sputum.  Your cough starts to worsen.  You cannot control your cough with suppressants and are losing sleep.  You begin coughing up blood.  You have difficulty breathing.  You develop pain which is getting worse or is uncontrolled with medicine.  You have a fever. MAKE SURE YOU:   Understand these instructions.  Will watch your condition.  Will get help right away if you are not doing well or get worse. Document Released: 02/22/2011 Document Revised: 11/18/2011 Document Reviewed: 02/22/2011 [facility] Patient Information 2014 [place].  Smoking Hazards Smoking  cigarettes is extremely bad for your health. Tobacco smoke has over 200 known poisons in it. It contains the poisonous gases nitrogen oxide and carbon monoxide. There are over 60 chemicals in tobacco smoke that cause cancer. Some of the chemicals found in cigarette smoke include:   Cyanide.   Benzene.   Formaldehyde.   Methanol (wood alcohol).   Acetylene (fuel used in welding torches).   Ammonia.  Even smoking lightly shortens your life expectancy by several years. You can greatly reduce the risk of medical problems for you and your family by stopping now. Smoking is the most preventable cause of death and disease in our society. Within days of quitting smoking, your circulation improves, you decrease the risk of having a heart attack, and your lung capacity improves. There may be some increased phlegm in the first few days after quitting, and it may take months for your lungs to clear up completely. Quitting for 10 years reduces your risk of developing lung cancer to almost that of a nonsmoker.  WHAT ARE THE RISKS OF SMOKING? Cigarette smokers have an increased risk of many serious medical problems, including:  Lung cancer.   Lung disease (such as pneumonia, bronchitis, and emphysema).   Heart attack and chest pain due to the heart not getting enough oxygen (angina).   Heart disease and peripheral blood vessel disease.   Hypertension.   Stroke.   Oral cancer (cancer of the lip, mouth, or voice box).   Bladder cancer.   Pancreatic cancer.   Cervical cancer.   Pregnancy complications, including premature birth.  Stillbirths and smaller newborn babies, birth defects, and genetic damage to sperm.   Early menopause.   Lower estrogen level for women.   Infertility.   Facial wrinkles.   Blindness.   Increased risk of broken bones (fractures).   Senile dementia.   Stomach ulcers and internal bleeding.   Delayed wound healing and increased  risk of complications during surgery. Because of secondhand smoke exposure, children of smokers have an increased risk of the following:   Sudden infant death syndrome (SIDS).   Respiratory infections.   Lung cancer.   Heart disease.   Ear infections.  WHY IS SMOKING ADDICTIVE? Nicotine is the chemical agent in tobacco that is capable of causing addiction or dependence. When you smoke and inhale, nicotine is absorbed rapidly into the bloodstream through your lungs. Both inhaled and noninhaled nicotine may be addictive.  WHAT ARE THE BENEFITS OF QUITTING?  There are many health benefits to quitting smoking. Some are:   The likelihood of developing cancer and heart disease decreases. Health improvements are seen almost immediately.   Blood pressure, pulse rate, and breathing patterns start returning to normal soon after quitting.   People who quit may see an improvement in their overall quality of life.  HOW DO YOU QUIT SMOKING? Smoking is an addiction with both physical and psychological effects, and longtime habits can be hard to change. Your health care provider can recommend:  Programs and community resources, which may include group support, education, or therapy.  Replacement products, such as patches, gum, and nasal sprays. Use these products only as directed. Do not replace cigarette smoking with electronic cigarettes (commonly called e-cigarettes). The safety of e-cigarettes is unknown, and some may contain harmful chemicals. FOR MORE INFORMATION  American Lung Association: www.lung.org  American Cancer Society: www.cancer.org Document Released: 10/03/2004 Document Revised: 06/16/2013 Document Reviewed: 02/15/2013 [facility] Patient Information 2014 [place], [place].  Smoking Cessation Quitting smoking is important to your health and has many advantages. However, it is not always easy to quit since nicotine is a very addictive drug. Often times, people try 3 times or  more before being able to quit. This document explains the best ways for you to prepare to quit smoking. Quitting takes hard work and a lot of effort, but you can do it. ADVANTAGES OF QUITTING SMOKING  You will live longer, feel better, and live better.  Your body will feel the impact of quitting smoking almost immediately.  Within 20 minutes, blood pressure decreases. Your pulse returns to its normal level.  After 8 hours, carbon monoxide levels in the blood return to normal. Your oxygen level increases.  After 24 hours, the chance of having a heart attack starts to decrease. Your breath, hair, and body stop smelling like smoke.  After 48 hours, damaged nerve endings begin to recover. Your sense of taste and smell improve.  After 72 hours, the body is virtually free of nicotine. Your bronchial tubes relax and breathing becomes easier.  After 2 to 12 weeks, lungs can hold more air. Exercise becomes easier and circulation improves.  The risk of having a heart attack, stroke, cancer, or lung disease is greatly reduced.  After 1 year, the risk of coronary heart disease is cut in half.  After 5 years, the risk of stroke falls to the same as a nonsmoker.  After 10 years, the risk of lung cancer is cut in half and the risk of other cancers decreases significantly.  After 15 years, the risk of coronary heart  disease drops, usually to the level of a nonsmoker.  If you are pregnant, quitting smoking will improve your chances of having a healthy baby.  The people you live with, especially any children, will be healthier.  You will have extra money to spend on things other than cigarettes. QUESTIONS TO THINK ABOUT BEFORE ATTEMPTING TO QUIT You may want to talk about your answers with your caregiver.  Why do you want to quit?  If you tried to quit in the past, what helped and what did not?  What will be the most difficult situations for you after you quit? How will you plan to handle  them?  Who can help you through the tough times? Your family? Friends? A caregiver?  What pleasures do you get from smoking? What ways can you still get pleasure if you quit? Here are some questions to ask your caregiver:  How can you help me to be successful at quitting?  What medicine do you think would be best for me and how should I take it?  What should I do if I need more help?  What is smoking withdrawal like? How can I get information on withdrawal? GET READY  Set a quit date.  Change your environment by getting rid of all cigarettes, ashtrays, matches, and lighters in your home, car, or work. Do not let people smoke in your home.  Review your past attempts to quit. Think about what worked and what did not. GET SUPPORT AND ENCOURAGEMENT You have a better chance of being successful if you have help. You can get support in many ways.  Tell your family, friends, and co-workers that you are going to quit and need their support. Ask them not to smoke around you.  Get individual, group, or telephone counseling and support. Programs are available at [employer] and health centers. Call your local health department for information about programs in your area.  Spiritual beliefs and practices may help some smokers quit.  Download a "quit meter" on your computer to keep track of quit statistics, such as how long you have gone without smoking, cigarettes not smoked, and money saved.  Get a self-help book about quitting smoking and staying off of tobacco. [place] yourself from urges to smoke. Talk to someone, go for a walk, or occupy your time with a task.  Change your normal routine. Take a different route to work. Drink tea instead of coffee. Eat breakfast in a different place.  Reduce your stress. Take a hot bath, exercise, or read a book.  Plan something enjoyable to do every day. Reward yourself for not smoking.  Explore interactive  web-based programs that specialize in helping you quit. GET MEDICINE AND USE IT CORRECTLY Medicines can help you stop smoking and decrease the urge to smoke. Combining medicine with the above behavioral methods and support can greatly increase your chances of successfully quitting smoking.  Nicotine replacement therapy helps deliver nicotine to your body without the negative effects and risks of smoking. Nicotine replacement therapy includes nicotine gum, lozenges, inhalers, nasal sprays, and skin patches. Some may be available over-the-counter and others require a prescription.  Antidepressant medicine helps people abstain from smoking, but how this works is unknown. This medicine is available by prescription.  Nicotinic receptor partial agonist medicine simulates the effect of nicotine in your brain. This medicine is available by prescription. Ask your caregiver for advice about which medicines to use and how to use them  based on your health history. Your caregiver will tell you what side effects to look out for if you choose to be on a medicine or therapy. Carefully read the information on the package. Do not use any other product containing nicotine while using a nicotine replacement product.  RELAPSE OR DIFFICULT SITUATIONS Most relapses occur within the first 3 months after quitting. Do not be discouraged if you start smoking again. Remember, most people try several times before finally quitting. You may have symptoms of withdrawal because your body is used to nicotine. You may crave cigarettes, be irritable, feel very hungry, cough often, get headaches, or have difficulty concentrating. The withdrawal symptoms are only temporary. They are strongest when you first quit, but they will go away within 10 14 days. To reduce the chances of relapse, try to:  Avoid drinking alcohol. Drinking lowers your chances of successfully quitting.  Reduce the amount of caffeine you consume. Once you quit  smoking, the amount of caffeine in your body increases and can give you symptoms, such as a rapid heartbeat, sweating, and anxiety.  Avoid smokers because they can make you want to smoke.  Do not let weight gain distract you. Many smokers will gain weight when they quit, usually less than 10 pounds. Eat a healthy diet and stay active. You can always lose the weight gained after you quit.  Find ways to improve your mood other than smoking. FOR MORE INFORMATION  www.smokefree.gov  Document Released: 08/20/2001 Document Revised: 02/25/2012 Document Reviewed: 12/05/2011 [facility] Patient Information 2014 [place], [place].

## 2013-11-14 NOTE — ED Provider Notes (Signed)
CSN: [id number]     Arrival date & time 11/12/13  1138 History   First MD Initiated Contact with Patient 11/12/13 1300     Chief Complaint  Patient presents with  . Cough     (Consider location/radiation/quality/duration/timing/severity/associated sxs/prior Treatment) HPI Comments: Yesenia Gates is a 33 y.o. Female with a 1/2 pack per day cigarette habit presenting with a 2 month history of non productive cough, originally starting with uri symptoms, now resolved except for cough, nasal congestion and bilateral ear pressure.  She denies nasal or ear drainage, facial pain  and fever, also without shortness of breath, chest pain, wheezing and peripheral edema.  She has had a course of zithromax, steroids and has been using an albuterol mdi  without relief of the coughing.     The history is provided by the patient.    Past Medical History  Diagnosis Date  . Herniated disc   . Anxiety   . Ovarian cyst   . Abnormal Pap smear   . Chronic back pain    Past Surgical History  Procedure Laterality Date  . Dilation and cutterage     Family History  Problem Relation Age of Onset  . Diabetes Mother   . Cancer Mother   . Diabetes Father   . Cancer Other    History  Substance Use Topics  . Smoking status: Current Every Day Smoker -- 0.50 packs/day    Types: Cigarettes  . Smokeless tobacco: Never Used  . Alcohol Use: Yes     Comment: occasionally   OB History   Grav Para Term Preterm Abortions TAB SAB Ect Mult Living   2 1 1             Review of Systems  Constitutional: Negative for fever.  HENT: Positive for congestion. Negative for ear pain and sore throat.   Eyes: Negative.   Respiratory: Positive for cough. Negative for chest tightness, shortness of breath and wheezing.   Cardiovascular: Negative for chest pain.  Gastrointestinal: Negative for nausea and abdominal pain.  Genitourinary: Negative.   Musculoskeletal: Negative for arthralgias, joint swelling and neck  pain.  Skin: Negative.  Negative for rash and wound.  Neurological: Negative for dizziness, weakness, light-headedness, numbness and headaches.  Psychiatric/Behavioral: Negative.       Allergies  Penicillins  Home Medications   Current Outpatient Rx  Name  Route  Sig  Dispense  Refill  . medroxyPROGESTERone (DEPO-PROVERA) 150 MG/ML injection   Intramuscular   Inject 150 mg into the muscle every 3 (three) months.         . benzonatate (TESSALON) 100 MG capsule   Oral   Take 2 capsules (200 mg total) by mouth 3 (three) times daily as needed for cough.   30 capsule   0    BP 110/71  Pulse 87  Temp(Src) 98.2 F (36.8 C) (Oral)  Resp 20  Ht 5\' 7"  (1.702 m)  Wt 198 lb (89.812 kg)  BMI 31.00 kg/m2  SpO2 100% Physical Exam  Nursing note and vitals reviewed. Constitutional: She appears well-developed and well-nourished.  HENT:  Head: Normocephalic and atraumatic.  Right Ear: External ear normal.  Left Ear: External ear normal.  Nose: Nose normal.  Mouth/Throat: Oropharynx is clear and moist.  Eyes: Conjunctivae are normal.  Neck: Normal range of motion.  Cardiovascular: Normal rate, regular rhythm, normal heart sounds and intact distal pulses.   Pulmonary/Chest: Effort normal and breath sounds normal. No respiratory distress. She has no  wheezes.  Abdominal: Soft. Bowel sounds are normal. There is no tenderness.  Musculoskeletal: Normal range of motion. She exhibits no edema.  Neurological: She is alert.  Skin: Skin is warm and dry.  Psychiatric: She has a normal mood and affect.    ED Course  Procedures (including critical care time) Labs Review Labs Reviewed - No data to display Imaging Review No results found.   EKG Interpretation None      MDM   Final diagnoses:  Cough  Smoking 1/2 pack a day or less    Patients labs and/or radiological studies were viewed and considered during the medical decision making and disposition process. Pt with non  productive cough, normal chest xray, no chest pain or sob.  Normal vital signs, doubt PE, xray negative for edema, infiltrates.  Pt was given tessalon perles for cough, encouraged smoking cessation, advised f/u with pcp if sx persist.  The patient appears reasonably screened and/or stabilized for discharge and I doubt any other medical condition or other [facility] requiring further screening, evaluation, or treatment in the ED at this time prior to discharge.    Evalee Jefferson, PA-C 11/15/13 2153

## 2013-11-16 NOTE — ED Provider Notes (Signed)
Medical screening examination/treatment/procedure(s) were performed by non-physician practitioner and as supervising physician I was immediately available for consultation/collaboration.   EKG Interpretation None       Nat Christen, MD 11/16/13 1027

## 2013-11-22 ENCOUNTER — Ambulatory Visit: Payer: Medicaid Other | Admitting: Family Medicine

## 2014-01-25 ENCOUNTER — Other Ambulatory Visit: Payer: Self-pay

## 2014-01-25 ENCOUNTER — Emergency Department (HOSPITAL_COMMUNITY): Payer: Medicaid Other

## 2014-01-25 ENCOUNTER — Emergency Department (HOSPITAL_COMMUNITY)
Admission: EM | Admit: 2014-01-25 | Discharge: 2014-01-25 | Disposition: A | Discharge status: Home / Self Care | Payer: Medicaid Other | Attending: Emergency Medicine | Admitting: Emergency Medicine

## 2014-01-25 ENCOUNTER — Encounter (HOSPITAL_COMMUNITY): Payer: Self-pay | Admitting: Emergency Medicine

## 2014-01-25 DIAGNOSIS — F17200 Nicotine dependence, unspecified, uncomplicated: Secondary | ICD-10-CM | POA: Insufficient documentation

## 2014-01-25 DIAGNOSIS — F411 Generalized anxiety disorder: Secondary | ICD-10-CM | POA: Insufficient documentation

## 2014-01-25 DIAGNOSIS — M940 Chondrocostal junction syndrome [Tietze]: Secondary | ICD-10-CM | POA: Insufficient documentation

## 2014-01-25 DIAGNOSIS — R059 Cough, unspecified: Secondary | ICD-10-CM | POA: Insufficient documentation

## 2014-01-25 DIAGNOSIS — R079 Chest pain, unspecified: Secondary | ICD-10-CM

## 2014-01-25 DIAGNOSIS — Z8742 Personal history of other diseases of the female genital tract: Secondary | ICD-10-CM | POA: Insufficient documentation

## 2014-01-25 DIAGNOSIS — Z8739 Personal history of other diseases of the musculoskeletal system and connective tissue: Secondary | ICD-10-CM | POA: Insufficient documentation

## 2014-01-25 DIAGNOSIS — R05 Cough: Secondary | ICD-10-CM | POA: Insufficient documentation

## 2014-01-25 DIAGNOSIS — Z791 Long term (current) use of non-steroidal anti-inflammatories (NSAID): Secondary | ICD-10-CM | POA: Insufficient documentation

## 2014-01-25 DIAGNOSIS — G8929 Other chronic pain: Secondary | ICD-10-CM | POA: Insufficient documentation

## 2014-01-25 DIAGNOSIS — Z880 Allergy status to penicillin: Secondary | ICD-10-CM | POA: Insufficient documentation

## 2014-01-25 MED ORDER — NAPROXEN 500 MG PO TABS: 500.0000 mg | ORAL_TABLET | Freq: Two times a day (BID) | ORAL | Status: DC

## 2014-01-25 MED ORDER — TRAMADOL HCL 50 MG PO TABS: 50.0000 mg | ORAL_TABLET | Freq: Four times a day (QID) | ORAL | Status: DC | PRN

## 2014-01-25 NOTE — ED Notes (Signed)
Pt also c/o chest congestion, NP cough

## 2014-01-25 NOTE — ED Notes (Signed)
Patient placed on cardiac monitoring at this time

## 2014-01-25 NOTE — Discharge Instructions (Signed)
Costochondritis  Costochondritis is a condition in which the tissue (cartilage) that connects your ribs with your breastbone (sternum) becomes irritated. It causes pain in the chest and rib area. It usually goes away on its own over time.  HOME CARE  · Avoid activities that wear you out.  · Do not strain your ribs. Avoid activities that use your:  · Chest.  · Belly.  · Side muscles.  · Put ice on the area for the first 2 days after the pain starts.  · Put ice in a plastic bag.  · Place a towel between your skin and the bag.  · Leave the ice on for 20 minutes, 2 3 times a day.  · Only take medicine as told by your doctor.  GET HELP IF:  · You have redness or puffiness (swelling) in the rib area.  · Your pain does not go away with rest or medicine.  GET HELP RIGHT AWAY IF:   · Your pain gets worse.  · You are very uncomfortable.  · You have trouble breathing.  · You cough up blood.  · You start sweating or throwing up (vomiting).  · You have a fever or lasting symptoms for more than 2 3 days.  · You have a fever and your symptoms suddenly get worse.  MAKE SURE YOU:   · Understand these instructions.  · Will watch your condition.  · Will get help right away if you are not doing well or get worse.  Document Released: 02/12/2008 Document Revised: 04/28/2013 Document Reviewed: 03/30/2013  [employer]® Patient Information ©2014 [employer].

## 2014-01-25 NOTE — ED Notes (Signed)
Patient states that its sore to touch near left side area.

## 2014-01-25 NOTE — ED Notes (Signed)
Pt with left sided chest pain for weeks and has gotten worse, denies SOB or N/V

## 2014-01-27 NOTE — ED Provider Notes (Signed)
CSN: [id number]     Arrival date & time 01/25/14  2024 History   First MD Initiated Contact with Patient 01/25/14 2055     Chief Complaint  Patient presents with  . Chest Pain      HPI  Patient presents with some left-sided chest pain over last several weeks. Localized left side of her chest.  She's had a cough for the last several days. She does not feel short of breath. History Depo-Provera. Her last was 6 months ago. She is a half-pack per day smoker. No fever. Had URI symptoms last week.  Past Medical History  Diagnosis Date  . Herniated disc   . Anxiety   . Ovarian cyst   . Abnormal Pap smear   . Chronic back pain    Past Surgical History  Procedure Laterality Date  . Dilation and cutterage     Family History  Problem Relation Age of Onset  . Diabetes Mother   . Cancer Mother   . Diabetes Father   . Cancer Other    History  Substance Use Topics  . Smoking status: Current Every Day Smoker -- 0.50 packs/day    Types: Cigarettes  . Smokeless tobacco: Never Used  . Alcohol Use: Yes     Comment: occasionally   OB History   Grav Para Term Preterm Abortions TAB SAB Ect Mult Living   2 1 1             Review of Systems  Constitutional: Negative for fever, chills, diaphoresis, appetite change and fatigue.  HENT: Negative for mouth sores, sore throat and trouble swallowing.   Eyes: Negative for visual disturbance.  Respiratory: Positive for cough. Negative for chest tightness, shortness of breath and wheezing.   Cardiovascular: Positive for chest pain.  Gastrointestinal: Negative for nausea, vomiting, abdominal pain, diarrhea and abdominal distention.  Endocrine: Negative for polydipsia, polyphagia and polyuria.  Genitourinary: Negative for dysuria, frequency and hematuria.  Musculoskeletal: Negative for gait problem.  Skin: Negative for color change, pallor and rash.  Neurological: Negative for dizziness, syncope, light-headedness and headaches.  Hematological:  Does not bruise/bleed easily.  Psychiatric/Behavioral: Negative for behavioral problems and confusion.      Allergies  Penicillins  Home Medications   Prior to Admission medications   Medication Sig Start Date End Date Taking? Authorizing Provider  medroxyPROGESTERone (DEPO-PROVERA) 150 MG/ML injection Inject 150 mg into the muscle every 3 (three) months.    Historical Provider, MD  naproxen (NAPROSYN) 500 MG tablet Take 1 tablet (500 mg total) by mouth 2 (two) times daily. 01/25/14   Tanna Furry, MD  traMADol (ULTRAM) 50 MG tablet Take 1 tablet (50 mg total) by mouth every 6 (six) hours as needed. 01/25/14   Tanna Furry, MD   BP 116/73  Pulse 81  Temp(Src) 98.5 F (36.9 C) (Oral)  Resp 23  Ht 5\' 6"  (1.676 m)  Wt 200 lb (90.719 kg)  BMI 32.30 kg/m2  SpO2 96%  LMP 01/25/2014 Physical Exam  Constitutional: She is oriented to person, place, and time. She appears well-developed and well-nourished. No distress.  HENT:  Head: Normocephalic.  Eyes: Conjunctivae are normal. Pupils are equal, round, and reactive to light. No scleral icterus.  Neck: Normal range of motion. Neck supple. No thyromegaly present.  Cardiovascular: Normal rate and regular rhythm.  Exam reveals no gallop and no friction rub.   No murmur heard. Pulmonary/Chest: Effort normal and breath sounds normal. No respiratory distress. She has no wheezes. She has no  rales.    Normal pulmonary exam.  Abdominal: Soft. Bowel sounds are normal. She exhibits no distension. There is no tenderness. There is no rebound.  Musculoskeletal: Normal range of motion.  Neurological: She is alert and oriented to person, place, and time.  Skin: Skin is warm and dry. No rash noted.  Psychiatric: She has a normal mood and affect. Her behavior is normal.    ED Course  Procedures (including critical care time) Labs Review Labs Reviewed - No data to display  Imaging Review Dg Chest 2 View  01/25/2014   CLINICAL DATA:  Left-sided chest  pain tonight.  EXAM: CHEST  2 VIEW  COMPARISON:  11/12/2013  FINDINGS: Heart size and pulmonary vascularity are normal. There is slight chronic peribronchial thickening. No infiltrates or effusions. No acute osseous abnormality.  IMPRESSION: Chronic bronchitic changes.  No acute abnormalities.   Electronically Signed   By: Rozetta Nunnery M.D.   On: 01/25/2014 22:13     EKG Interpretation None      MDM   Final diagnoses:  Chest pain  Costochondritis      Normal EKG. Normal chest x-ray. Her pain is easily reproducible to palpation of the costal cartilage. She's not tachycardic or hypoxemic. I do not think this is pulmonary embolus. Plan anti-inflammatories cough suppressant recheck as needed.  Tanna Furry, MD 01/27/14 [phone]

## 2014-02-17 ENCOUNTER — Telehealth: Payer: Self-pay | Admitting: Nurse Practitioner

## 2014-03-02 NOTE — Telephone Encounter (Signed)
Pt never called back - may call for appt

## 2014-03-03 ENCOUNTER — Telehealth: Payer: Self-pay | Admitting: Nurse Practitioner

## 2014-03-03 NOTE — Telephone Encounter (Signed)
appt given  

## 2014-03-04 ENCOUNTER — Ambulatory Visit: Payer: Medicaid Other | Admitting: Family Medicine

## 2014-03-29 ENCOUNTER — Other Ambulatory Visit: Payer: Medicaid Other | Admitting: Family

## 2014-04-18 ENCOUNTER — Emergency Department (HOSPITAL_COMMUNITY)
Admission: EM | Admit: 2014-04-18 | Discharge: 2014-04-18 | Disposition: A | Discharge status: Home / Self Care | Payer: Medicaid Other | Attending: Emergency Medicine | Admitting: Emergency Medicine

## 2014-04-18 ENCOUNTER — Encounter (HOSPITAL_COMMUNITY): Payer: Self-pay | Admitting: Emergency Medicine

## 2014-04-18 DIAGNOSIS — Z79899 Other long term (current) drug therapy: Secondary | ICD-10-CM | POA: Diagnosis not present

## 2014-04-18 DIAGNOSIS — M5380 Other specified dorsopathies, site unspecified: Secondary | ICD-10-CM | POA: Insufficient documentation

## 2014-04-18 DIAGNOSIS — X500XXA Overexertion from strenuous movement or load, initial encounter: Secondary | ICD-10-CM | POA: Insufficient documentation

## 2014-04-18 DIAGNOSIS — S39012A Strain of muscle, fascia and tendon of lower back, initial encounter: Secondary | ICD-10-CM

## 2014-04-18 DIAGNOSIS — S335XXA Sprain of ligaments of lumbar spine, initial encounter: Secondary | ICD-10-CM | POA: Diagnosis not present

## 2014-04-18 DIAGNOSIS — F17200 Nicotine dependence, unspecified, uncomplicated: Secondary | ICD-10-CM | POA: Diagnosis not present

## 2014-04-18 DIAGNOSIS — Z880 Allergy status to penicillin: Secondary | ICD-10-CM | POA: Diagnosis not present

## 2014-04-18 DIAGNOSIS — F411 Generalized anxiety disorder: Secondary | ICD-10-CM | POA: Diagnosis not present

## 2014-04-18 DIAGNOSIS — Z8742 Personal history of other diseases of the female genital tract: Secondary | ICD-10-CM | POA: Insufficient documentation

## 2014-04-18 DIAGNOSIS — G8929 Other chronic pain: Secondary | ICD-10-CM | POA: Diagnosis not present

## 2014-04-18 DIAGNOSIS — IMO0002 Reserved for concepts with insufficient information to code with codable children: Secondary | ICD-10-CM | POA: Insufficient documentation

## 2014-04-18 DIAGNOSIS — Y929 Unspecified place or not applicable: Secondary | ICD-10-CM | POA: Insufficient documentation

## 2014-04-18 DIAGNOSIS — Y939 Activity, unspecified: Secondary | ICD-10-CM | POA: Insufficient documentation

## 2014-04-18 DIAGNOSIS — M62830 Muscle spasm of back: Secondary | ICD-10-CM

## 2014-04-18 MED ORDER — HYDROCODONE-ACETAMINOPHEN 5-325 MG PO TABS: 1.0000 | ORAL_TABLET | ORAL | Status: DC | PRN

## 2014-04-18 MED ORDER — IBUPROFEN 600 MG PO TABS: 600.0000 mg | ORAL_TABLET | Freq: Four times a day (QID) | ORAL | Status: DC | PRN

## 2014-04-18 MED ORDER — METHOCARBAMOL 500 MG PO TABS
1000.0000 mg | ORAL_TABLET | Freq: Four times a day (QID) | ORAL | Status: AC
Start: 2014-04-18 — End: 2014-04-28

## 2014-04-18 NOTE — ED Provider Notes (Signed)
CSN: [id number]     Arrival date & time 04/18/14  1513 History  This chart was scribed for a non-physician practitioner, Evalee Jefferson, PA-C, working with Fredia Sorrow, MD by Cathie Hoops, ED Scribe. The patient was seen in [facility]. The patient's care was started at 4:13 PM.  Chief Complaint  Patient presents with  . Back Pain   The history is provided by the patient. No language interpreter was used.   HPI Comments: Yesenia Gates is a 33 y.o. female who presents to the Emergency Department complaining of new, sudden, gradually worsening lower back pain onset three days ago. Pt describes her pain as spasms. Pt reports associated leg pain, hip pain, headache, and foot pain. Pt reports she sees [facility] but can only see them 9x/year due to Medicaid restrictions. Pt denies any recent injury to her back. Pt reports taking 2 ibuprofen 200 MG every 2 hours with minimal relief. Pt reports she took 2 ibuprofen about 4 hours ago. Pt denies fever, chills, nausea, bladder or bowel issues and vomiting. Pt states she has herniated discs in her back.  PCP: Dr. Redge Gainer. Pt reports she works at [employer]. She was last seen by [facility] for back pain 08/19/14.  Past Medical History  Diagnosis Date  . Herniated disc   . Anxiety   . Ovarian cyst   . Abnormal Pap smear   . Chronic back pain    Past Surgical History  Procedure Laterality Date  . Dilation and cutterage     Family History  Problem Relation Age of Onset  . Diabetes Mother   . Cancer Mother   . Diabetes Father   . Cancer Other    History  Substance Use Topics  . Smoking status: Current Every Day Smoker -- 0.50 packs/day    Types: Cigarettes  . Smokeless tobacco: Never Used  . Alcohol Use: Yes     Comment: occasionally   OB History   Grav Para Term Preterm Abortions TAB SAB Ect Mult Living   2 1 1             Review of Systems  Constitutional: Negative for fever.  HENT: Negative for drooling.    Respiratory: Negative for shortness of breath.   Cardiovascular: Negative for chest pain and leg swelling.  Gastrointestinal: Negative for abdominal pain, constipation and abdominal distention.  Genitourinary: Negative for dysuria, urgency, frequency, flank pain and difficulty urinating.  Musculoskeletal: Positive for arthralgias (Hip pain, foot pain and leg pain bilaterally) and back pain. Negative for gait problem and joint swelling.  Skin: Negative for rash.  Neurological: Negative for weakness and numbness.   Allergies  Penicillins  Home Medications   Prior to Admission medications   Medication Sig Start Date End Date Taking? Authorizing Provider  ibuprofen (ADVIL,MOTRIN) 200 MG tablet Take 400 mg by mouth every 6 (six) hours as needed for mild pain or moderate pain.   Yes Historical Provider, MD  medroxyPROGESTERone (DEPO-PROVERA) 150 MG/ML injection Inject 150 mg into the muscle every 3 (three) months.   Yes Historical Provider, MD  HYDROcodone-acetaminophen (NORCO/VICODIN) 5-325 MG per tablet Take 1 tablet by mouth every 4 (four) hours as needed. 04/18/14   Evalee Jefferson, PA-C  ibuprofen (ADVIL,MOTRIN) 600 MG tablet Take 1 tablet (600 mg total) by mouth every 6 (six) hours as needed. 04/18/14   Evalee Jefferson, PA-C  methocarbamol (ROBAXIN) 500 MG tablet Take 2 tablets (1,000 mg total) by mouth 4 (four) times daily. 04/18/14 04/28/14  Evalee Jefferson, PA-C   Triage Vitals: BP 105/52  Pulse 98  Temp(Src) 98.4 F (36.9 C) (Oral)  Resp 18  Ht 5\' 6"  (1.676 m)  Wt 200 lb (90.719 kg)  BMI 32.30 kg/m2  SpO2 99%  LMP 04/16/2014 Physical Exam  Nursing note and vitals reviewed. Constitutional: She appears well-developed and well-nourished.  HENT:  Head: Normocephalic.  Eyes: Conjunctivae are normal.  Neck: Normal range of motion. Neck supple.  Cardiovascular: Normal rate and intact distal pulses.   Pedal pulses normal.  Pulmonary/Chest: Effort normal.  Abdominal: Soft. Bowel sounds are normal.  She exhibits no distension and no mass.  Musculoskeletal: Normal range of motion. She exhibits no edema.       Lumbar back: She exhibits tenderness. She exhibits no swelling, no edema and no spasm.  Neurological: She is alert. She has normal strength. She displays no atrophy and no tremor. No sensory deficit. Gait normal.  Reflex Scores:      Patellar reflexes are 2+ on the right side and 2+ on the left side.      Achilles reflexes are 2+ on the right side and 2+ on the left side. No strength deficit noted in hip and knee flexor and extensor muscle groups.  Ankle flexion and extension intact.  Skin: Skin is warm and dry.  Psychiatric: She has a normal mood and affect.    ED Course  Procedures (including critical care time) DIAGNOSTIC STUDIES: Oxygen Saturation is 99% on RA, normal by my interpretation.    COORDINATION OF CARE: 4:19 PM- Patient informed of current plan for treatment and evaluation and agrees with plan at this time.  Labs Review Labs Reviewed - No data to display  Imaging Review No results found.   EKG Interpretation None      MDM   Final diagnoses:  Lumbar strain, initial encounter  Muscle spasm of back    No neuro deficit on exam or by history to suggest emergent or surgical presentation.  Also discussed worsened sx that should prompt immediate re-evaluation including distal weakness, bowel/bladder retention/incontinence.  Pt prescribed hydrocodone, robaxin, ibuprofen.  Plan f/u with pcp/ ortho prn.  I personally performed the services described in this documentation, which was scribed in my presence. The recorded information has been reviewed and is accurate.       Evalee Jefferson, PA-C 04/20/14 [phone]

## 2014-04-18 NOTE — Discharge Instructions (Signed)
Back Pain, Adult °Low back pain is very common. About 1 in 5 people have back pain. The cause of low back pain is rarely dangerous. The pain often gets better over time. About half of people with a sudden onset of back pain feel better in just 2 weeks. About 8 in 10 people feel better by 6 weeks.  °CAUSES °Some common causes of back pain include: °· Strain of the muscles or ligaments supporting the spine. °· Wear and tear (degeneration) of the spinal discs. °· Arthritis. °· Direct injury to the back. °DIAGNOSIS °Most of the time, the direct cause of low back pain is not known. However, back pain can be treated effectively even when the exact cause of the pain is unknown. Answering your caregiver's questions about your overall health and symptoms is one of the most accurate ways to make sure the cause of your pain is not dangerous. If your caregiver needs more information, he or she may order lab work or imaging tests (X-rays or MRIs). However, even if imaging tests show changes in your back, this usually does not require surgery. °HOME CARE INSTRUCTIONS °For many people, back pain returns. Since low back pain is rarely dangerous, it is often a condition that people can learn to manage on their own.  °· Remain active. It is stressful on the back to sit or stand in one place. Do not sit, drive, or stand in one place for more than 30 minutes at a time. Take short walks on level surfaces as soon as pain allows. Try to increase the length of time you walk each day. °· Do not stay in bed. Resting more than 1 or 2 days can delay your recovery. °· Do not avoid exercise or work. Your body is made to move. It is not dangerous to be active, even though your back may hurt. Your back will likely heal faster if you return to being active before your pain is gone. °· Pay attention to your body when you  bend and lift. Many people have less discomfort when lifting if they bend their knees, keep the load close to their bodies, and  avoid twisting. Often, the most comfortable positions are those that put less stress on your recovering back. °· Find a comfortable position to sleep. Use a firm mattress and lie on your side with your knees slightly bent. If you lie on your back, put a pillow under your knees. °· Only take over-the-counter or prescription medicines as directed by your caregiver. Over-the-counter medicines to reduce pain and inflammation are often the most helpful. Your caregiver may prescribe muscle relaxant drugs. These medicines help dull your pain so you can more quickly return to your normal activities and healthy exercise. °· Put ice on the injured area. °¨ Put ice in a plastic bag. °¨ Place a towel between your skin and the bag. °¨ Leave the ice on for 15-20 minutes, 03-04 times a day for the first 2 to 3 days. After that, ice and heat may be alternated to reduce pain and spasms. °· Ask your caregiver about trying back exercises and gentle massage. This may be of some benefit. °· Avoid feeling anxious or stressed. Stress increases muscle tension and can worsen back pain. It is important to recognize when you are anxious or stressed and learn ways to manage it. Exercise is a great option. °SEEK MEDICAL CARE IF: °· You have pain that is not relieved with rest or medicine. °· You have pain that does not improve in 1 week. °· You have new symptoms. °· You are generally not feeling well. °SEEK   IMMEDIATE MEDICAL CARE IF:   You have pain that radiates from your back into your legs.  You develop new bowel or bladder control problems.  You have unusual weakness or numbness in your arms or legs.  You develop nausea or vomiting.  You develop abdominal pain.  You feel faint. Document Released: 08/26/2005 Document Revised: 02/25/2012 Document Reviewed: 12/28/2013 [facility] Patient Information 2015 [place], [place]. This information is not intended to replace advice given to you by your health care provider. Make sure you  discuss any questions you have with your health care provider.     Use the the other medicines as directed.  Do not drive within 4 hours of taking hydrocodone as this will make you drowsy.  Avoid lifting,  Bending,  Twisting or any other activity that worsens your pain over the next week.  Apply an  icepack  to your lower back for 10-15 minutes every 2 hours for the next 2 days.  You should get rechecked if your symptoms are not better over the next 5 days,  Or you develop increased pain,  Weakness in your leg(s) or loss of bladder or bowel function - these are symptoms of a worse injury.

## 2014-04-18 NOTE — ED Notes (Signed)
Pt c/o spasms in lower back since yesterday.  Denies injury.

## 2014-04-18 NOTE — ED Notes (Signed)
Pain low back with radiation down both legs. Pain rt hip. No injury.  Hx of back pain

## 2014-04-21 NOTE — ED Provider Notes (Signed)
Medical screening examination/treatment/procedure(s) were performed by non-physician practitioner and as supervising physician I was immediately available for consultation/collaboration.   EKG Interpretation None        Fredia Sorrow, MD 04/21/14 0403

## 2014-04-22 ENCOUNTER — Encounter (HOSPITAL_COMMUNITY): Payer: Self-pay | Admitting: Emergency Medicine

## 2014-04-22 ENCOUNTER — Emergency Department (HOSPITAL_COMMUNITY)
Admission: EM | Admit: 2014-04-22 | Discharge: 2014-04-22 | Disposition: A | Discharge status: Home / Self Care | Payer: Medicaid Other | Attending: Emergency Medicine | Admitting: Emergency Medicine

## 2014-04-22 DIAGNOSIS — G8929 Other chronic pain: Secondary | ICD-10-CM | POA: Diagnosis not present

## 2014-04-22 DIAGNOSIS — Z8742 Personal history of other diseases of the female genital tract: Secondary | ICD-10-CM | POA: Diagnosis not present

## 2014-04-22 DIAGNOSIS — R51 Headache: Secondary | ICD-10-CM | POA: Insufficient documentation

## 2014-04-22 DIAGNOSIS — F17200 Nicotine dependence, unspecified, uncomplicated: Secondary | ICD-10-CM | POA: Insufficient documentation

## 2014-04-22 DIAGNOSIS — Z791 Long term (current) use of non-steroidal anti-inflammatories (NSAID): Secondary | ICD-10-CM | POA: Diagnosis not present

## 2014-04-22 DIAGNOSIS — F411 Generalized anxiety disorder: Secondary | ICD-10-CM | POA: Diagnosis not present

## 2014-04-22 DIAGNOSIS — Z79899 Other long term (current) drug therapy: Secondary | ICD-10-CM | POA: Insufficient documentation

## 2014-04-22 DIAGNOSIS — Z880 Allergy status to penicillin: Secondary | ICD-10-CM | POA: Diagnosis not present

## 2014-04-22 DIAGNOSIS — R519 Headache, unspecified: Secondary | ICD-10-CM

## 2014-04-22 DIAGNOSIS — F419 Anxiety disorder, unspecified: Secondary | ICD-10-CM

## 2014-04-22 MED ORDER — HYDROXYZINE HCL 25 MG PO TABS: 25.0000 mg | ORAL_TABLET | Freq: Four times a day (QID) | ORAL | Status: DC | PRN

## 2014-04-22 NOTE — ED Provider Notes (Signed)
CSN: [id number]     Arrival date & time 04/22/14  1734 History   First MD Initiated Contact with Patient 04/22/14 1807     Chief Complaint  Patient presents with  . Headache     (Consider location/radiation/quality/duration/timing/severity/associated sxs/prior Treatment) HPI  JAYDENCE VANYO is a 33 y.o. female complaining of right temple pressure-like sensation onset this morning. Patient states that she had a pain on the right temporal area starting about 5 days ago. Patient was seen for exacerbation of chronic low back pain and was given prescriptions for Robaxin, Vicodin and Motrin and she is concerned that the headache is not improving. She denies pain but states that there is a pressure-like sensation. Patient states that she also has a history of anxiety, depression and OCD. She states that she is not taking these medications the patient is not likely side effects. Pt denies fever, rash, confusion, cervicalgia, LOC/syncope, change in vision, N/V, numbness, weakness, dysarthria, ataxia, thunderclap onset, exacerbation with exertion or valsalva, exacerbation in morning, CP, SOB, abdominal pain.   Past Medical History  Diagnosis Date  . Herniated disc   . Anxiety   . Ovarian cyst   . Abnormal Pap smear   . Chronic back pain    Past Surgical History  Procedure Laterality Date  . Dilation and cutterage     Family History  Problem Relation Age of Onset  . Diabetes Mother   . Cancer Mother   . Diabetes Father   . Cancer Other    History  Substance Use Topics  . Smoking status: Current Every Day Smoker -- 0.50 packs/day    Types: Cigarettes  . Smokeless tobacco: Never Used  . Alcohol Use: Yes     Comment: occasionally   OB History   Grav Para Term Preterm Abortions TAB SAB Ect Mult Living   2 1 1             Review of Systems  10 systems reviewed and found to be negative, except as noted in the HPI.  Allergies  Penicillins  Home Medications   Prior to  Admission medications   Medication Sig Start Date End Date Taking? Authorizing Provider  HYDROcodone-acetaminophen (NORCO/VICODIN) 5-325 MG per tablet Take 1 tablet by mouth every 4 (four) hours as needed. 04/18/14   Evalee Jefferson, PA-C  hydrOXYzine (ATARAX/VISTARIL) 25 MG tablet Take 1 tablet (25 mg total) by mouth every 6 (six) hours as needed for anxiety. 04/22/14   Alvia Tory, PA-C  ibuprofen (ADVIL,MOTRIN) 200 MG tablet Take 400 mg by mouth every 6 (six) hours as needed for mild pain or moderate pain.    Historical Provider, MD  ibuprofen (ADVIL,MOTRIN) 600 MG tablet Take 1 tablet (600 mg total) by mouth every 6 (six) hours as needed. 04/18/14   Evalee Jefferson, PA-C  medroxyPROGESTERone (DEPO-PROVERA) 150 MG/ML injection Inject 150 mg into the muscle every 3 (three) months.    Historical Provider, MD  methocarbamol (ROBAXIN) 500 MG tablet Take 2 tablets (1,000 mg total) by mouth 4 (four) times daily. 04/18/14 04/28/14  Evalee Jefferson, PA-C   BP 109/60  Pulse 67  Temp(Src) 98.9 F (37.2 C) (Oral)  Resp 18  SpO2 100%  LMP 04/16/2014 Physical Exam  Nursing note and vitals reviewed. Constitutional: She is oriented to person, place, and time. She appears well-developed and well-nourished. No distress.  HENT:  Head: Normocephalic and atraumatic.  Mouth/Throat: Oropharynx is clear and moist.  A lateral tympanic membranes with normal architecture and good light reflex.  No dental caries appreciated.  No TMJ clicking or pain.  Eyes: Conjunctivae and EOM are normal. Pupils are equal, round, and reactive to light.  Neck: Normal range of motion. Neck supple.  FROM to C-spine. Pt can touch chin to chest without discomfort. No TTP of midline cervical spine.   Cardiovascular: Normal rate, regular rhythm and intact distal pulses.   Pulmonary/Chest: Effort normal and breath sounds normal. No stridor. No respiratory distress. She has no wheezes. She has no rales. She exhibits no tenderness.  Abdominal:  Soft. Bowel sounds are normal.  Musculoskeletal: Normal range of motion.  Neurological: She is alert and oriented to person, place, and time.  II-Visual fields grossly intact. III/IV/VI-Extraocular movements intact.  Pupils reactive bilaterally. V/VII-Smile symmetric, equal eyebrow raise,  facial sensation intact VIII- Hearing grossly intact IX/X-Normal gag XI-bilateral shoulder shrug XII-midline tongue extension Motor: 5/5 bilaterally with normal tone and bulk Cerebellar: Normal finger-to-nose  and normal heel-to-shin test.   Romberg negative Ambulates with a coordinated gait   Psychiatric: She has a normal mood and affect.    ED Course  Procedures (including critical care time) Labs Review Labs Reviewed - No data to display  Imaging Review No results found.   EKG Interpretation None      MDM   Final diagnoses:  Acute nonintractable headache, unspecified headache type  Anxiety    Filed Vitals:   04/22/14 1751  BP: 109/60  Pulse: 67  Temp: 98.9 F (37.2 C)  TempSrc: Oral  Resp: 18  SpO2: 100%    Medications - No data to display  KASSY MCENROE is a 33 y.o. female presenting with right temporal pressure in her head. Neuro exam is nonfocal. Advised the patient that I do not think she needs a CAT scan. I have reassured her that I do not think the headache is symptom of a significant underlying pathology. I have offered her CT which in shared decision-making capacity she has declined. Patient advises me that she has anxiety and she has been stressed lately. She's not taking any of her psychiatric medications. I have advised her to follow with her counselor to discuss therapy rather than medication.  Evaluation does not show pathology that would require ongoing emergent intervention or inpatient treatment. Pt is hemodynamically stable and mentating appropriately. Discussed findings and plan with patient/guardian, who agrees with care plan. All questions answered.  Return precautions discussed and outpatient follow up given.   New Prescriptions   HYDROXYZINE (ATARAX/VISTARIL) 25 MG TABLET    Take 1 tablet (25 mg total) by mouth every 6 (six) hours as needed for anxiety.         Monico Blitz, PA-C 04/22/14 1853

## 2014-04-22 NOTE — ED Notes (Signed)
PT c/o right sided headache and pressure x4 days. PT denies any injury. PT denies any light sensitivity or sound sensitivity. PT was seen at ER for back pain a few days ago and pt states the pain medication was not effective.

## 2014-04-22 NOTE — Discharge Instructions (Signed)
Please follow with your primary care doctor in the next 2 days for a check-up. They must obtain records for further management.   Do not hesitate to return to the Emergency Department for any new, worsening or concerning symptoms.    General Headache Without Cause A headache is pain or discomfort felt around the head or neck area. The specific cause of a headache may not be found. There are many causes and types of headaches. A few common ones are:  Tension headaches.  Migraine headaches.  Cluster headaches.  Chronic daily headaches. HOME CARE INSTRUCTIONS   Keep all follow-up appointments with your caregiver or any specialist referral.  Only take over-the-counter or prescription medicines for pain or discomfort as directed by your caregiver.  Lie down in a dark, quiet room when you have a headache.  Keep a headache journal to find out what may trigger your migraine headaches. For example, write down:  What you eat and drink.  How much sleep you get.  Any change to your diet or medicines.  Try massage or other relaxation techniques.  Put ice packs or heat on the head and neck. Use these 3 to 4 times per day for 15 to 20 minutes each time, or as needed.  Limit stress.  Sit up straight, and do not tense your muscles.  Quit smoking if you smoke.  Limit alcohol use.  Decrease the amount of caffeine you drink, or stop drinking caffeine.  Eat and sleep on a regular schedule.  Get 7 to 9 hours of sleep, or as recommended by your caregiver.  Keep lights dim if bright lights bother you and make your headaches worse. SEEK MEDICAL CARE IF:   You have problems with the medicines you were prescribed.  Your medicines are not working.  You have a change from the usual headache.  You have nausea or vomiting. SEEK IMMEDIATE MEDICAL CARE IF:   Your headache becomes severe.  You have a fever.  You have a stiff neck.  You have loss of vision.  You have muscular  weakness or loss of muscle control.  You start losing your balance or have trouble walking.  You feel faint or pass out.  You have severe symptoms that are different from your first symptoms. MAKE SURE YOU:   Understand these instructions.  Will watch your condition.  Will get help right away if you are not doing well or get worse. Document Released: 08/26/2005 Document Revised: 11/18/2011 Document Reviewed: 09/11/2011 [facility] Patient Information 2015 [place], [place]. This information is not intended to replace advice given to you by your health care provider. Make sure you discuss any questions you have with your health care provider.

## 2014-04-25 NOTE — ED Provider Notes (Signed)
Medical screening examination/treatment/procedure(s) were performed by non-physician practitioner and as supervising physician I was immediately available for consultation/collaboration.   EKG Interpretation None        Francine Graven, DO 04/25/14 1522

## 2014-04-27 ENCOUNTER — Telehealth: Payer: Self-pay | Admitting: Nurse Practitioner

## 2014-04-27 MED ORDER — FLUCONAZOLE 150 MG PO TABS: 150.0000 mg | ORAL_TABLET | Freq: Once | ORAL | Status: DC

## 2014-04-27 NOTE — Telephone Encounter (Signed)
I sent the Rx to the pharmacy.

## 2014-05-03 ENCOUNTER — Telehealth: Payer: Self-pay | Admitting: Nurse Practitioner

## 2014-05-03 NOTE — Telephone Encounter (Signed)
Request that was received was rx for yeast infection and that is why diflucan was sent in- if thinks is bacterial ntbs

## 2014-05-04 NOTE — Telephone Encounter (Signed)
Pt aware and appt made.

## 2014-05-06 ENCOUNTER — Encounter: Payer: Self-pay | Admitting: Nurse Practitioner

## 2014-05-06 ENCOUNTER — Other Ambulatory Visit: Payer: Self-pay | Admitting: Nurse Practitioner

## 2014-05-06 ENCOUNTER — Ambulatory Visit (INDEPENDENT_AMBULATORY_CARE_PROVIDER_SITE_OTHER): Payer: Medicaid Other | Admitting: Nurse Practitioner

## 2014-05-06 VITALS — BP 98/66 | HR 88 | Temp 98.2°F | Ht 66.0 in | Wt 207.0 lb

## 2014-05-06 DIAGNOSIS — N6320 Unspecified lump in the left breast, unspecified quadrant: Secondary | ICD-10-CM

## 2014-05-06 DIAGNOSIS — N898 Other specified noninflammatory disorders of vagina: Secondary | ICD-10-CM

## 2014-05-06 DIAGNOSIS — N63 Unspecified lump in unspecified breast: Secondary | ICD-10-CM

## 2014-05-06 DIAGNOSIS — B9689 Other specified bacterial agents as the cause of diseases classified elsewhere: Secondary | ICD-10-CM

## 2014-05-06 DIAGNOSIS — N760 Acute vaginitis: Secondary | ICD-10-CM

## 2014-05-06 DIAGNOSIS — A499 Bacterial infection, unspecified: Secondary | ICD-10-CM

## 2014-05-06 LAB — POCT WET PREP WITH KOH
KOH Prep POC: NEGATIVE
RBC Wet Prep HPF POC: NEGATIVE
Trichomonas, UA: NEGATIVE
WBC WET PREP PER HPF POC: NEGATIVE
YEAST WET PREP PER HPF POC: NEGATIVE

## 2014-05-06 MED ORDER — METRONIDAZOLE 500 MG PO TABS: 500.0000 mg | ORAL_TABLET | Freq: Three times a day (TID) | ORAL | Status: DC

## 2014-05-06 NOTE — Progress Notes (Signed)
   Subjective:    Patient ID: Yesenia Gates, female    DOB: [birth year], 33 y.o.   MRN: [id number]  HPI Patient in today with 2 complaints: - Lump in left breast- Just found 2 days ago- painful to touch -vaginal discharge- STarted discharge that started  A couple of weeks ago- she has not been sexually active in over a year    Review of Systems  Constitutional: Negative.   HENT: Negative.   Respiratory: Negative.   Cardiovascular: Negative.   Genitourinary: Positive for vaginal discharge.  Musculoskeletal: Negative.   Psychiatric/Behavioral: Negative.   All other systems reviewed and are negative.      Objective:   Physical Exam  Constitutional: She is oriented to person, place, and time. She appears well-developed and well-nourished.  Cardiovascular: Normal rate, regular rhythm and normal heart sounds.   Pulmonary/Chest: Effort normal and breath sounds normal. Right breast exhibits no inverted nipple, no mass, no nipple discharge, no skin change and no tenderness. Left breast exhibits mass and tenderness. Left breast exhibits no inverted nipple, no nipple discharge and no skin change.    Genitourinary:  Self wet prep.  Neurological: She is alert and oriented to person, place, and time.  Skin: Skin is warm.  Psychiatric: She has a normal mood and affect. Her behavior is normal. Judgment and thought content normal.   BP 98/66  Pulse 88  Temp(Src) 98.2 F (36.8 C) (Oral)  Ht 5\' 6"  (1.676 m)  Wt 207 lb (93.895 kg)  BMI 33.43 kg/m2  LMP 04/16/2014   Results for orders placed in visit on 05/06/14  POCT WET PREP WITH KOH      Result Value Ref Range   Trichomonas, UA Negative     Clue Cells Wet Prep HPF POC occ     Epithelial Wet Prep HPF POC many     Yeast Wet Prep HPF POC neg     Bacteria Wet Prep HPF POC many     RBC Wet Prep HPF POC neg     WBC Wet Prep HPF POC neg     KOH Prep POC Negative            Assessment & Plan:  1. Vaginal discharge - POCT Wet  Prep with KOH  2. Bacterial vaginosis Avoid bubble baths - metroNIDAZOLE (FLAGYL) 500 MG tablet; Take 1 tablet (500 mg total) by mouth 3 (three) times daily.  Dispense: 14 tablet; Refill: 0  3. Left breast mass Will wait on report from [place]; Future  Mary-Margaret Hassell Done, FNP

## 2014-05-06 NOTE — Patient Instructions (Signed)
Bacterial Vaginosis Bacterial vaginosis is a vaginal infection that occurs when the normal balance of bacteria in the vagina is disrupted. It results from an overgrowth of certain bacteria. This is the most common vaginal infection in women of childbearing age. Treatment is important to prevent complications, especially in pregnant women, as it can cause a premature delivery. CAUSES  Bacterial vaginosis is caused by an increase in harmful bacteria that are normally present in smaller amounts in the vagina. Several different kinds of bacteria can cause bacterial vaginosis. However, the reason that the condition develops is not fully understood. RISK FACTORS Certain activities or behaviors can put you at an increased risk of developing bacterial vaginosis, including:  Having a new sex partner or multiple sex partners.  Douching.  Using an intrauterine device (IUD) for contraception. Women do not get bacterial vaginosis from toilet seats, bedding, swimming pools, or contact with objects around them. SIGNS AND SYMPTOMS  Some women with bacterial vaginosis have no signs or symptoms. Common symptoms include:  Grey vaginal discharge.  A fishlike odor with discharge, especially after sexual intercourse.  Itching or burning of the vagina and vulva.  Burning or pain with urination. DIAGNOSIS  Your health care provider will take a medical history and examine the vagina for signs of bacterial vaginosis. A sample of vaginal fluid may be taken. Your health care provider will look at this sample under a microscope to check for bacteria and abnormal cells. A vaginal pH test may also be done.  TREATMENT  Bacterial vaginosis may be treated with antibiotic medicines. These may be given in the form of a pill or a vaginal cream. A second round of antibiotics may be prescribed if the condition comes back after treatment.  HOME CARE INSTRUCTIONS   Only take over-the-counter or prescription medicines as  directed by your health care provider.  If antibiotic medicine was prescribed, take it as directed. Make sure you finish it even if you start to feel better.  Do not have sex until treatment is completed.  Tell all sexual partners that you have a vaginal infection. They should see their health care provider and be treated if they have problems, such as a mild rash or itching.  Practice safe sex by using condoms and only having one sex partner. SEEK MEDICAL CARE IF:   Your symptoms are not improving after 3 days of treatment.  You have increased discharge or pain.  You have a fever. MAKE SURE YOU:   Understand these instructions.  Will watch your condition.  Will get help right away if you are not doing well or get worse. FOR MORE INFORMATION  Centers for Disease Control and Prevention, Division of STD Prevention: www.cdc.gov/std American Sexual Health Association (ASHA): www.ashastd.org  Document Released: 08/26/2005 Document Revised: 06/16/2013 Document Reviewed: 04/07/2013 ExitCare Patient Information 2015 [employer]. This information is not intended to replace advice given to you by your health care provider. Make sure you discuss any questions you have with your health care provider.  

## 2014-05-17 ENCOUNTER — Ambulatory Visit (HOSPITAL_COMMUNITY)
Admission: RE | Admit: 2014-05-17 | Discharge: 2014-05-17 | Disposition: A | Discharge status: Home / Self Care | Payer: Medicaid Other | Source: Ambulatory Visit | Attending: Nurse Practitioner | Admitting: Nurse Practitioner

## 2014-05-17 DIAGNOSIS — N63 Unspecified lump in unspecified breast: Secondary | ICD-10-CM | POA: Diagnosis not present

## 2014-05-17 DIAGNOSIS — N6320 Unspecified lump in the left breast, unspecified quadrant: Secondary | ICD-10-CM

## 2014-05-20 ENCOUNTER — Ambulatory Visit (INDEPENDENT_AMBULATORY_CARE_PROVIDER_SITE_OTHER): Payer: Medicaid Other | Admitting: Family

## 2014-05-20 VITALS — BP 113/75 | HR 91 | Temp 97.1°F | Ht 66.0 in | Wt 208.4 lb

## 2014-05-20 DIAGNOSIS — Z0000 Encounter for general adult medical examination without abnormal findings: Secondary | ICD-10-CM

## 2014-05-20 DIAGNOSIS — Z01419 Encounter for gynecological examination (general) (routine) without abnormal findings: Secondary | ICD-10-CM

## 2014-05-20 DIAGNOSIS — Z7251 High risk heterosexual behavior: Secondary | ICD-10-CM

## 2014-05-20 DIAGNOSIS — Z1321 Encounter for screening for nutritional disorder: Secondary | ICD-10-CM

## 2014-05-20 DIAGNOSIS — Z124 Encounter for screening for malignant neoplasm of cervix: Secondary | ICD-10-CM

## 2014-05-20 LAB — POCT WET PREP (WET MOUNT)
KOH WET PREP POC: NEGATIVE
Trichomonas Wet Prep HPF POC: NEGATIVE

## 2014-05-20 LAB — POCT UA - MICROSCOPIC ONLY
Bacteria, U Microscopic: NEGATIVE
CRYSTALS, UR, HPF, POC: NEGATIVE
Casts, Ur, LPF, POC: NEGATIVE
MUCUS UA: NEGATIVE
WBC, Ur, HPF, POC: NEGATIVE
Yeast, UA: NEGATIVE

## 2014-05-20 LAB — POCT URINALYSIS DIPSTICK
BILIRUBIN UA: NEGATIVE
GLUCOSE UA: NEGATIVE
KETONES UA: NEGATIVE
Leukocytes, UA: NEGATIVE
Nitrite, UA: NEGATIVE
Protein, UA: NEGATIVE
SPEC GRAV UA: 1.025
UROBILINOGEN UA: NEGATIVE
pH, UA: 6

## 2014-05-20 NOTE — Addendum Note (Signed)
Addended by: Earlene Plater on: 05/20/2014 12:08 PM   Modules accepted: Orders

## 2014-05-20 NOTE — Patient Instructions (Signed)

## 2014-05-20 NOTE — Progress Notes (Signed)
   Subjective:    Patient ID: Yesenia Gates, female    DOB: May 27, 1981, 33 y.o.   MRN: [id number]  HPI Pt presents to office for annual with pap. Pt currently not taking any medications at this time. Pt denies any pain, SOB, palpations, or edema. Pt states she was treated for BV a few weeks ago and would like to be tested to make sure it has completely resolved.    Review of Systems  Constitutional: Negative.   HENT: Negative.   Eyes: Negative.   Respiratory: Negative.  Negative for shortness of breath.   Cardiovascular: Negative.  Negative for palpitations.  Gastrointestinal: Negative.   Endocrine: Negative.   Genitourinary: Negative.   Musculoskeletal: Negative.   Neurological: Negative.  Negative for headaches.  Hematological: Negative.   Psychiatric/Behavioral: Negative.   All other systems reviewed and are negative.      Objective:   Physical Exam  Vitals reviewed. Constitutional: She is oriented to person, place, and time. She appears well-developed and well-nourished. No distress.  HENT:  Head: Normocephalic and atraumatic.  Right Ear: External ear normal.  Mouth/Throat: Oropharynx is clear and moist.  Eyes: Pupils are equal, round, and reactive to light.  Neck: Normal range of motion. Neck supple. No thyromegaly present.  Cardiovascular: Normal rate, regular rhythm, normal heart sounds and intact distal pulses.   No murmur heard. Pulmonary/Chest: Effort normal and breath sounds normal. No respiratory distress. She has no wheezes. Right breast exhibits no inverted nipple, no mass, no nipple discharge, no skin change and no tenderness. Left breast exhibits no inverted nipple, no mass, no nipple discharge, no skin change and no tenderness. Breasts are symmetrical.  Abdominal: Soft. Bowel sounds are normal. She exhibits no distension. There is no tenderness.  Genitourinary: Vagina normal.  Bimanual exam- no adnexal masses or tenderness, ovaries nonpalpable   Cervix  parous and pink- No discharge   Musculoskeletal: Normal range of motion. She exhibits no edema and no tenderness.  Neurological: She is alert and oriented to person, place, and time. She has normal reflexes. No cranial nerve deficit.  Skin: Skin is warm and dry.  Psychiatric: She has a normal mood and affect. Her behavior is normal. Judgment and thought content normal.    BP 113/75  Pulse 91  Temp(Src) 97.1 F (36.2 C) (Oral)  Ht $R'5\' 6"'RI$  (1.676 m)  Wt 208 lb 6.4 oz (94.53 kg)  BMI 33.65 kg/m2  LMP 05/14/2014       Assessment & Plan:  1. Annual physical exam - POCT urinalysis dipstick - POCT UA - Microscopic Only - POCT Wet Prep [facility]) - CMP14+EGFR - Lipid panel - Thyroid Panel With TSH - Vit D  25 hydroxy (rtn osteoporosis monitoring) - Pap IG w/ reflex to HPV when ASC-U  2. Encounter for routine gynecological examination - Pap IG w/ reflex to HPV when ASC-U  3. Encounter for vitamin deficiency screening - Vit D  25 hydroxy (rtn osteoporosis monitoring)   Continue all meds Labs pending Health Maintenance reviewed Diet and exercise encouraged RTO 1 year  Evelina Dun, FNP

## 2014-05-21 ENCOUNTER — Telehealth: Payer: Self-pay | Admitting: Family

## 2014-05-21 LAB — STD SCREENING PANEL/HIGH RISK
HIV 1/O/2 Abs-Index Value: <1
HIV-1/HIV-2 Ab: NONREACTIVE
HSV 1 IGG,TYPE SPECIFIC AB: <0.91 {index} (ref 0.00–0.90)
HSV 2 IGG,TYPE SPECIFIC AB: 10.6 {index} — ABNORMAL HIGH (ref 0.00–0.90)
Hep A IgM: NEGATIVE
Hep B C IgM: NEGATIVE
Hep C Virus Ab: <0.1 {s_co_ratio} (ref 0.0–0.9)
Hepatitis B Surface Ag: NEGATIVE
RPR: NONREACTIVE

## 2014-05-21 LAB — THYROID PANEL WITH TSH
Free Thyroxine Index: 1.9 (ref 1.2–4.9)
T3 Uptake Ratio: 27 % (ref 24–39)
T4, Total: 7.1 ug/dL (ref 4.5–12.0)
TSH: 1.37 u[IU]/mL (ref 0.450–4.500)

## 2014-05-21 LAB — CMP14+EGFR
ALT: 21 [IU]/L (ref 0–32)
AST: 17 [IU]/L (ref 0–40)
Albumin/Globulin Ratio: 2.2 (ref 1.1–2.5)
Albumin: 4.6 g/dL (ref 3.5–5.5)
Alkaline Phosphatase: 83 [IU]/L (ref 39–117)
BUN/Creatinine Ratio: 9 (ref 8–20)
BUN: 7 mg/dL (ref 6–20)
CO2: 24 mmol/L (ref 18–29)
Calcium: 9.5 mg/dL (ref 8.7–10.2)
Chloride: 101 mmol/L (ref 97–108)
Creatinine, Ser: 0.8 mg/dL (ref 0.57–1.00)
GFR calc Af Amer: 110 mL/min/{1.73_m2}
GFR calc non Af Amer: 96 mL/min/{1.73_m2}
Globulin, Total: 2.1 g/dL (ref 1.5–4.5)
Glucose: 83 mg/dL (ref 65–99)
Potassium: 4.1 mmol/L (ref 3.5–5.2)
Sodium: 140 mmol/L (ref 134–144)
Total Bilirubin: 0.2 mg/dL (ref 0.0–1.2)
Total Protein: 6.7 g/dL (ref 6.0–8.5)

## 2014-05-21 LAB — VITAMIN D 25 HYDROXY (VIT D DEFICIENCY, FRACTURES): VIT D 25 HYDROXY: 24.7 ng/mL — AB (ref 30.0–100.0)

## 2014-05-21 LAB — LIPID PANEL
CHOLESTEROL TOTAL: 222 mg/dL — AB (ref 100–199)
Chol/HDL Ratio: 5 ratio units — ABNORMAL HIGH (ref 0.0–4.4)
HDL: 44 mg/dL (ref >39)
LDL CALC: 140 mg/dL — AB (ref 0–99)
TRIGLYCERIDES: 189 mg/dL — AB (ref 0–149)
VLDL Cholesterol Cal: 38 mg/dL (ref 5–40)

## 2014-05-23 ENCOUNTER — Other Ambulatory Visit: Payer: Self-pay | Admitting: Family

## 2014-05-23 MED ORDER — ATORVASTATIN CALCIUM 40 MG PO TABS: 40.0000 mg | ORAL_TABLET | Freq: Every day | ORAL | Status: DC

## 2014-05-23 MED ORDER — VITAMIN D (ERGOCALCIFEROL) 1.25 MG (50000 UNIT) PO CAPS: 50000.0000 [IU] | ORAL_CAPSULE | ORAL | Status: DC

## 2014-05-24 ENCOUNTER — Telehealth: Payer: Self-pay | Admitting: *Deleted

## 2014-05-24 NOTE — Telephone Encounter (Signed)
Patient aware she needs to come by for results

## 2014-05-24 NOTE — Telephone Encounter (Signed)
Message copied by Marin Olp on Tue May 24, 2014 11:49 AM ------      Message from: Lenna Gilford, [place] A      Created: Mon May 23, 2014  1:10 PM       Wet prep negative      Urine negative      Kidney and liver function stable      Cholesterol levels elevated- RX sent to pharmacy- Pt needs to be aware that this medication could cause fatal harm and pt needs to prevent pregnancy while taking      Thyroid levels WNL      Vit D levels low- RX sent to pharmacy      STD Panel (Hepatititis , HIV) Negative      Herpes positive- Test is not very reliable- If ever had out break of lesion on vaginal area would need to be swabbed ------

## 2014-05-24 NOTE — Telephone Encounter (Signed)
Pt notified of results Verbalizes understanding 

## 2014-05-25 ENCOUNTER — Telehealth: Payer: Self-pay | Admitting: Nurse Practitioner

## 2014-05-25 LAB — PAP IG W/ RFLX HPV ASCU: PAP Smear Comment: 0

## 2014-05-25 NOTE — Telephone Encounter (Signed)
NTBS to discuss 

## 2014-05-26 NOTE — Telephone Encounter (Signed)
appointement made

## 2014-05-27 ENCOUNTER — Ambulatory Visit: Payer: Self-pay | Admitting: Family

## 2014-05-30 ENCOUNTER — Telehealth: Payer: Self-pay | Admitting: Family Medicine

## 2014-05-30 NOTE — Telephone Encounter (Signed)
Has been waking up with headaches since starting atorvastatin.  She has also experienced some problems with disorientation during the night since starting it.

## 2014-05-31 MED ORDER — ROSUVASTATIN CALCIUM 20 MG PO TABS: 20.0000 mg | ORAL_TABLET | Freq: Every day | ORAL | Status: DC

## 2014-05-31 NOTE — Telephone Encounter (Signed)
Pt aware by detailed vm. 

## 2014-05-31 NOTE — Telephone Encounter (Signed)
Stop taking Lipitor. New rx of Crestor sent to pharmacy.

## 2014-06-04 ENCOUNTER — Telehealth: Payer: Self-pay | Admitting: Family

## 2014-06-06 NOTE — Telephone Encounter (Signed)
Printed this and gave to [employer]

## 2014-07-11 ENCOUNTER — Encounter: Payer: Self-pay | Admitting: Nurse Practitioner

## 2014-07-25 ENCOUNTER — Telehealth: Payer: Self-pay | Admitting: Family

## 2014-07-25 NOTE — Telephone Encounter (Signed)
appt given for thurs with mmm

## 2014-07-28 ENCOUNTER — Ambulatory Visit: Payer: Medicaid Other | Admitting: Nurse Practitioner

## 2014-08-30 ENCOUNTER — Emergency Department (HOSPITAL_COMMUNITY)
Admission: EM | Admit: 2014-08-30 | Discharge: 2014-08-30 | Disposition: A | Discharge status: Home / Self Care | Payer: Medicaid Other | Attending: Emergency Medicine | Admitting: Emergency Medicine

## 2014-08-30 ENCOUNTER — Encounter (HOSPITAL_COMMUNITY): Payer: Self-pay | Admitting: Emergency Medicine

## 2014-08-30 DIAGNOSIS — Z8659 Personal history of other mental and behavioral disorders: Secondary | ICD-10-CM | POA: Insufficient documentation

## 2014-08-30 DIAGNOSIS — Z79899 Other long term (current) drug therapy: Secondary | ICD-10-CM | POA: Insufficient documentation

## 2014-08-30 DIAGNOSIS — J029 Acute pharyngitis, unspecified: Secondary | ICD-10-CM

## 2014-08-30 DIAGNOSIS — Z8742 Personal history of other diseases of the female genital tract: Secondary | ICD-10-CM | POA: Insufficient documentation

## 2014-08-30 DIAGNOSIS — Z880 Allergy status to penicillin: Secondary | ICD-10-CM | POA: Insufficient documentation

## 2014-08-30 DIAGNOSIS — Z720 Tobacco use: Secondary | ICD-10-CM | POA: Diagnosis not present

## 2014-08-30 DIAGNOSIS — R05 Cough: Secondary | ICD-10-CM | POA: Diagnosis present

## 2014-08-30 DIAGNOSIS — G8929 Other chronic pain: Secondary | ICD-10-CM | POA: Diagnosis not present

## 2014-08-30 DIAGNOSIS — Z8739 Personal history of other diseases of the musculoskeletal system and connective tissue: Secondary | ICD-10-CM | POA: Diagnosis not present

## 2014-08-30 MED ORDER — AZITHROMYCIN 250 MG PO TABS
ORAL_TABLET | ORAL | Status: DC
Start: 2014-08-30 — End: 2014-11-28

## 2014-08-30 NOTE — ED Notes (Signed)
Patient complaining of sore throat and cough x 5 days.

## 2014-08-30 NOTE — ED Provider Notes (Signed)
CSN: [id number]     Arrival date & time 08/30/14  1835 History   First MD Initiated Contact with Patient 08/30/14 1843     Chief Complaint  Patient presents with  . Sore Throat  . Cough     (Consider location/radiation/quality/duration/timing/severity/associated sxs/prior Treatment) Patient is a 33 y.o. female presenting with pharyngitis and cough. The history is provided by the patient.  Sore Throat This is a new problem. The problem has been gradually worsening. Associated symptoms include coughing (dry) and a sore throat.  Cough Associated symptoms: sore throat    Yesenia Gates is a 33 y.o. female who presents to the ED with a sore throat and URI. Her son is here with her with similar symptoms and is positive for strep. She states since he has been sick she has been sleeping with him and now she has a sore throat.   Past Medical History  Diagnosis Date  . Herniated disc   . Anxiety   . Ovarian cyst   . Abnormal Pap smear   . Chronic back pain    Past Surgical History  Procedure Laterality Date  . Dilation and cutterage     Family History  Problem Relation Age of Onset  . Diabetes Mother   . Cancer Mother   . Diabetes Father   . Cancer Other    History  Substance Use Topics  . Smoking status: Current Every Day Smoker -- 0.50 packs/day    Types: Cigarettes  . Smokeless tobacco: Never Used  . Alcohol Use: Yes     Comment: occasionally   OB History    Gravida Para Term Preterm AB TAB SAB Ectopic Multiple Living   2 1 1             Review of Systems  HENT: Positive for sore throat.   Respiratory: Positive for cough (dry).   all other systems negative    Allergies  Penicillins  Home Medications   Prior to Admission medications   Medication Sig Start Date End Date Taking? Authorizing Provider  rosuvastatin (CRESTOR) 20 MG tablet Take 1 tablet (20 mg total) by mouth daily. 05/31/14   Sharion Balloon, FNP  Vitamin D, Ergocalciferol, (DRISDOL) 50000  UNITS CAPS capsule Take 1 capsule (50,000 Units total) by mouth every 7 (seven) days. 05/23/14   Christy A Hawks, FNP   BP 114/72 mmHg  Pulse 90  Temp(Src) 98.6 F (37 C) (Oral)  Resp 16  Ht 5\' 6"  (1.676 m)  Wt 200 lb (90.719 kg)  BMI 32.30 kg/m2  SpO2 100%  LMP 08/25/2014 Physical Exam  Constitutional: She is oriented to person, place, and time. She appears well-developed and well-nourished. No distress.  HENT:  Head: Normocephalic.  Right Ear: Tympanic membrane normal.  Left Ear: Tympanic membrane normal.  Nose: Rhinorrhea present.  Mouth/Throat: Uvula is midline and mucous membranes are normal. Posterior oropharyngeal erythema present.  Eyes: EOM are normal.  Neck: Neck supple.  Cardiovascular: Normal rate and regular rhythm.   Pulmonary/Chest: Effort normal and breath sounds normal.  Musculoskeletal: Normal range of motion.  Lymphadenopathy:    She has no cervical adenopathy.  Neurological: She is alert and oriented to person, place, and time. No cranial nerve deficit.  Skin: Skin is warm and dry.  Psychiatric: She has a normal mood and affect. Her behavior is normal.  Nursing note and vitals reviewed.   ED Course  Procedures (including critical care time) Labs Review  MDM  33 y.o. female with  sore throat x 5 days and son with positive strep throat here today. Will treat for strep. Patient allergic to penicillin. Stable for discharge without difficulty swallowing. Discussed with the patient and all questioned fully answered. She will call return if any problems arise.    Medication List    TAKE these medications        azithromycin 250 MG tablet  Commonly known as:  ZITHROMAX  Take 2 tablets PO now and then one tablet PO daily      ASK your doctor about these medications        rosuvastatin 20 MG tablet  Commonly known as:  CRESTOR  Take 1 tablet (20 mg total) by mouth daily.     Vitamin D (Ergocalciferol) 50000 UNITS Caps capsule  Commonly known as:   DRISDOL  Take 1 capsule (50,000 Units total) by mouth every 7 (seven) days.          [address] [place], [place] 08/31/14 [place], MD 08/31/14 [phone]

## 2014-11-28 ENCOUNTER — Encounter: Payer: Self-pay | Admitting: Physician Assistant

## 2014-11-28 ENCOUNTER — Ambulatory Visit (INDEPENDENT_AMBULATORY_CARE_PROVIDER_SITE_OTHER): Payer: Medicaid Other | Admitting: Physician Assistant

## 2014-11-28 VITALS — BP 109/71 | HR 92 | Temp 97.4°F | Ht 66.0 in | Wt 210.8 lb

## 2014-11-28 DIAGNOSIS — G4452 New daily persistent headache (NDPH): Secondary | ICD-10-CM | POA: Diagnosis not present

## 2014-11-28 DIAGNOSIS — J0110 Acute frontal sinusitis, unspecified: Secondary | ICD-10-CM | POA: Diagnosis not present

## 2014-11-28 MED ORDER — AZITHROMYCIN 250 MG PO TABS: ORAL_TABLET | ORAL | Status: DC

## 2014-11-28 MED ORDER — MOMETASONE FUROATE 50 MCG/ACT NA SUSP: 2.0000 | Freq: Every day | NASAL | Status: DC

## 2014-11-28 NOTE — Progress Notes (Signed)
   Subjective:    Patient ID: Yesenia Gates, female    DOB: 1980/10/15, 34 y.o.   MRN: [id number]  Headache  Pertinent negatives include no nausea, photophobia or vomiting.   34 y/o female with comorbid herniated disk and anxiety presents with HA daily x 2 months. First episode began the morning after she had drank alcohol heavily and lack of sleep. However, other episodes have not been associated with alcohol. Occurs all times of the day lasting 1 hr or less. Different locations of pain, described as "sharp, pressure, throbbing" pain that is relieved with Tylenol. Associated with sudden movement or lying. Had a CT scan at [facility] last month and CBC , which was negative and was treated for dehydration. 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 Recently.    Review of Systems  Eyes: Negative for photophobia and visual disturbance.  Gastrointestinal: Negative for nausea, vomiting and diarrhea.  Neurological: Positive for headaches.       Objective:   Physical Exam  Constitutional: She is oriented to person, place, and time. She appears well-developed.  obese  HENT:  Head: Normocephalic and atraumatic.  Mouth/Throat: Oropharynx is clear and moist.  TTP on bilateral maxillary sinuses.  Unable to visualize TM bilaterally d/t cerumen Nasal turbinates erythematous and inflamed bilaterally   Eyes: Pupils are equal, round, and reactive to light. Right eye exhibits no discharge. Left eye exhibits no discharge.  Neck: Normal range of motion.  Cardiovascular: Normal rate, regular rhythm and normal heart sounds.  Exam reveals no gallop and no friction rub.   No murmur heard. Pulmonary/Chest: Effort normal and breath sounds normal. She has no wheezes.  Neurological: She is alert and oriented to person, place, and time. Coordination normal.  Psychiatric: She has a normal mood and affect. Her behavior is normal. Judgment and  thought content normal.  Nursing note and vitals reviewed.         Assessment & Plan:  1.Headache: Patient will f/u with eye dr to determine if lack of wearing glasses and previously diagnosed astigmatism is contributing to headaches. Adivsed patient to drink plenty of fluids and get at least 8 hrs of sleep a night 2. Acute sinusitis: Tx w/ azithromycin as directed and nasonex daily   F/U for recheck in 4 weeks.

## 2014-11-28 NOTE — Patient Instructions (Signed)
Take medication as prescribed. Go to eye dr to have eyes checked. Follow up in 4 weeks. Drink plenty of fluids. Get 8 hours of sleep at night.

## 2014-11-29 ENCOUNTER — Telehealth: Payer: Self-pay | Admitting: Nurse Practitioner

## 2014-11-29 DIAGNOSIS — E785 Hyperlipidemia, unspecified: Secondary | ICD-10-CM | POA: Insufficient documentation

## 2014-11-29 DIAGNOSIS — E559 Vitamin D deficiency, unspecified: Secondary | ICD-10-CM

## 2014-11-29 MED ORDER — VITAMIN D (ERGOCALCIFEROL) 1.25 MG (50000 UNIT) PO CAPS: 50000.0000 [IU] | ORAL_CAPSULE | ORAL | Status: DC

## 2014-11-29 MED ORDER — ROSUVASTATIN CALCIUM 20 MG PO TABS: 20.0000 mg | ORAL_TABLET | Freq: Every day | ORAL | Status: DC

## 2014-11-29 NOTE — Telephone Encounter (Signed)
Prescription sent to pharmacy.

## 2015-01-24 ENCOUNTER — Telehealth: Payer: Self-pay | Admitting: Nurse Practitioner

## 2015-01-24 NOTE — Telephone Encounter (Signed)
Appointment given for Monday at 3 with [facility]

## 2015-01-30 ENCOUNTER — Encounter: Payer: Self-pay | Admitting: Physician Assistant

## 2015-01-30 ENCOUNTER — Ambulatory Visit (INDEPENDENT_AMBULATORY_CARE_PROVIDER_SITE_OTHER): Payer: Medicaid Other

## 2015-01-30 ENCOUNTER — Ambulatory Visit (INDEPENDENT_AMBULATORY_CARE_PROVIDER_SITE_OTHER): Payer: Medicaid Other | Admitting: Physician Assistant

## 2015-01-30 VITALS — BP 117/70 | HR 99 | Temp 98.2°F | Ht 66.0 in | Wt 203.2 lb

## 2015-01-30 DIAGNOSIS — B373 Candidiasis of vulva and vagina: Secondary | ICD-10-CM

## 2015-01-30 DIAGNOSIS — B3731 Acute candidiasis of vulva and vagina: Secondary | ICD-10-CM

## 2015-01-30 DIAGNOSIS — G44211 Episodic tension-type headache, intractable: Secondary | ICD-10-CM

## 2015-01-30 DIAGNOSIS — M5441 Lumbago with sciatica, right side: Secondary | ICD-10-CM

## 2015-01-30 DIAGNOSIS — N898 Other specified noninflammatory disorders of vagina: Secondary | ICD-10-CM | POA: Diagnosis not present

## 2015-01-30 DIAGNOSIS — M542 Cervicalgia: Secondary | ICD-10-CM | POA: Diagnosis not present

## 2015-01-30 LAB — POCT WET PREP (WET MOUNT)
KOH Wet Prep POC: POSITIVE
Trichomonas Wet Prep HPF POC: NEGATIVE

## 2015-01-30 MED ORDER — FLUCONAZOLE 150 MG PO TABS: ORAL_TABLET | ORAL | Status: DC

## 2015-01-30 MED ORDER — BACLOFEN 10 MG PO TABS: ORAL_TABLET | ORAL | Status: DC

## 2015-01-30 NOTE — Progress Notes (Signed)
   Subjective:    Patient ID: Yesenia Gates, female    DOB: 1981/07/27, 34 y.o.   MRN: [id number]  HPI 33 y/o with h/o herniated disk in L4/L5 area presents for recheck on headaches. She continues to have headaches on top and right posterior daily - qod. Lasting approximately 30 minutes. No related events. She also has associated posterior neck pain x 3-4 days. She was treated for sinusitis 4 weeks ago. She is not aware of any increased stress that may contribute to the headaches. Headaches usually resolve spontaneously.   Also has c/o right shoulder pain. No known trauma.   Also has c/o vaginal discharge which is similar to past episodes of BV and yeast infections.     Review of Systems  Constitutional: Negative.   Eyes: Negative for photophobia and visual disturbance.  Musculoskeletal: Positive for arthralgias (right shoulder pain).  Neurological: Positive for headaches (sharp pain, right side mostly but left side occasionally ). Negative for dizziness, seizures, syncope, weakness, light-headedness and numbness.  Psychiatric/Behavioral: Negative.        Objective:   Physical Exam  Constitutional: She is oriented to person, place, and time. She appears well-developed and well-nourished.  Musculoskeletal: Normal range of motion. She exhibits edema (small palpable mass to right of spine. nonttp). She exhibits no tenderness.  Neurological: She is alert and oriented to person, place, and time. She has normal reflexes.  Nursing note and vitals reviewed.         Assessment & Plan:  1. Cervical pain (neck)  - DG Cervical Spine Complete - baclofen (LIORESAL) 10 MG tablet; Take one tablet q 8 hrs prn for muscle spasm  Dispense: 90 each; Refill: 0 - Ambulatory referral to Orthopedic Surgery  2. Intractable episodic tension-type headache  - DG Cervical Spine Complete  3. Vaginal discharge  - POCT Wet Prep [facility])  4. Vulvovaginal candidiasis  - fluconazole (DIFLUCAN)  150 MG tablet; 1 tablet PO on day 1. Repeat in 3 days  Dispense: 2 tablet; Refill: 0  5. Bilateral low back pain with right-sided sciatica  - Ambulatory referral to Orthopedic Surgery   Continue all meds Labs pending Health Maintenance reviewed Diet and exercise encouraged RTO prn   Carlita Whitcomb A. Benjamin Stain PA-C

## 2015-03-20 ENCOUNTER — Telehealth: Payer: Self-pay | Admitting: Nurse Practitioner

## 2015-03-21 NOTE — Telephone Encounter (Signed)
Provider called Rx to pharmacy.

## 2015-03-21 NOTE — Telephone Encounter (Signed)
Pt aware Rx called to pharmacy

## 2015-03-21 NOTE — Telephone Encounter (Signed)
Prescription sent to pharmacy  Joedy Eickhoff A. Deantre Bourdon PA-C   

## 2015-05-22 ENCOUNTER — Ambulatory Visit (INDEPENDENT_AMBULATORY_CARE_PROVIDER_SITE_OTHER): Payer: Medicaid Other | Admitting: Family

## 2015-05-22 ENCOUNTER — Encounter: Payer: Self-pay | Admitting: Family

## 2015-05-22 VITALS — BP 113/77 | HR 89 | Temp 97.3°F | Ht 66.0 in | Wt 209.2 lb

## 2015-05-22 DIAGNOSIS — Z30013 Encounter for initial prescription of injectable contraceptive: Secondary | ICD-10-CM

## 2015-05-22 LAB — POCT URINE PREGNANCY: PREG TEST UR: NEGATIVE

## 2015-05-22 MED ORDER — MEDROXYPROGESTERONE ACETATE 150 MG/ML IM SUSP
150.0000 mg | INTRAMUSCULAR | Status: DC
  Administered 2015-05-22 – 2015-08-18 (×2): 150 mg via INTRAMUSCULAR

## 2015-05-22 MED ORDER — MEDROXYPROGESTERONE ACETATE 150 MG/ML IM SUSP: 150.0000 mg | Freq: Once | INTRAMUSCULAR | Status: DC

## 2015-05-22 NOTE — Progress Notes (Signed)
   Subjective:    Patient ID: Yesenia Gates, female    DOB: 1981/01/25, 34 y.o.   MRN: [id number]   HPI Pt presents to the office today requesting the Depo Shot. PT states she has been on the shot before with no complications. Pt states she smokes about a half a pack a day.    Review of Systems  Constitutional: Negative.   HENT: Negative.   Eyes: Negative.   Respiratory: Negative.  Negative for shortness of breath.   Cardiovascular: Negative.  Negative for palpitations.  Gastrointestinal: Negative.   Endocrine: Negative.   Genitourinary: Negative.   Musculoskeletal: Negative.   Neurological: Negative.  Negative for headaches.  Hematological: Negative.   Psychiatric/Behavioral: Negative.   All other systems reviewed and are negative.      Objective:   Physical Exam  Constitutional: She is oriented to person, place, and time. She appears well-developed and well-nourished. No distress.  HENT:  Head: Normocephalic and atraumatic.  Right Ear: External ear normal.  Left Ear: External ear normal.  Nose: Nose normal.  Mouth/Throat: Oropharynx is clear and moist.  Eyes: Pupils are equal, round, and reactive to light.  Neck: Normal range of motion. Neck supple. No thyromegaly present.  Cardiovascular: Normal rate, regular rhythm, normal heart sounds and intact distal pulses.   No murmur heard. Pulmonary/Chest: Effort normal and breath sounds normal. No respiratory distress. She has no wheezes.  Abdominal: Soft. Bowel sounds are normal. She exhibits no distension. There is no tenderness.  Musculoskeletal: Normal range of motion. She exhibits no edema or tenderness.  Neurological: She is alert and oriented to person, place, and time. She has normal reflexes. No cranial nerve deficit.  Skin: Skin is warm and dry.  Psychiatric: She has a normal mood and affect. Her behavior is normal. Judgment and thought content normal.  Vitals reviewed.     BP 113/77 mmHg  Pulse 89   Temp(Src) 97.3 F (36.3 C) (Oral)  Ht 5\' 6"  (1.676 m)  Wt 209 lb 3.2 oz (94.892 kg)  BMI 33.78 kg/m2     Assessment & Plan:  1. Encounter for initial prescription of injectable contraceptive -Safe sex discussed -Smoking cessation discussed - POCT urine pregnancy - medroxyPROGESTERone (DEPO-PROVERA) 150 MG/ML injection; Inject 1 mL (150 mg total) into the muscle once.  Dispense: 1 mL; Refill: 11   Continue all meds Health Maintenance reviewed Diet and exercise encouraged RTO 1 year  Evelina Dun, FNP

## 2015-05-22 NOTE — Patient Instructions (Addendum)
Health Maintenance Adopting a healthy lifestyle and getting preventive care can go a long way to promote health and wellness. Talk with your health care provider about what schedule of regular examinations is right for you. This is a good chance for you to check in with your provider about disease prevention and staying healthy. In between checkups, there are plenty of things you can do on your own. Experts have done a lot of research about which lifestyle changes and preventive measures are most likely to keep you healthy. Ask your health care provider for more information. WEIGHT AND DIET  Eat a healthy diet  Be sure to include plenty of vegetables, fruits, low-fat dairy products, and lean protein.  Do not eat a lot of foods high in solid fats, added sugars, or salt.  Get regular exercise. This is one of the most important things you can do for your health.  Most adults should exercise for at least 150 minutes each week. The exercise should increase your heart rate and make you sweat (moderate-intensity exercise).  Most adults should also do strengthening exercises at least twice a week. This is in addition to the moderate-intensity exercise.  Maintain a healthy weight  Body mass index (BMI) is a measurement that can be used to identify possible weight problems. It estimates body fat based on height and weight. Your health care provider can help determine your BMI and help you achieve or maintain a healthy weight.  For females 25 years of age and older:   A BMI below 18.5 is considered underweight.  A BMI of 18.5 to 24.9 is normal.  A BMI of 25 to 29.9 is considered overweight.  A BMI of 30 and above is considered obese.  Watch levels of cholesterol and blood lipids  You should start having your blood tested for lipids and cholesterol at 36 years of age, then have this test every 5 years.  You may need to have your cholesterol levels checked more often if:  Your lipid or  cholesterol levels are high.  You are older than 36 years of age.  You are at high risk for heart disease.  CANCER SCREENING   Lung Cancer  Lung cancer screening is recommended for adults 97-92 years old who are at high risk for lung cancer because of a history of smoking.  A yearly low-dose CT scan of the lungs is recommended for people who:  Currently smoke.  Have quit within the past 15 years.  Have at least a 30-pack-year history of smoking. A pack year is smoking an average of one pack of cigarettes a day for 1 year.  Yearly screening should continue until it has been 15 years since you quit.  Yearly screening should stop if you develop a health problem that would prevent you from having lung cancer treatment.  Breast Cancer  Practice breast self-awareness. This means understanding how your breasts normally appear and feel.  It also means doing regular breast self-exams. Let your health care provider know about any changes, no matter how small.  If you are in your 20s or 30s, you should have a clinical breast exam (CBE) by a health care provider every 1-3 years as part of a regular health exam.  If you are 76 or older, have a CBE every year. Also consider having a breast X-ray (mammogram) every year.  If you have a family history of breast cancer, talk to your health care provider about genetic screening.  If you are  at high risk for breast cancer, talk to your health care provider about having an MRI and a mammogram every year.  Breast cancer gene (BRCA) assessment is recommended for women who have family members with BRCA-related cancers. BRCA-related cancers include:  Breast.  Ovarian.  Tubal.  Peritoneal cancers.  Results of the assessment will determine the need for genetic counseling and BRCA1 and BRCA2 testing. Cervical Cancer Routine pelvic examinations to screen for cervical cancer are no longer recommended for nonpregnant women who are considered low  risk for cancer of the pelvic organs (ovaries, uterus, and vagina) and who do not have symptoms. A pelvic examination may be necessary if you have symptoms including those associated with pelvic infections. Ask your health care provider if a screening pelvic exam is right for you.   The Pap test is the screening test for cervical cancer for women who are considered at risk.  If you had a hysterectomy for a problem that was not cancer or a condition that could lead to cancer, then you no longer need Pap tests.  If you are older than 65 years, and you have had normal Pap tests for the past 10 years, you no longer need to have Pap tests.  If you have had past treatment for cervical cancer or a condition that could lead to cancer, you need Pap tests and screening for cancer for at least 20 years after your treatment.  If you no longer get a Pap test, assess your risk factors if they change (such as having a new sexual partner). This can affect whether you should start being screened again.  Some women have medical problems that increase their chance of getting cervical cancer. If this is the case for you, your health care provider may recommend more frequent screening and Pap tests.  The human papillomavirus (HPV) test is another test that may be used for cervical cancer screening. The HPV test looks for the virus that can cause cell changes in the cervix. The cells collected during the Pap test can be tested for HPV.  The HPV test can be used to screen women 30 years of age and older. Getting tested for HPV can extend the interval between normal Pap tests from three to five years.  An HPV test also should be used to screen women of any age who have unclear Pap test results.  After 36 years of age, women should have HPV testing as often as Pap tests.  Colorectal Cancer  This type of cancer can be detected and often prevented.  Routine colorectal cancer screening usually begins at 36 years of  age and continues through 36 years of age.  Your health care provider may recommend screening at an earlier age if you have risk factors for colon cancer.  Your health care provider may also recommend using home test kits to check for hidden blood in the stool.  A small camera at the end of a tube can be used to examine your colon directly (sigmoidoscopy or colonoscopy). This is done to check for the earliest forms of colorectal cancer.  Routine screening usually begins at age 50.  Direct examination of the colon should be repeated every 5-10 years through 36 years of age. However, you may need to be screened more often if early forms of precancerous polyps or small growths are found. Skin Cancer  Check your skin from head to toe regularly.  Tell your health care provider about any new moles or changes in   moles, especially if there is a change in a mole's shape or color.  Also tell your health care provider if you have a mole that is larger than the size of a pencil eraser.  Always use sunscreen. Apply sunscreen liberally and repeatedly throughout the day.  Protect yourself by wearing long sleeves, pants, a wide-brimmed hat, and sunglasses whenever you are outside. HEART DISEASE, DIABETES, AND HIGH BLOOD PRESSURE   Have your blood pressure checked at least every 1-2 years. High blood pressure causes heart disease and increases the risk of stroke.  If you are between 75 years and 42 years old, ask your health care provider if you should take aspirin to prevent strokes.  Have regular diabetes screenings. This involves taking a blood sample to check your fasting blood sugar level.  If you are at a normal weight and have a low risk for diabetes, have this test once every three years after 36 years of age.  If you are overweight and have a high risk for diabetes, consider being tested at a younger age or more often. PREVENTING INFECTION  Hepatitis B  If you have a higher risk for  hepatitis B, you should be screened for this virus. You are considered at high risk for hepatitis B if:  You were born in a country where hepatitis B is common. Ask your health care provider which countries are considered high risk.  Your parents were born in a high-risk country, and you have not been immunized against hepatitis B (hepatitis B vaccine).  You have HIV or AIDS.  You use needles to inject street drugs.  You live with someone who has hepatitis B.  You have had sex with someone who has hepatitis B.  You get hemodialysis treatment.  You take certain medicines for conditions, including cancer, organ transplantation, and autoimmune conditions. Hepatitis C  Blood testing is recommended for:  Everyone born from [birth year] through 1967.  Anyone with known risk factors for hepatitis C. Sexually transmitted infections (STIs)  You should be screened for sexually transmitted infections (STIs) including gonorrhea and chlamydia if:  You are sexually active and are younger than 36 years of age.  You are older than 36 years of age and your health care provider tells you that you are at risk for this type of infection.  Your sexual activity has changed since you were last screened and you are at an increased risk for chlamydia or gonorrhea. Ask your health care provider if you are at risk.  If you do not have HIV, but are at risk, it may be recommended that you take a prescription medicine daily to prevent HIV infection. This is called pre-exposure prophylaxis (PrEP). You are considered at risk if:  You are sexually active and do not regularly use condoms or know the HIV status of your partner(s).  You take drugs by injection.  You are sexually active with a partner who has HIV. Talk with your health care provider about whether you are at high risk of being infected with HIV. If you choose to begin PrEP, you should first be tested for HIV. You should then be tested every 3 months for  as long as you are taking PrEP.  PREGNANCY   If you are premenopausal and you may become pregnant, ask your health care provider about preconception counseling.  If you may become pregnant, take 400 to 800 micrograms (mcg) of folic acid every day.  If you want to prevent pregnancy, talk to your  health care provider about birth control (contraception). OSTEOPOROSIS AND MENOPAUSE   Osteoporosis is a disease in which the bones lose minerals and strength with aging. This can result in serious bone fractures. Your risk for osteoporosis can be identified using a bone density scan.  If you are 71 years of age or older, or if you are at risk for osteoporosis and fractures, ask your health care provider if you should be screened.  Ask your health care provider whether you should take a calcium or vitamin D supplement to lower your risk for osteoporosis.  Menopause may have certain physical symptoms and risks.  Hormone replacement therapy may reduce some of these symptoms and risks. Talk to your health care provider about whether hormone replacement therapy is right for you.  HOME CARE INSTRUCTIONS   Schedule regular health, dental, and eye exams.  Stay current with your immunizations.   Do not use any tobacco products including cigarettes, chewing tobacco, or electronic cigarettes.  If you are pregnant, do not drink alcohol.  If you are breastfeeding, limit how much and how often you drink alcohol.  Limit alcohol intake to no more than 1 drink per day for nonpregnant women. One drink equals 12 ounces of beer, 5 ounces of wine, or 1 ounces of hard liquor.  Do not use street drugs.  Do not share needles.  Ask your health care provider for help if you need support or information about quitting drugs.  Tell your health care provider if you often feel depressed.  Tell your health care provider if you have ever been abused or do not feel safe at home. Document Released: 03/11/2011  Document Revised: 01/10/2014 Document Reviewed: 07/28/2013 [facility] Patient Information 2015 [place], [place]. This information is not intended to replace advice given to you by your health care provider. Make sure you discuss any questions you have with your health care provider. Medroxyprogesterone injection [Contraceptive] What is this medicine? MEDROXYPROGESTERONE (me DROX ee proe JES te rone) contraceptive injections prevent pregnancy. They provide effective birth control for 3 months. Depo-subQ Provera 104 is also used for treating pain related to endometriosis. This medicine may be used for other purposes; ask your health care provider or pharmacist if you have questions. COMMON BRAND NAME(S): Depo-Provera, Depo-subQ Provera 104 What should I tell my health care provider before I take this medicine? They need to know if you have any of these conditions: -frequently drink alcohol -asthma -blood vessel disease or a history of a blood clot in the lungs or legs -bone disease such as osteoporosis -breast cancer -diabetes -eating disorder (anorexia nervosa or bulimia) -high blood pressure -HIV infection or AIDS -kidney disease -liver disease -mental depression -migraine -seizures (convulsions) -stroke -tobacco smoker -vaginal bleeding -an unusual or allergic reaction to medroxyprogesterone, other hormones, medicines, foods, dyes, or preservatives -pregnant or trying to get pregnant -breast-feeding How should I use this medicine? Depo-Provera Contraceptive injection is given into a muscle. Depo-subQ Provera 104 injection is given under the skin. These injections are given by a health care professional. You must not be pregnant before getting an injection. The injection is usually given during the first 5 days after the start of a menstrual period or 6 weeks after delivery of a baby. Talk to your pediatrician regarding the use of this medicine in children. Special care may be needed.  These injections have been used in female children who have started having menstrual periods. Overdosage: If you think you have taken too much of this medicine contact  a poison control center or emergency room at once. NOTE: This medicine is only for you. Do not share this medicine with others. What if I miss a dose? Try not to miss a dose. You must get an injection once every 3 months to maintain birth control. If you cannot keep an appointment, call and reschedule it. If you wait longer than 13 weeks between Depo-Provera contraceptive injections or longer than 14 weeks between Depo-subQ Provera 104 injections, you could get pregnant. Use another method for birth control if you miss your appointment. You may also need a pregnancy test before receiving another injection. What may interact with this medicine? Do not take this medicine with any of the following medications: -bosentan This medicine may also interact with the following medications: -aminoglutethimide -antibiotics or medicines for infections, especially rifampin, rifabutin, rifapentine, and griseofulvin -aprepitant -barbiturate medicines such as phenobarbital or primidone -bexarotene -carbamazepine -medicines for seizures like ethotoin, felbamate, oxcarbazepine, phenytoin, topiramate -modafinil -St. John's wort This list may not describe all possible interactions. Give your health care provider a list of all the medicines, herbs, non-prescription drugs, or dietary supplements you use. Also tell them if you smoke, drink alcohol, or use illegal drugs. Some items may interact with your medicine. What should I watch for while using this medicine? This drug does not protect you against HIV infection (AIDS) or other sexually transmitted diseases. Use of this product may cause you to lose calcium from your bones. Loss of calcium may cause weak bones (osteoporosis). Only use this product for more than 2 years if other forms of birth control  are not right for you. The longer you use this product for birth control the more likely you will be at risk for weak bones. Ask your health care professional how you can keep strong bones. You may have a change in bleeding pattern or irregular periods. Many females stop having periods while taking this drug. If you have received your injections on time, your chance of being pregnant is very low. If you think you may be pregnant, see your health care professional as soon as possible. Tell your health care professional if you want to get pregnant within the next year. The effect of this medicine may last a long time after you get your last injection. What side effects may I notice from receiving this medicine? Side effects that you should report to your doctor or health care professional as soon as possible: -allergic reactions like skin rash, itching or hives, swelling of the face, lips, or tongue -breast tenderness or discharge -breathing problems -changes in vision -depression -feeling faint or lightheaded, falls -fever -pain in the abdomen, chest, groin, or leg -problems with balance, talking, walking -unusually weak or tired -yellowing of the eyes or skin Side effects that usually do not require medical attention (report to your doctor or health care professional if they continue or are bothersome): -acne -fluid retention and swelling -headache -irregular periods, spotting, or absent periods -temporary pain, itching, or skin reaction at site where injected -weight gain This list may not describe all possible side effects. Call your doctor for medical advice about side effects. You may report side effects to FDA at 1-800-FDA-1088. Where should I keep my medicine? This does not apply. The injection will be given to you by a health care professional. NOTE: This sheet is a summary. It may not cover all possible information. If you have questions about this medicine, talk to your doctor,  pharmacist, or health care provider.  2015, [employer]. (2008-09-16 18:37:56)

## 2015-05-22 NOTE — Addendum Note (Signed)
Addended by: Nigel Berthold C on: 05/22/2015 05:02 PM   Modules accepted: Orders

## 2015-06-09 ENCOUNTER — Telehealth: Payer: Self-pay | Admitting: Nurse Practitioner

## 2015-06-09 DIAGNOSIS — B379 Candidiasis, unspecified: Secondary | ICD-10-CM

## 2015-06-09 MED ORDER — FLUCONAZOLE 150 MG PO TABS: 150.0000 mg | ORAL_TABLET | Freq: Once | ORAL | Status: DC

## 2015-06-09 NOTE — Telephone Encounter (Signed)
Rx sent to [facility] in [facility]

## 2015-06-12 NOTE — Telephone Encounter (Signed)
Due to two incorrect phone numbers, TC made to [facility] to confirm if pt picked up RX over weekend. She picked up on Friday night. [facility] gave phone number of [phone].

## 2015-07-11 ENCOUNTER — Telehealth: Payer: Self-pay | Admitting: Family

## 2015-07-11 NOTE — Telephone Encounter (Signed)
Not on med list

## 2015-07-13 ENCOUNTER — Telehealth: Payer: Self-pay

## 2015-07-13 MED ORDER — VITAMIN D (ERGOCALCIFEROL) 1.25 MG (50000 UNIT) PO CAPS: 50000.0000 [IU] | ORAL_CAPSULE | ORAL | Status: DC

## 2015-07-13 MED ORDER — ATORVASTATIN CALCIUM 40 MG PO TABS: 40.0000 mg | ORAL_TABLET | Freq: Every day | ORAL | Status: DC

## 2015-07-13 MED ORDER — ROSUVASTATIN CALCIUM 20 MG PO TABS: 20.0000 mg | ORAL_TABLET | Freq: Every day | ORAL | Status: DC

## 2015-07-13 NOTE — Telephone Encounter (Signed)
Insurance denies Crestor. Lipitor Prescription sent to pharmacy

## 2015-07-13 NOTE — Telephone Encounter (Signed)
Pt needs follow up appt

## 2015-07-13 NOTE — Telephone Encounter (Signed)
Medicaid non preferred Crestor  Preferred are : Atorvastatin, lovastatin, pravastatin and simvastatin

## 2015-07-13 NOTE — Telephone Encounter (Signed)
Pt aware.

## 2015-07-14 ENCOUNTER — Ambulatory Visit: Payer: Medicaid Other | Admitting: Family

## 2015-07-14 ENCOUNTER — Telehealth: Payer: Self-pay | Admitting: Family

## 2015-07-17 ENCOUNTER — Ambulatory Visit (INDEPENDENT_AMBULATORY_CARE_PROVIDER_SITE_OTHER): Payer: Medicaid Other | Admitting: Family

## 2015-07-17 ENCOUNTER — Encounter: Payer: Self-pay | Admitting: Family

## 2015-07-17 VITALS — BP 102/68 | HR 93 | Temp 98.6°F | Ht 66.0 in | Wt 200.0 lb

## 2015-07-17 DIAGNOSIS — Z23 Encounter for immunization: Secondary | ICD-10-CM

## 2015-07-17 DIAGNOSIS — E785 Hyperlipidemia, unspecified: Secondary | ICD-10-CM

## 2015-07-17 DIAGNOSIS — Z720 Tobacco use: Secondary | ICD-10-CM

## 2015-07-17 DIAGNOSIS — J309 Allergic rhinitis, unspecified: Secondary | ICD-10-CM

## 2015-07-17 DIAGNOSIS — F17200 Nicotine dependence, unspecified, uncomplicated: Secondary | ICD-10-CM | POA: Insufficient documentation

## 2015-07-17 DIAGNOSIS — E559 Vitamin D deficiency, unspecified: Secondary | ICD-10-CM

## 2015-07-17 MED ORDER — FLUTICASONE PROPIONATE 50 MCG/ACT NA SUSP
2.0000 | Freq: Every day | NASAL | Status: DC
Start: 2015-07-17 — End: 2015-11-17

## 2015-07-17 MED ORDER — ATORVASTATIN CALCIUM 40 MG PO TABS: 40.0000 mg | ORAL_TABLET | Freq: Every day | ORAL | Status: DC

## 2015-07-17 MED ORDER — BENZONATATE 200 MG PO CAPS: 200.0000 mg | ORAL_CAPSULE | Freq: Three times a day (TID) | ORAL | Status: DC | PRN

## 2015-07-17 NOTE — Telephone Encounter (Signed)
Patient was seen 07-17-15 with Yesenia Gates.

## 2015-07-17 NOTE — Patient Instructions (Signed)
Health Maintenance, Female Adopting a healthy lifestyle and getting preventive care can go a long way to promote health and wellness. Talk with your health care provider about what schedule of regular examinations is right for you. This is a good chance for you to check in with your provider about disease prevention and staying healthy. In between checkups, there are plenty of things you can do on your own. Experts have done a lot of research about which lifestyle changes and preventive measures are most likely to keep you healthy. Ask your health care provider for more information. WEIGHT AND DIET  Eat a healthy diet  Be sure to include plenty of vegetables, fruits, low-fat dairy products, and lean protein.  Do not eat a lot of foods high in solid fats, added sugars, or salt.  Get regular exercise. This is one of the most important things you can do for your health.  Most adults should exercise for at least 150 minutes each week. The exercise should increase your heart rate and make you sweat (moderate-intensity exercise).  Most adults should also do strengthening exercises at least twice a week. This is in addition to the moderate-intensity exercise.  Maintain a healthy weight  Body mass index (BMI) is a measurement that can be used to identify possible weight problems. It estimates body fat based on height and weight. Your health care provider can help determine your BMI and help you achieve or maintain a healthy weight.  For females 20 years of age and older:   A BMI below 18.5 is considered underweight.  A BMI of 18.5 to 24.9 is normal.  A BMI of 25 to 29.9 is considered overweight.  A BMI of 30 and above is considered obese.  Watch levels of cholesterol and blood lipids  You should start having your blood tested for lipids and cholesterol at 36 years of age, then have this test every 5 years.  You may need to have your cholesterol levels checked more often if:  Your lipid  or cholesterol levels are high.  You are older than 36 years of age.  You are at high risk for heart disease.  CANCER SCREENING   Lung Cancer  Lung cancer screening is recommended for adults 55-80 years old who are at high risk for lung cancer because of a history of smoking.  A yearly low-dose CT scan of the lungs is recommended for people who:  Currently smoke.  Have quit within the past 15 years.  Have at least a 30-pack-year history of smoking. A pack year is smoking an average of one pack of cigarettes a day for 1 year.  Yearly screening should continue until it has been 15 years since you quit.  Yearly screening should stop if you develop a health problem that would prevent you from having lung cancer treatment.  Breast Cancer  Practice breast self-awareness. This means understanding how your breasts normally appear and feel.  It also means doing regular breast self-exams. Let your health care provider know about any changes, no matter how small.  If you are in your 20s or 30s, you should have a clinical breast exam (CBE) by a health care provider every 1-3 years as part of a regular health exam.  If you are 40 or older, have a CBE every year. Also consider having a breast X-ray (mammogram) every year.  If you have a family history of breast cancer, talk to your health care provider about genetic screening.  If you   are at high risk for breast cancer, talk to your health care provider about having an MRI and a mammogram every year.  Breast cancer gene (BRCA) assessment is recommended for women who have family members with BRCA-related cancers. BRCA-related cancers include:  Breast.  Ovarian.  Tubal.  Peritoneal cancers.  Results of the assessment will determine the need for genetic counseling and BRCA1 and BRCA2 testing. Cervical Cancer Your health care provider may recommend that you be screened regularly for cancer of the pelvic organs (ovaries, uterus, and  vagina). This screening involves a pelvic examination, including checking for microscopic changes to the surface of your cervix (Pap test). You may be encouraged to have this screening done every 3 years, beginning at age 21.  For women ages 30-65, health care providers may recommend pelvic exams and Pap testing every 3 years, or they may recommend the Pap and pelvic exam, combined with testing for human papilloma virus (HPV), every 5 years. Some types of HPV increase your risk of cervical cancer. Testing for HPV may also be done on women of any age with unclear Pap test results.  Other health care providers may not recommend any screening for nonpregnant women who are considered low risk for pelvic cancer and who do not have symptoms. Ask your health care provider if a screening pelvic exam is right for you.  If you have had past treatment for cervical cancer or a condition that could lead to cancer, you need Pap tests and screening for cancer for at least 20 years after your treatment. If Pap tests have been discontinued, your risk factors (such as having a new sexual partner) need to be reassessed to determine if screening should resume. Some women have medical problems that increase the chance of getting cervical cancer. In these cases, your health care provider may recommend more frequent screening and Pap tests. Colorectal Cancer  This type of cancer can be detected and often prevented.  Routine colorectal cancer screening usually begins at 36 years of age and continues through 36 years of age.  Your health care provider may recommend screening at an earlier age if you have risk factors for colon cancer.  Your health care provider may also recommend using home test kits to check for hidden blood in the stool.  A small camera at the end of a tube can be used to examine your colon directly (sigmoidoscopy or colonoscopy). This is done to check for the earliest forms of colorectal  cancer.  Routine screening usually begins at age 50.  Direct examination of the colon should be repeated every 5-10 years through 36 years of age. However, you may need to be screened more often if early forms of precancerous polyps or small growths are found. Skin Cancer  Check your skin from head to toe regularly.  Tell your health care provider about any new moles or changes in moles, especially if there is a change in a mole's shape or color.  Also tell your health care provider if you have a mole that is larger than the size of a pencil eraser.  Always use sunscreen. Apply sunscreen liberally and repeatedly throughout the day.  Protect yourself by wearing long sleeves, pants, a wide-brimmed hat, and sunglasses whenever you are outside. HEART DISEASE, DIABETES, AND HIGH BLOOD PRESSURE   High blood pressure causes heart disease and increases the risk of stroke. High blood pressure is more likely to develop in:  People who have blood pressure in the high end   of the normal range (130-139/85-89 mm Hg).  People who are overweight or obese.  People who are African American.  If you are 38-23 years of age, have your blood pressure checked every 3-5 years. If you are 61 years of age or older, have your blood pressure checked every year. You should have your blood pressure measured twice--once when you are at a hospital or clinic, and once when you are not at a hospital or clinic. Record the average of the two measurements. To check your blood pressure when you are not at a hospital or clinic, you can use:  An automated blood pressure machine at a pharmacy.  A home blood pressure monitor.  If you are between 45 years and 39 years old, ask your health care provider if you should take aspirin to prevent strokes.  Have regular diabetes screenings. This involves taking a blood sample to check your fasting blood sugar level.  If you are at a normal weight and have a low risk for diabetes,  have this test once every three years after 36 years of age.  If you are overweight and have a high risk for diabetes, consider being tested at a younger age or more often. PREVENTING INFECTION  Hepatitis B  If you have a higher risk for hepatitis B, you should be screened for this virus. You are considered at high risk for hepatitis B if:  You were born in a country where hepatitis B is common. Ask your health care provider which countries are considered high risk.  Your parents were born in a high-risk country, and you have not been immunized against hepatitis B (hepatitis B vaccine).  You have HIV or AIDS.  You use needles to inject street drugs.  You live with someone who has hepatitis B.  You have had sex with someone who has hepatitis B.  You get hemodialysis treatment.  You take certain medicines for conditions, including cancer, organ transplantation, and autoimmune conditions. Hepatitis C  Blood testing is recommended for:  Everyone born from [birth year] through 1967.  Anyone with known risk factors for hepatitis C. Sexually transmitted infections (STIs)  You should be screened for sexually transmitted infections (STIs) including gonorrhea and chlamydia if:  You are sexually active and are younger than 36 years of age.  You are older than 36 years of age and your health care provider tells you that you are at risk for this type of infection.  Your sexual activity has changed since you were last screened and you are at an increased risk for chlamydia or gonorrhea. Ask your health care provider if you are at risk.  If you do not have HIV, but are at risk, it may be recommended that you take a prescription medicine daily to prevent HIV infection. This is called pre-exposure prophylaxis (PrEP). You are considered at risk if:  You are sexually active and do not regularly use condoms or know the HIV status of your partner(s).  You take drugs by injection.  You are sexually  active with a partner who has HIV. Talk with your health care provider about whether you are at high risk of being infected with HIV. If you choose to begin PrEP, you should first be tested for HIV. You should then be tested every 3 months for as long as you are taking PrEP.  PREGNANCY   If you are premenopausal and you may become pregnant, ask your health care provider about preconception counseling.  If you may  become pregnant, take 400 to 800 micrograms (mcg) of folic acid every day.  If you want to prevent pregnancy, talk to your health care provider about birth control (contraception). OSTEOPOROSIS AND MENOPAUSE   Osteoporosis is a disease in which the bones lose minerals and strength with aging. This can result in serious bone fractures. Your risk for osteoporosis can be identified using a bone density scan.  If you are 61 years of age or older, or if you are at risk for osteoporosis and fractures, ask your health care provider if you should be screened.  Ask your health care provider whether you should take a calcium or vitamin D supplement to lower your risk for osteoporosis.  Menopause may have certain physical symptoms and risks.  Hormone replacement therapy may reduce some of these symptoms and risks. Talk to your health care provider about whether hormone replacement therapy is right for you.  HOME CARE INSTRUCTIONS   Schedule regular health, dental, and eye exams.  Stay current with your immunizations.   Do not use any tobacco products including cigarettes, chewing tobacco, or electronic cigarettes.  If you are pregnant, do not drink alcohol.  If you are breastfeeding, limit how much and how often you drink alcohol.  Limit alcohol intake to no more than 1 drink per day for nonpregnant women. One drink equals 12 ounces of beer, 5 ounces of wine, or 1 ounces of hard liquor.  Do not use street drugs.  Do not share needles.  Ask your health care provider for help if  you need support or information about quitting drugs.  Tell your health care provider if you often feel depressed.  Tell your health care provider if you have ever been abused or do not feel safe at home.   This information is not intended to replace advice given to you by your health care provider. Make sure you discuss any questions you have with your health care provider.   Document Released: 03/11/2011 Document Revised: 09/16/2014 Document Reviewed: 07/28/2013 Elsevier Interactive Patient Education [employer].

## 2015-07-17 NOTE — Progress Notes (Signed)
Subjective:    Patient ID: Yesenia Gates, female    DOB: December 10, 1980, 34 y.o.   MRN: [id number]  Pt presents to the office today for chronic follow up. Pt states she has not taken her cholesterol medication (Crester) in the last several months. Pt states she felt like she was having memory problems while taking the Crestor so she stopped it. Pt states she wants to restart cholesterol medication today because she "worries about her cholesterol".  Hyperlipidemia This is a chronic problem. The current episode started more than 1 year ago. The problem is uncontrolled. Recent lipid tests were reviewed and are high. She has no history of diabetes. Factors aggravating her hyperlipidemia include smoking. Pertinent negatives include no shortness of breath. Current antihyperlipidemic treatment includes diet change. The current treatment provides mild improvement of lipids. Risk factors for coronary artery disease include dyslipidemia, family history, a sedentary lifestyle and stress.  Cough This is a new problem. The current episode started 1 to 4 weeks ago. The problem has been waxing and waning. The problem occurs every few minutes. The cough is non-productive. Associated symptoms include postnasal drip and rhinorrhea. Pertinent negatives include no chills, ear congestion, headaches or shortness of breath. The symptoms are aggravated by lying down. Risk factors for lung disease include smoking/tobacco exposure. She has tried rest for the symptoms. The treatment provided mild relief. There is no history of asthma.      Review of Systems  Constitutional: Negative.  Negative for chills.  HENT: Positive for postnasal drip and rhinorrhea.   Eyes: Negative.   Respiratory: Positive for cough. Negative for shortness of breath.   Cardiovascular: Negative.  Negative for palpitations.  Gastrointestinal: Negative.   Endocrine: Negative.   Genitourinary: Negative.   Musculoskeletal: Negative.     Neurological: Negative.  Negative for headaches.  Hematological: Negative.   Psychiatric/Behavioral: Negative.   All other systems reviewed and are negative.      Objective:   Physical Exam  Constitutional: She is oriented to person, place, and time. She appears well-developed and well-nourished. No distress.  HENT:  Head: Normocephalic and atraumatic.  Right Ear: External ear normal.  Left Ear: External ear normal.  Nose: Nose normal.  Mouth/Throat: Oropharynx is clear and moist.  Eyes: Pupils are equal, round, and reactive to light.  Neck: Normal range of motion. Neck supple. No thyromegaly present.  Cardiovascular: Normal rate, regular rhythm, normal heart sounds and intact distal pulses.   No murmur heard. Pulmonary/Chest: Effort normal and breath sounds normal. No respiratory distress. She has no wheezes.  Abdominal: Soft. Bowel sounds are normal. She exhibits no distension. There is no tenderness.  Musculoskeletal: Normal range of motion. She exhibits no edema or tenderness.  Neurological: She is alert and oriented to person, place, and time. She has normal reflexes. No cranial nerve deficit.  Skin: Skin is warm and dry.  Psychiatric: She has a normal mood and affect. Her behavior is normal. Judgment and thought content normal.  Vitals reviewed.   BP 102/68 mmHg  Pulse 93  Temp(Src) 98.6 F (37 C) (Oral)  Ht $R'5\' 6"'qD$  (1.676 m)  Wt 200 lb (90.719 kg)  BMI 32.30 kg/m2  LMP 07/17/2015       Assessment & Plan:  1. Vitamin D deficiency - CMP14+EGFR - Vit D  25 hydroxy (rtn osteoporosis monitoring)  2. Hyperlipidemia -Pt started on Lipitor today - CMP14+EGFR - Lipid panel - atorvastatin (LIPITOR) 40 MG tablet; Take 1 tablet (40 mg total) by mouth  daily.  Dispense: 90 tablet; Refill: 0  3. Allergic rhinitis, unspecified allergic rhinitis type - fluticasone (FLONASE) 50 MCG/ACT nasal spray; Place 2 sprays into both nostrils daily.  Dispense: 16 g; Refill: 6 -  benzonatate (TESSALON) 200 MG capsule; Take 1 capsule (200 mg total) by mouth 3 (three) times daily as needed.  Dispense: 30 capsule; Refill: 1   4. Current smoker -Smoking cessation discussed  Continue all meds Labs pending Health Maintenance reviewed Diet and exercise encouraged RTO 6 months  Evelina Dun, FNP

## 2015-07-18 ENCOUNTER — Other Ambulatory Visit: Payer: Self-pay | Admitting: Family

## 2015-07-18 LAB — VITAMIN D 25 HYDROXY (VIT D DEFICIENCY, FRACTURES): VIT D 25 HYDROXY: 26.9 ng/mL — AB (ref 30.0–100.0)

## 2015-07-18 LAB — CMP14+EGFR
A/G RATIO: 2 (ref 1.1–2.5)
ALBUMIN: 4.5 g/dL (ref 3.5–5.5)
ALK PHOS: 87 IU/L (ref 39–117)
ALT: 16 IU/L (ref 0–32)
AST: 17 IU/L (ref 0–40)
BUN/Creatinine Ratio: 12 (ref 8–20)
BUN: 9 mg/dL (ref 6–20)
Bilirubin Total: 0.4 mg/dL (ref 0.0–1.2)
CO2: 23 mmol/L (ref 18–29)
Calcium: 9.8 mg/dL (ref 8.7–10.2)
Chloride: 101 mmol/L (ref 97–106)
Creatinine, Ser: 0.78 mg/dL (ref 0.57–1.00)
GFR calc Af Amer: 113 mL/min/{1.73_m2} (ref >59)
GFR, EST NON AFRICAN AMERICAN: 98 mL/min/{1.73_m2} (ref >59)
GLOBULIN, TOTAL: 2.3 g/dL (ref 1.5–4.5)
Glucose: 85 mg/dL (ref 65–99)
Potassium: 4.4 mmol/L (ref 3.5–5.2)
Sodium: 139 mmol/L (ref 136–144)
Total Protein: 6.8 g/dL (ref 6.0–8.5)

## 2015-07-18 LAB — LIPID PANEL
CHOL/HDL RATIO: 5.2 ratio — AB (ref 0.0–4.4)
Cholesterol, Total: 192 mg/dL (ref 100–199)
HDL: 37 mg/dL — ABNORMAL LOW (ref >39)
LDL CALC: 128 mg/dL — AB (ref 0–99)
TRIGLYCERIDES: 137 mg/dL (ref 0–149)
VLDL Cholesterol Cal: 27 mg/dL (ref 5–40)

## 2015-07-18 MED ORDER — VITAMIN D (ERGOCALCIFEROL) 1.25 MG (50000 UNIT) PO CAPS: 50000.0000 [IU] | ORAL_CAPSULE | ORAL | Status: DC

## 2015-07-18 NOTE — Progress Notes (Signed)
PATIENT AWARE OF LABS AND RX FOR D.

## 2015-07-31 ENCOUNTER — Telehealth: Payer: Self-pay | Admitting: Nurse Practitioner

## 2015-08-01 NOTE — Telephone Encounter (Signed)
Is this patient's first Depo-Provera shot? Some abnormal bleeding is normal until hormones are regulated. If continues patient will need to be seen.

## 2015-08-01 NOTE — Telephone Encounter (Signed)
Patient aware.

## 2015-08-02 ENCOUNTER — Telehealth: Payer: Self-pay

## 2015-08-02 ENCOUNTER — Ambulatory Visit (INDEPENDENT_AMBULATORY_CARE_PROVIDER_SITE_OTHER): Payer: Medicaid Other | Admitting: Family

## 2015-08-02 ENCOUNTER — Encounter: Payer: Self-pay | Admitting: Family

## 2015-08-02 VITALS — BP 124/81 | HR 102 | Temp 97.7°F | Ht 66.0 in | Wt 199.0 lb

## 2015-08-02 DIAGNOSIS — M549 Dorsalgia, unspecified: Secondary | ICD-10-CM

## 2015-08-02 DIAGNOSIS — M5136 Other intervertebral disc degeneration, lumbar region: Secondary | ICD-10-CM

## 2015-08-02 DIAGNOSIS — M25561 Pain in right knee: Secondary | ICD-10-CM

## 2015-08-02 DIAGNOSIS — G8929 Other chronic pain: Secondary | ICD-10-CM | POA: Diagnosis not present

## 2015-08-02 DIAGNOSIS — Z8249 Family history of ischemic heart disease and other diseases of the circulatory system: Secondary | ICD-10-CM | POA: Diagnosis not present

## 2015-08-02 MED ORDER — NAPROXEN 500 MG PO TABS: 500.0000 mg | ORAL_TABLET | Freq: Two times a day (BID) | ORAL | Status: DC

## 2015-08-02 MED ORDER — CYCLOBENZAPRINE HCL 10 MG PO TABS: 10.0000 mg | ORAL_TABLET | Freq: Three times a day (TID) | ORAL | Status: DC | PRN

## 2015-08-02 NOTE — Telephone Encounter (Signed)
Patient states she saw Alyse Low this AM and after reading her after visit summary when she got home she is concerned that her pulse rate is too high. After reviewing previous pulse rates patient wanted you to review and see what you think. She is not having any problems.

## 2015-08-02 NOTE — Patient Instructions (Signed)

## 2015-08-02 NOTE — Progress Notes (Signed)
Subjective:    Patient ID: Yesenia Gates, female    DOB: [birth year], 34 y.o.   MRN: [id number]  HPI PT presents to the office today with bilateral knee knee and ankle pain. Pt states this pain started a month ago. Pt states her her right knee pain is greater than her left knee. Pt states states she is having a constant pain of 7 out 10 pain. Pt denies taking any medication at this time. PT states she has degenerative disc disease and is worried that this chronic back pain is causing her back pain. Pt also states she have a family history of cardiac problems and wants to "get her cardiac labs checked".    Review of Systems  Constitutional: Negative.   HENT: Negative.   Eyes: Negative.   Respiratory: Negative.  Negative for shortness of breath.   Cardiovascular: Negative.  Negative for palpitations.  Gastrointestinal: Negative.   Endocrine: Negative.   Genitourinary: Negative.   Musculoskeletal: Negative.   Neurological: Negative.  Negative for headaches.  Hematological: Negative.   Psychiatric/Behavioral: Negative.   All other systems reviewed and are negative.      Objective:   Physical Exam  Constitutional: She is oriented to person, place, and time. She appears well-developed and well-nourished. No distress.  HENT:  Head: Normocephalic and atraumatic.  Right Ear: External ear normal.  Left Ear: External ear normal.  Nose: Nose normal.  Mouth/Throat: Oropharynx is clear and moist.  Eyes: Pupils are equal, round, and reactive to light.  Neck: Normal range of motion. Neck supple. No thyromegaly present.  Cardiovascular: Normal rate, regular rhythm, normal heart sounds and intact distal pulses.   No murmur heard. Pulmonary/Chest: Effort normal and breath sounds normal. No respiratory distress. She has no wheezes.  Abdominal: Soft. Bowel sounds are normal. She exhibits no distension. There is no tenderness.  Musculoskeletal: Normal range of motion. She exhibits no edema  or tenderness.  Full ROM of joints   Neurological: She is alert and oriented to person, place, and time. She has normal reflexes. No cranial nerve deficit.  Skin: Skin is warm and dry.  Psychiatric: She has a normal mood and affect. Her behavior is normal. Judgment and thought content normal.  Vitals reviewed.     BP 124/81 mmHg  Pulse 102  Temp(Src) 97.7 F (36.5 C) (Oral)  Ht $R'5\' 6"'bP$  (1.676 m)  Wt 199 lb (90.266 kg)  BMI 32.13 kg/m2  LMP 07/17/2015     Assessment & Plan:  1. Right knee pain -Pt to take naprosyn BID for next 7-10 days with food -No other NSAID's while taking naprosyn  -Sedation precaution discussed with flexeril  - CMP14+EGFR - Arthritis Panel - naproxen (NAPROSYN) 500 MG tablet; Take 1 tablet (500 mg total) by mouth 2 (two) times daily with a meal.  Dispense: 30 tablet; Refill: 0 - cyclobenzaprine (FLEXERIL) 10 MG tablet; Take 1 tablet (10 mg total) by mouth 3 (three) times daily as needed for muscle spasms.  Dispense: 30 tablet; Refill: 0  2. Chronic back pain - CMP14+EGFR  3. Degenerative disc disease, lumbar - CMP14+EGFR - naproxen (NAPROSYN) 500 MG tablet; Take 1 tablet (500 mg total) by mouth 2 (two) times daily with a meal.  Dispense: 30 tablet; Refill: 0 - cyclobenzaprine (FLEXERIL) 10 MG tablet; Take 1 tablet (10 mg total) by mouth 3 (three) times daily as needed for muscle spasms.  Dispense: 30 tablet; Refill: 0  4. Family history of CHF (congestive heart failure) - CMP14+EGFR -  Brain natriuretic peptide   Continue all meds Labs pending Health Maintenance reviewed Diet and exercise encouraged RTO as needed- Keep chronic follow up  Evelina Dun, FNP

## 2015-08-03 LAB — ARTHRITIS PANEL
BASOS ABS: 0 10*3/uL (ref 0.0–0.2)
Basos: 0 %
EOS (ABSOLUTE): 0.2 10*3/uL (ref 0.0–0.4)
Eos: 2 %
HEMATOCRIT: 39.7 % (ref 34.0–46.6)
Hemoglobin: 13.2 g/dL (ref 11.1–15.9)
IMMATURE GRANS (ABS): 0 10*3/uL (ref 0.0–0.1)
IMMATURE GRANULOCYTES: 0 %
LYMPHS: 26 %
Lymphocytes Absolute: 2.3 10*3/uL (ref 0.7–3.1)
MCH: 28.1 pg (ref 26.6–33.0)
MCHC: 33.2 g/dL (ref 31.5–35.7)
MCV: 85 fL (ref 79–97)
MONOS ABS: 0.4 10*3/uL (ref 0.1–0.9)
Monocytes: 5 %
NEUTROS PCT: 67 %
Neutrophils Absolute: 5.8 10*3/uL (ref 1.4–7.0)
PLATELETS: 253 10*3/uL (ref 150–379)
RBC: 4.7 x10E6/uL (ref 3.77–5.28)
RDW: 16.3 % — AB (ref 12.3–15.4)
RHEUMATOID FACTOR: 13.7 [IU]/mL (ref 0.0–13.9)
Sed Rate: 3 mm/hr (ref 0–32)
URIC ACID: 4.6 mg/dL (ref 2.5–7.1)
WBC: 8.7 10*3/uL (ref 3.4–10.8)

## 2015-08-03 LAB — CMP14+EGFR
ALBUMIN: 4.5 g/dL (ref 3.5–5.5)
ALK PHOS: 94 IU/L (ref 39–117)
ALT: 17 IU/L (ref 0–32)
AST: 13 IU/L (ref 0–40)
Albumin/Globulin Ratio: 1.8 (ref 1.1–2.5)
BUN / CREAT RATIO: 10 (ref 8–20)
BUN: 8 mg/dL (ref 6–20)
Bilirubin Total: 0.6 mg/dL (ref 0.0–1.2)
CHLORIDE: 103 mmol/L (ref 97–106)
CO2: 21 mmol/L (ref 18–29)
CREATININE: 0.82 mg/dL (ref 0.57–1.00)
Calcium: 9.6 mg/dL (ref 8.7–10.2)
GFR calc Af Amer: 106 mL/min/{1.73_m2} (ref >59)
GFR calc non Af Amer: 92 mL/min/{1.73_m2} (ref >59)
GLUCOSE: 96 mg/dL (ref 65–99)
Globulin, Total: 2.5 g/dL (ref 1.5–4.5)
Potassium: 4.1 mmol/L (ref 3.5–5.2)
Sodium: 144 mmol/L (ref 136–144)
Total Protein: 7 g/dL (ref 6.0–8.5)

## 2015-08-03 LAB — BRAIN NATRIURETIC PEPTIDE: BNP: 5.9 pg/mL (ref 0.0–100.0)

## 2015-08-04 ENCOUNTER — Telehealth: Payer: Self-pay | Admitting: Nurse Practitioner

## 2015-08-04 NOTE — Telephone Encounter (Signed)
Pulse rate at this was ok. Pain and anxiety can increase your pulse rate.

## 2015-08-04 NOTE — Telephone Encounter (Signed)
Patient states that her knee pain and swelling is no better. Patient wants to know what you think it could be. Please advise. Patient aware you are out of the office until Monday.

## 2015-08-07 NOTE — Telephone Encounter (Signed)
I believe her knee pain is osteoarthritis. Pt to take the naproxen BID for next 10-14 days. If pain continue pt may benefit from PT?

## 2015-08-11 ENCOUNTER — Telehealth: Payer: Self-pay | Admitting: Nurse Practitioner

## 2015-08-11 DIAGNOSIS — Z30013 Encounter for initial prescription of injectable contraceptive: Secondary | ICD-10-CM

## 2015-08-11 MED ORDER — MEDROXYPROGESTERONE ACETATE 150 MG/ML IM SUSP: 150.0000 mg | Freq: Once | INTRAMUSCULAR | Status: DC

## 2015-08-11 NOTE — Telephone Encounter (Signed)
Prescription sent to pharmacy.

## 2015-08-16 ENCOUNTER — Telehealth: Payer: Self-pay | Admitting: Nurse Practitioner

## 2015-08-16 NOTE — Telephone Encounter (Signed)
Stp and advised she is due for her depo, and the last day she can receive it is 12/12 so keep appt on 12/9.

## 2015-08-18 ENCOUNTER — Ambulatory Visit (INDEPENDENT_AMBULATORY_CARE_PROVIDER_SITE_OTHER): Payer: Medicaid Other | Admitting: *Deleted

## 2015-08-18 DIAGNOSIS — Z30013 Encounter for initial prescription of injectable contraceptive: Secondary | ICD-10-CM | POA: Diagnosis not present

## 2015-08-18 NOTE — Progress Notes (Signed)
Pt given Depo-provera 150mg  injection IM LUOQ and tolerated well.

## 2015-08-18 NOTE — Patient Instructions (Signed)

## 2015-08-21 NOTE — Telephone Encounter (Signed)
Aware ,let us know in 2 weeks, if still in pain and wants physical therapy.

## 2015-09-21 ENCOUNTER — Ambulatory Visit (INDEPENDENT_AMBULATORY_CARE_PROVIDER_SITE_OTHER): Payer: Medicaid Other | Admitting: Nurse Practitioner

## 2015-09-21 ENCOUNTER — Encounter: Payer: Self-pay | Admitting: Nurse Practitioner

## 2015-09-21 ENCOUNTER — Ambulatory Visit (INDEPENDENT_AMBULATORY_CARE_PROVIDER_SITE_OTHER): Payer: Medicaid Other

## 2015-09-21 VITALS — BP 109/69 | HR 96 | Temp 97.0°F | Ht 66.0 in | Wt 198.0 lb

## 2015-09-21 DIAGNOSIS — N61 Mastitis without abscess: Secondary | ICD-10-CM

## 2015-09-21 DIAGNOSIS — M25561 Pain in right knee: Secondary | ICD-10-CM

## 2015-09-21 MED ORDER — MELOXICAM 15 MG PO TABS: 15.0000 mg | ORAL_TABLET | Freq: Every day | ORAL | Status: DC

## 2015-09-21 NOTE — Progress Notes (Signed)
   Subjective:    Patient ID: Yesenia Gates, female    DOB: Mar 02, 1981, 35 y.o.   MRN: [id number]  HPI Patient in today with 2 complaints: - cyst on left breast- has been to er and was given keflex 500mg  TID- has since busted and drained and is getting better. - Saw Evelina Dun with right knee pain and was given nearosyn which really has not helped. Rated 5/10- worse with movement and weight bearing- resting makes pain better.   Review of Systems  Constitutional: Negative.   HENT: Negative.   Respiratory: Negative.   Cardiovascular: Negative.   Genitourinary: Negative.   Musculoskeletal: Positive for joint swelling.  Neurological: Negative.   Psychiatric/Behavioral: Negative.   All other systems reviewed and are negative.      Objective:   Physical Exam  Constitutional: She is oriented to person, place, and time. She appears well-developed and well-nourished.  Cardiovascular: Normal rate and normal heart sounds.   Pulmonary/Chest: Effort normal and breath sounds normal.    Musculoskeletal:  No Patella tendernss FROM without pain Clicking sensation with extension All ligaments intact  Neurological: She is alert and oriented to person, place, and time.  Skin: Skin is warm.  Psychiatric: She has a normal mood and affect. Her behavior is normal. Judgment and thought content normal.   BP 109/69 mmHg  Pulse 96  Temp(Src) 97 F (36.1 C) (Oral)  Ht 5\' 6"  (1.676 m)  Wt 198 lb (89.812 kg)  BMI 31.97 kg/m2  knee xray- normal appearing knee-Preliminary reading by Ronnald Collum, FNP  [facility]  Procedure- exsanguinated thick whitish excudate from lesion on left breast.      Assessment & Plan:  1. Right knee pain Ice as needed Rest Compression sleeve when walking Elevate when sitting - DG Knee 1-2 Views Right; Future - Ambulatory referral to Orthopedic Surgery  2. Cellulitis of left breast Continue warm compresses Finish antibiotic- Clean daily with antibacterial  soap RTO prn  Yesenia Hassell Done, FNP

## 2015-09-21 NOTE — Patient Instructions (Signed)

## 2015-11-07 ENCOUNTER — Other Ambulatory Visit: Payer: Self-pay | Admitting: Family

## 2015-11-13 ENCOUNTER — Other Ambulatory Visit: Payer: Medicaid Other | Admitting: Family

## 2015-11-17 ENCOUNTER — Ambulatory Visit (INDEPENDENT_AMBULATORY_CARE_PROVIDER_SITE_OTHER): Payer: Medicaid Other | Admitting: Family

## 2015-11-17 ENCOUNTER — Encounter: Payer: Self-pay | Admitting: Family

## 2015-11-17 VITALS — BP 100/65 | HR 87 | Temp 98.1°F | Ht 66.0 in | Wt 201.2 lb

## 2015-11-17 DIAGNOSIS — E559 Vitamin D deficiency, unspecified: Secondary | ICD-10-CM | POA: Diagnosis not present

## 2015-11-17 DIAGNOSIS — J309 Allergic rhinitis, unspecified: Secondary | ICD-10-CM

## 2015-11-17 DIAGNOSIS — Z01419 Encounter for gynecological examination (general) (routine) without abnormal findings: Secondary | ICD-10-CM

## 2015-11-17 DIAGNOSIS — E785 Hyperlipidemia, unspecified: Secondary | ICD-10-CM

## 2015-11-17 DIAGNOSIS — Z0000 Encounter for general adult medical examination without abnormal findings: Secondary | ICD-10-CM

## 2015-11-17 DIAGNOSIS — F17200 Nicotine dependence, unspecified, uncomplicated: Secondary | ICD-10-CM

## 2015-11-17 MED ORDER — FLUTICASONE PROPIONATE 50 MCG/ACT NA SUSP: 2.0000 | Freq: Every day | NASAL | Status: DC

## 2015-11-17 NOTE — Patient Instructions (Signed)
Health Maintenance, Female Adopting a healthy lifestyle and getting preventive care can go a long way to promote health and wellness. Talk with your health care provider about what schedule of regular examinations is right for you. This is a good chance for you to check in with your provider about disease prevention and staying healthy. In between checkups, there are plenty of things you can do on your own. Experts have done a lot of research about which lifestyle changes and preventive measures are most likely to keep you healthy. Ask your health care provider for more information. WEIGHT AND DIET  Eat a healthy diet  Be sure to include plenty of vegetables, fruits, low-fat dairy products, and lean protein.  Do not eat a lot of foods high in solid fats, added sugars, or salt.  Get regular exercise. This is one of the most important things you can do for your health.  Most adults should exercise for at least 150 minutes each week. The exercise should increase your heart rate and make you sweat (moderate-intensity exercise).  Most adults should also do strengthening exercises at least twice a week. This is in addition to the moderate-intensity exercise.  Maintain a healthy weight  Body mass index (BMI) is a measurement that can be used to identify possible weight problems. It estimates body fat based on height and weight. Your health care provider can help determine your BMI and help you achieve or maintain a healthy weight.  For females 20 years of age and older:   A BMI below 18.5 is considered underweight.  A BMI of 18.5 to 24.9 is normal.  A BMI of 25 to 29.9 is considered overweight.  A BMI of 30 and above is considered obese.  Watch levels of cholesterol and blood lipids  You should start having your blood tested for lipids and cholesterol at 37 years of age, then have this test every 5 years.  You may need to have your cholesterol levels checked more often if:  Your lipid  or cholesterol levels are high.  You are older than 37 years of age.  You are at high risk for heart disease.  CANCER SCREENING   Lung Cancer  Lung cancer screening is recommended for adults 55-80 years old who are at high risk for lung cancer because of a history of smoking.  A yearly low-dose CT scan of the lungs is recommended for people who:  Currently smoke.  Have quit within the past 15 years.  Have at least a 30-pack-year history of smoking. A pack year is smoking an average of one pack of cigarettes a day for 1 year.  Yearly screening should continue until it has been 15 years since you quit.  Yearly screening should stop if you develop a health problem that would prevent you from having lung cancer treatment.  Breast Cancer  Practice breast self-awareness. This means understanding how your breasts normally appear and feel.  It also means doing regular breast self-exams. Let your health care provider know about any changes, no matter how small.  If you are in your 20s or 30s, you should have a clinical breast exam (CBE) by a health care provider every 1-3 years as part of a regular health exam.  If you are 40 or older, have a CBE every year. Also consider having a breast X-ray (mammogram) every year.  If you have a family history of breast cancer, talk to your health care provider about genetic screening.  If you   are at high risk for breast cancer, talk to your health care provider about having an MRI and a mammogram every year.  Breast cancer gene (BRCA) assessment is recommended for women who have family members with BRCA-related cancers. BRCA-related cancers include:  Breast.  Ovarian.  Tubal.  Peritoneal cancers.  Results of the assessment will determine the need for genetic counseling and BRCA1 and BRCA2 testing. Cervical Cancer Your health care provider may recommend that you be screened regularly for cancer of the pelvic organs (ovaries, uterus, and  vagina). This screening involves a pelvic examination, including checking for microscopic changes to the surface of your cervix (Pap test). You may be encouraged to have this screening done every 3 years, beginning at age 21.  For women ages 30-65, health care providers may recommend pelvic exams and Pap testing every 3 years, or they may recommend the Pap and pelvic exam, combined with testing for human papilloma virus (HPV), every 5 years. Some types of HPV increase your risk of cervical cancer. Testing for HPV may also be done on women of any age with unclear Pap test results.  Other health care providers may not recommend any screening for nonpregnant women who are considered low risk for pelvic cancer and who do not have symptoms. Ask your health care provider if a screening pelvic exam is right for you.  If you have had past treatment for cervical cancer or a condition that could lead to cancer, you need Pap tests and screening for cancer for at least 20 years after your treatment. If Pap tests have been discontinued, your risk factors (such as having a new sexual partner) need to be reassessed to determine if screening should resume. Some women have medical problems that increase the chance of getting cervical cancer. In these cases, your health care provider may recommend more frequent screening and Pap tests. Colorectal Cancer  This type of cancer can be detected and often prevented.  Routine colorectal cancer screening usually begins at 37 years of age and continues through 37 years of age.  Your health care provider may recommend screening at an earlier age if you have risk factors for colon cancer.  Your health care provider may also recommend using home test kits to check for hidden blood in the stool.  A small camera at the end of a tube can be used to examine your colon directly (sigmoidoscopy or colonoscopy). This is done to check for the earliest forms of colorectal  cancer.  Routine screening usually begins at age 50.  Direct examination of the colon should be repeated every 5-10 years through 37 years of age. However, you may need to be screened more often if early forms of precancerous polyps or small growths are found. Skin Cancer  Check your skin from head to toe regularly.  Tell your health care provider about any new moles or changes in moles, especially if there is a change in a mole's shape or color.  Also tell your health care provider if you have a mole that is larger than the size of a pencil eraser.  Always use sunscreen. Apply sunscreen liberally and repeatedly throughout the day.  Protect yourself by wearing long sleeves, pants, a wide-brimmed hat, and sunglasses whenever you are outside. HEART DISEASE, DIABETES, AND HIGH BLOOD PRESSURE   High blood pressure causes heart disease and increases the risk of stroke. High blood pressure is more likely to develop in:  People who have blood pressure in the high end   of the normal range (130-139/85-89 mm Hg).  People who are overweight or obese.  People who are African American.  If you are 38-23 years of age, have your blood pressure checked every 3-5 years. If you are 61 years of age or older, have your blood pressure checked every year. You should have your blood pressure measured twice--once when you are at a hospital or clinic, and once when you are not at a hospital or clinic. Record the average of the two measurements. To check your blood pressure when you are not at a hospital or clinic, you can use:  An automated blood pressure machine at a pharmacy.  A home blood pressure monitor.  If you are between 45 years and 39 years old, ask your health care provider if you should take aspirin to prevent strokes.  Have regular diabetes screenings. This involves taking a blood sample to check your fasting blood sugar level.  If you are at a normal weight and have a low risk for diabetes,  have this test once every three years after 37 years of age.  If you are overweight and have a high risk for diabetes, consider being tested at a younger age or more often. PREVENTING INFECTION  Hepatitis B  If you have a higher risk for hepatitis B, you should be screened for this virus. You are considered at high risk for hepatitis B if:  You were born in a country where hepatitis B is common. Ask your health care provider which countries are considered high risk.  Your parents were born in a high-risk country, and you have not been immunized against hepatitis B (hepatitis B vaccine).  You have HIV or AIDS.  You use needles to inject street drugs.  You live with someone who has hepatitis B.  You have had sex with someone who has hepatitis B.  You get hemodialysis treatment.  You take certain medicines for conditions, including cancer, organ transplantation, and autoimmune conditions. Hepatitis C  Blood testing is recommended for:  Everyone born from [birth year] through 1967.  Anyone with known risk factors for hepatitis C. Sexually transmitted infections (STIs)  You should be screened for sexually transmitted infections (STIs) including gonorrhea and chlamydia if:  You are sexually active and are younger than 37 years of age.  You are older than 37 years of age and your health care provider tells you that you are at risk for this type of infection.  Your sexual activity has changed since you were last screened and you are at an increased risk for chlamydia or gonorrhea. Ask your health care provider if you are at risk.  If you do not have HIV, but are at risk, it may be recommended that you take a prescription medicine daily to prevent HIV infection. This is called pre-exposure prophylaxis (PrEP). You are considered at risk if:  You are sexually active and do not regularly use condoms or know the HIV status of your partner(s).  You take drugs by injection.  You are sexually  active with a partner who has HIV. Talk with your health care provider about whether you are at high risk of being infected with HIV. If you choose to begin PrEP, you should first be tested for HIV. You should then be tested every 3 months for as long as you are taking PrEP.  PREGNANCY   If you are premenopausal and you may become pregnant, ask your health care provider about preconception counseling.  If you may  become pregnant, take 400 to 800 micrograms (mcg) of folic acid every day.  If you want to prevent pregnancy, talk to your health care provider about birth control (contraception). OSTEOPOROSIS AND MENOPAUSE   Osteoporosis is a disease in which the bones lose minerals and strength with aging. This can result in serious bone fractures. Your risk for osteoporosis can be identified using a bone density scan.  If you are 61 years of age or older, or if you are at risk for osteoporosis and fractures, ask your health care provider if you should be screened.  Ask your health care provider whether you should take a calcium or vitamin D supplement to lower your risk for osteoporosis.  Menopause may have certain physical symptoms and risks.  Hormone replacement therapy may reduce some of these symptoms and risks. Talk to your health care provider about whether hormone replacement therapy is right for you.  HOME CARE INSTRUCTIONS   Schedule regular health, dental, and eye exams.  Stay current with your immunizations.   Do not use any tobacco products including cigarettes, chewing tobacco, or electronic cigarettes.  If you are pregnant, do not drink alcohol.  If you are breastfeeding, limit how much and how often you drink alcohol.  Limit alcohol intake to no more than 1 drink per day for nonpregnant women. One drink equals 12 ounces of beer, 5 ounces of wine, or 1 ounces of hard liquor.  Do not use street drugs.  Do not share needles.  Ask your health care provider for help if  you need support or information about quitting drugs.  Tell your health care provider if you often feel depressed.  Tell your health care provider if you have ever been abused or do not feel safe at home.   This information is not intended to replace advice given to you by your health care provider. Make sure you discuss any questions you have with your health care provider.   Document Released: 03/11/2011 Document Revised: 09/16/2014 Document Reviewed: 07/28/2013 Elsevier Interactive Patient Education [employer].

## 2015-11-17 NOTE — Progress Notes (Signed)
   Subjective:    Patient ID: Yesenia Gates, female    DOB: 05-May-1981, 35 y.o.   MRN: [id number]  Pt presents to the office today for CPE with pap.  Gynecologic Exam Pertinent negatives include no headaches.  Hyperlipidemia Pertinent negatives include no shortness of breath.      Review of Systems  Constitutional: Negative.   HENT: Negative.   Eyes: Negative.   Respiratory: Negative.  Negative for shortness of breath.   Cardiovascular: Negative.  Negative for palpitations.  Gastrointestinal: Negative.   Endocrine: Negative.   Genitourinary: Negative.   Musculoskeletal: Negative.   Neurological: Negative.  Negative for headaches.  Hematological: Negative.   Psychiatric/Behavioral: Negative.   All other systems reviewed and are negative.      Objective:   Physical Exam  Constitutional: She is oriented to person, place, and time. She appears well-developed and well-nourished. No distress.  HENT:  Head: Normocephalic and atraumatic.  Right Ear: External ear normal.  Left Ear: External ear normal.  Nose: Nose normal.  Mouth/Throat: Oropharynx is clear and moist.  Eyes: Pupils are equal, round, and reactive to light.  Neck: Normal range of motion. Neck supple. No thyromegaly present.  Cardiovascular: Normal rate, regular rhythm, normal heart sounds and intact distal pulses.   No murmur heard. Pulmonary/Chest: Effort normal and breath sounds normal. No respiratory distress. She has no wheezes. Right breast exhibits no inverted nipple, no mass, no nipple discharge, no skin change and no tenderness. Left breast exhibits no inverted nipple, no mass, no nipple discharge, no skin change and no tenderness. Breasts are symmetrical.  Abdominal: Soft. Bowel sounds are normal. She exhibits no distension. There is no tenderness.  Genitourinary: Vagina normal.  Bimanual exam- no adnexal masses or tenderness, ovaries nonpalpable   Cervix parous and pink- No discharge     Musculoskeletal: Normal range of motion. She exhibits no edema or tenderness.  Neurological: She is alert and oriented to person, place, and time. She has normal reflexes. No cranial nerve deficit.  Skin: Skin is warm and dry.  Psychiatric: She has a normal mood and affect. Her behavior is normal. Judgment and thought content normal.  Vitals reviewed.   BP 100/65 mmHg  Pulse 87  Temp(Src) 98.1 F (36.7 C) (Oral)  Ht _0  (1.676 m)  Wt 201 lb 3.2 oz (91.264 kg)  BMI 32.49 kg/m2  LMP  (Approximate)       Assessment & Plan:  1. Allergic rhinitis, unspecified allergic rhinitis type - CMP14+EGFR; Future  2. Hyperlipidemia - CMP14+EGFR; Future - Lipid panel; Future  3. Vitamin D deficiency - CMP14+EGFR; Future - VITAMIN D 25 Hydroxy (Vit-D Deficiency, Fractures); Future  4. Current smoker - CMP14+EGFR; Future  5. Annual physical exam - Anemia Profile B; Future - CMP14+EGFR; Future - Lipid panel; Future - VITAMIN D 25 Hydroxy (Vit-D Deficiency, Fractures); Future - Pap IG, CT/NG w/ reflex HPV when ASC-U  6. Encounter for routine gynecological examination - CMP14+EGFR; Future - Pap IG, CT/NG w/ reflex HPV when ASC-U   Continue all meds Labs order- Pt ate [name] prior to visit, labs ordered and patient to come within next week to have blood work drawn Health Maintenance reviewed Diet and exercise encouraged RTO 6 months  Evelina Dun, FNP

## 2015-11-17 NOTE — Addendum Note (Signed)
Addended by: Evelina Dun A on: 11/17/2015 12:13 PM   Modules accepted: Orders, SmartSet

## 2015-11-21 LAB — PAP IG, CT-NG, RFX HPV ASCU
Chlamydia, Nuc. Acid Amp: NEGATIVE
GONOCOCCUS BY NUCLEIC ACID AMP: NEGATIVE
PAP Smear Comment: 0

## 2015-12-08 ENCOUNTER — Other Ambulatory Visit: Payer: Medicaid Other

## 2015-12-08 DIAGNOSIS — E559 Vitamin D deficiency, unspecified: Secondary | ICD-10-CM

## 2015-12-08 DIAGNOSIS — E785 Hyperlipidemia, unspecified: Secondary | ICD-10-CM

## 2015-12-08 DIAGNOSIS — F17200 Nicotine dependence, unspecified, uncomplicated: Secondary | ICD-10-CM

## 2015-12-08 DIAGNOSIS — J309 Allergic rhinitis, unspecified: Secondary | ICD-10-CM

## 2015-12-08 DIAGNOSIS — Z01419 Encounter for gynecological examination (general) (routine) without abnormal findings: Secondary | ICD-10-CM

## 2015-12-08 DIAGNOSIS — Z0000 Encounter for general adult medical examination without abnormal findings: Secondary | ICD-10-CM

## 2015-12-09 LAB — LIPID PANEL
CHOLESTEROL TOTAL: 105 mg/dL (ref 100–199)
Chol/HDL Ratio: 3 ratio units (ref 0.0–4.4)
HDL: 35 mg/dL — ABNORMAL LOW (ref >39)
LDL CALC: 51 mg/dL (ref 0–99)
TRIGLYCERIDES: 94 mg/dL (ref 0–149)
VLDL Cholesterol Cal: 19 mg/dL (ref 5–40)

## 2015-12-09 LAB — CMP14+EGFR
ALK PHOS: 97 IU/L (ref 39–117)
ALT: 14 IU/L (ref 0–32)
AST: 9 IU/L (ref 0–40)
Albumin/Globulin Ratio: 2.1 (ref 1.2–2.2)
Albumin: 4.5 g/dL (ref 3.5–5.5)
BUN/Creatinine Ratio: 12 (ref 8–20)
BUN: 8 mg/dL (ref 6–20)
Bilirubin Total: 0.3 mg/dL (ref 0.0–1.2)
CO2: 20 mmol/L (ref 18–29)
CREATININE: 0.69 mg/dL (ref 0.57–1.00)
Calcium: 9.3 mg/dL (ref 8.7–10.2)
Chloride: 103 mmol/L (ref 96–106)
GFR calc Af Amer: 129 mL/min/{1.73_m2} (ref >59)
GFR calc non Af Amer: 112 mL/min/{1.73_m2} (ref >59)
GLOBULIN, TOTAL: 2.1 g/dL (ref 1.5–4.5)
GLUCOSE: 94 mg/dL (ref 65–99)
Potassium: 3.8 mmol/L (ref 3.5–5.2)
SODIUM: 142 mmol/L (ref 134–144)
Total Protein: 6.6 g/dL (ref 6.0–8.5)

## 2015-12-09 LAB — ANEMIA PROFILE B
BASOS ABS: 0 10*3/uL (ref 0.0–0.2)
Basos: 0 %
EOS (ABSOLUTE): 0.2 10*3/uL (ref 0.0–0.4)
Eos: 2 %
FOLATE: 18.7 ng/mL (ref >3.0)
Ferritin: 23 ng/mL (ref 15–150)
Hematocrit: 37.7 % (ref 34.0–46.6)
Hemoglobin: 12.8 g/dL (ref 11.1–15.9)
IMMATURE GRANULOCYTES: 0 %
IRON SATURATION: 13 % — AB (ref 15–55)
Immature Grans (Abs): 0 10*3/uL (ref 0.0–0.1)
Iron: 44 ug/dL (ref 27–159)
LYMPHS ABS: 3 10*3/uL (ref 0.7–3.1)
LYMPHS: 40 %
MCH: 29.1 pg (ref 26.6–33.0)
MCHC: 34 g/dL (ref 31.5–35.7)
MCV: 86 fL (ref 79–97)
MONOS ABS: 0.3 10*3/uL (ref 0.1–0.9)
Monocytes: 4 %
NEUTROS PCT: 54 %
Neutrophils Absolute: 4.1 10*3/uL (ref 1.4–7.0)
PLATELETS: 226 10*3/uL (ref 150–379)
RBC: 4.4 x10E6/uL (ref 3.77–5.28)
RDW: 15.2 % (ref 12.3–15.4)
Retic Ct Pct: 1 % (ref 0.6–2.6)
Total Iron Binding Capacity: 346 ug/dL (ref 250–450)
UIBC: 302 ug/dL (ref 131–425)
Vitamin B-12: 362 pg/mL (ref 211–946)
WBC: 7.6 10*3/uL (ref 3.4–10.8)

## 2015-12-09 LAB — VITAMIN D 25 HYDROXY (VIT D DEFICIENCY, FRACTURES): Vit D, 25-Hydroxy: 39 ng/mL (ref 30.0–100.0)

## 2016-01-15 ENCOUNTER — Ambulatory Visit: Payer: Medicaid Other | Admitting: Family

## 2016-01-16 ENCOUNTER — Encounter: Payer: Self-pay | Admitting: Family

## 2016-02-13 ENCOUNTER — Ambulatory Visit: Payer: Medicaid Other | Admitting: Nurse Practitioner

## 2016-02-19 ENCOUNTER — Ambulatory Visit (INDEPENDENT_AMBULATORY_CARE_PROVIDER_SITE_OTHER): Payer: Medicaid Other | Admitting: *Deleted

## 2016-02-19 ENCOUNTER — Telehealth: Payer: Self-pay | Admitting: Family

## 2016-02-19 DIAGNOSIS — Z30013 Encounter for initial prescription of injectable contraceptive: Secondary | ICD-10-CM

## 2016-02-19 DIAGNOSIS — N921 Excessive and frequent menstruation with irregular cycle: Secondary | ICD-10-CM

## 2016-02-19 DIAGNOSIS — Z3049 Encounter for surveillance of other contraceptives: Secondary | ICD-10-CM | POA: Diagnosis not present

## 2016-02-19 DIAGNOSIS — Z3042 Encounter for surveillance of injectable contraceptive: Secondary | ICD-10-CM

## 2016-02-19 LAB — PREGNANCY, URINE: PREG TEST UR: NEGATIVE

## 2016-02-19 MED ORDER — MEDROXYPROGESTERONE ACETATE 150 MG/ML IM SUSP: 150.0000 mg | Freq: Once | INTRAMUSCULAR | Status: DC

## 2016-02-19 MED ORDER — MEDROXYPROGESTERONE ACETATE 150 MG/ML IM SUSP
150.0000 mg | INTRAMUSCULAR | Status: AC
  Administered 2016-02-19: 150 mg via INTRAMUSCULAR

## 2016-02-19 NOTE — Progress Notes (Signed)
Pt given Depo Provera 150mg IM RUOQ and tolerated well. 

## 2016-02-19 NOTE — Telephone Encounter (Signed)
Medication sent to pharmacy  

## 2016-02-20 ENCOUNTER — Encounter: Payer: Self-pay | Admitting: Family

## 2016-02-20 ENCOUNTER — Ambulatory Visit (INDEPENDENT_AMBULATORY_CARE_PROVIDER_SITE_OTHER): Payer: Medicaid Other | Admitting: Family

## 2016-02-20 VITALS — BP 119/67 | HR 99 | Temp 99.7°F | Ht 66.0 in | Wt 198.2 lb

## 2016-02-20 DIAGNOSIS — E669 Obesity, unspecified: Secondary | ICD-10-CM | POA: Diagnosis not present

## 2016-02-20 DIAGNOSIS — F411 Generalized anxiety disorder: Secondary | ICD-10-CM | POA: Diagnosis not present

## 2016-02-20 DIAGNOSIS — G43009 Migraine without aura, not intractable, without status migrainosus: Secondary | ICD-10-CM | POA: Diagnosis not present

## 2016-02-20 MED ORDER — ESCITALOPRAM OXALATE 10 MG PO TABS: 10.0000 mg | ORAL_TABLET | Freq: Every day | ORAL | Status: DC

## 2016-02-20 NOTE — Patient Instructions (Signed)
Generalized Anxiety Disorder Generalized anxiety disorder (GAD) is a mental disorder. It interferes with life functions, including relationships, work, and school. GAD is different from normal anxiety, which everyone experiences at some point in their lives in response to specific life events and activities. Normal anxiety actually helps us prepare for and get through these life events and activities. Normal anxiety goes away after the event or activity is over.  GAD causes anxiety that is not necessarily related to specific events or activities. It also causes excess anxiety in proportion to specific events or activities. The anxiety associated with GAD is also difficult to control. GAD can vary from mild to severe. People with severe GAD can have intense waves of anxiety with physical symptoms (panic attacks).  SYMPTOMS The anxiety and worry associated with GAD are difficult to control. This anxiety and worry are related to many life events and activities and also occur more days than not for 6 months or longer. People with GAD also have three or more of the following symptoms (one or more in children):  Restlessness.   Fatigue.  Difficulty concentrating.   Irritability.  Muscle tension.  Difficulty sleeping or unsatisfying sleep. DIAGNOSIS GAD is diagnosed through an assessment by your health care provider. Your health care provider will ask you questions aboutyour mood,physical symptoms, and events in your life. Your health care provider may ask you about your medical history and use of alcohol or drugs, including prescription medicines. Your health care provider may also do a physical exam and blood tests. Certain medical conditions and the use of certain substances can cause symptoms similar to those associated with GAD. Your health care provider may refer you to a mental health specialist for further evaluation. TREATMENT The following therapies are usually used to treat GAD:    Medication. Antidepressant medication usually is prescribed for long-term daily control. Antianxiety medicines may be added in severe cases, especially when panic attacks occur.   Talk therapy (psychotherapy). Certain types of talk therapy can be helpful in treating GAD by providing support, education, and guidance. A form of talk therapy called cognitive behavioral therapy can teach you healthy ways to think about and react to daily life events and activities.  Stress managementtechniques. These include yoga, meditation, and exercise and can be very helpful when they are practiced regularly. A mental health specialist can help determine which treatment is best for you. Some people see improvement with one therapy. However, other people require a combination of therapies.   This information is not intended to replace advice given to you by your health care provider. Make sure you discuss any questions you have with your health care provider.   Document Released: 12/21/2012 Document Revised: 09/16/2014 Document Reviewed: 12/21/2012 Elsevier Interactive Patient Education 2016 Elsevier Inc[employer]  

## 2016-02-20 NOTE — Progress Notes (Signed)
Subjective:    Patient ID: Yesenia Gates, female    DOB: 04-30-1981, 35 y.o.   MRN: [id number]  Headache  This is a new problem. The current episode started more than 1 year ago. The problem occurs intermittently. The problem has been unchanged. The pain is located in the right unilateral region. The pain does not radiate. The pain quality is similar to prior headaches. The quality of the pain is described as aching. The pain is at a severity of 5/10. The pain is mild. Associated symptoms include phonophobia. Pertinent negatives include no back pain, blurred vision, dizziness, ear pain, eye pain, muscle aches, photophobia, seizures, sinus pressure or tingling. She has tried NSAIDs for the symptoms. The treatment provided mild relief. There is no history of cluster headaches, migraine headaches or migraines in the family.  Anxiety Presents for initial visit. Onset was 1 to 5 years ago. The problem has been waxing and waning. Symptoms include chest pain, compulsions, excessive worry, irritability and nervous/anxious behavior. Patient reports no dizziness, palpitations, shortness of breath or suicidal ideas. Symptoms occur most days. The symptoms are aggravated by family issues. The quality of sleep is good.   Her past medical history is significant for anxiety/panic attacks. Past treatments include nothing.      Review of Systems  Constitutional: Positive for irritability.  HENT: Negative for ear pain and sinus pressure.   Eyes: Negative.  Negative for blurred vision, photophobia and pain.  Respiratory: Negative.  Negative for shortness of breath.   Cardiovascular: Positive for chest pain. Negative for palpitations.  Gastrointestinal: Negative.   Endocrine: Negative.   Genitourinary: Negative.   Musculoskeletal: Negative.  Negative for back pain.  Neurological: Positive for headaches. Negative for dizziness, tingling and seizures.  Hematological: Negative.   Psychiatric/Behavioral:  Negative for suicidal ideas. The patient is nervous/anxious.   All other systems reviewed and are negative.      Objective:   Physical Exam  Constitutional: She is oriented to person, place, and time. She appears well-developed and well-nourished. No distress.  HENT:  Head: Normocephalic and atraumatic.  Eyes: Pupils are equal, round, and reactive to light.  Neck: Normal range of motion. Neck supple. No thyromegaly present.  Cardiovascular: Normal rate, regular rhythm, normal heart sounds and intact distal pulses.   No murmur heard. Pulmonary/Chest: Effort normal and breath sounds normal. No respiratory distress. She has no wheezes.  Abdominal: Soft. Bowel sounds are normal. She exhibits no distension. There is no tenderness.  Musculoskeletal: Normal range of motion. She exhibits no edema or tenderness.  Neurological: She is alert and oriented to person, place, and time. She has normal reflexes. No cranial nerve deficit.  Skin: Skin is warm and dry.  Psychiatric: She has a normal mood and affect. Her behavior is normal. Judgment and thought content normal.  Vitals reviewed.  BP 119/67 mmHg  Pulse 99  Temp(Src) 99.7 F (37.6 C) (Oral)  Ht 5\' 6"  (1.676 m)  Wt 198 lb 3.2 oz (89.903 kg)  BMI 32.01 kg/m2  LMP 02/20/2016        Assessment & Plan:  1. Obesity (BMI 30-39.9)  2. Migraine without aura and without status migrainosus, not intractable  3. GAD (generalized anxiety disorder) -PT started on lexapro 10 mg today  -stress management discussed -RTO in 4 weeks - escitalopram (LEXAPRO) 10 MG tablet; Take 1 tablet (10 mg total) by mouth daily.  Dispense: 90 tablet; Refill: 3  I believe these intermittent headaches are related to anxiety. PT  states these headaches only last for a few mins, but occur daily.  PT to RTO in 4 weeks and will increase lexapro if needed. If headaches continue will try Topamax 25 mg BID.  Evelina Dun, FNP

## 2016-03-15 ENCOUNTER — Ambulatory Visit: Payer: Medicaid Other | Admitting: Nurse Practitioner

## 2016-03-18 ENCOUNTER — Encounter: Payer: Self-pay | Admitting: Family

## 2016-03-25 ENCOUNTER — Telehealth: Payer: Self-pay | Admitting: Nurse Practitioner

## 2016-03-25 MED ORDER — HYDROCORTISONE ACETATE 25 MG RE SUPP: 25.0000 mg | Freq: Two times a day (BID) | RECTAL | Status: DC

## 2016-03-25 NOTE — Telephone Encounter (Signed)
Prescription sent to pharmacy.

## 2016-03-25 NOTE — Telephone Encounter (Signed)
Patient aware rx sent to pharmacy.  

## 2016-04-04 ENCOUNTER — Ambulatory Visit (INDEPENDENT_AMBULATORY_CARE_PROVIDER_SITE_OTHER): Payer: Medicaid Other

## 2016-04-04 ENCOUNTER — Ambulatory Visit (INDEPENDENT_AMBULATORY_CARE_PROVIDER_SITE_OTHER): Payer: Medicaid Other | Admitting: Nurse Practitioner

## 2016-04-04 ENCOUNTER — Encounter: Payer: Self-pay | Admitting: Nurse Practitioner

## 2016-04-04 VITALS — BP 119/81 | HR 89 | Temp 97.5°F | Ht 66.0 in | Wt 202.0 lb

## 2016-04-04 DIAGNOSIS — M79644 Pain in right finger(s): Secondary | ICD-10-CM

## 2016-04-04 DIAGNOSIS — G8929 Other chronic pain: Secondary | ICD-10-CM

## 2016-04-04 DIAGNOSIS — F411 Generalized anxiety disorder: Secondary | ICD-10-CM | POA: Diagnosis not present

## 2016-04-04 DIAGNOSIS — R51 Headache: Secondary | ICD-10-CM

## 2016-04-04 DIAGNOSIS — J324 Chronic pansinusitis: Secondary | ICD-10-CM | POA: Diagnosis not present

## 2016-04-04 DIAGNOSIS — R519 Headache, unspecified: Secondary | ICD-10-CM

## 2016-04-04 MED ORDER — ESCITALOPRAM OXALATE 10 MG PO TABS: 10.0000 mg | ORAL_TABLET | Freq: Every day | ORAL | 3 refills | Status: AC

## 2016-04-04 NOTE — Progress Notes (Signed)
   Subjective:    Patient ID: Yesenia Gates, female    DOB: [birth year], 35 y.o.   MRN: [id number]  HPI PAtient comes in today with several complaints; -Headaches- Patient has been having headaches for over a year- she has been seen several times with c/o headaches- they think coming from her sinuses. They are occurring frequently- when she takes na antibiotic her head aches go away for several days. Headaches vary as to where they are- move from side to side. - anxiety- was started on lexapro but she thought that helped some with headache- sh estopped taking it while on last antibiotic. She feels more anxious since she stopped taking and would like to go back and take. GAD 7 : Generalized Anxiety Score 04/04/2016  Nervous, Anxious, on Edge 3  Control/stop worrying 3  Worry too much - different things 3  Trouble relaxing 2  Restless 1  Easily annoyed or irritable 2  Afraid - awful might happen 3  Total GAD 7 Score 17  Anxiety Difficulty Extremely difficult   - finger swollen-  Right middle finger swollen at base and painful to pend- says that she cannot bend it all the way. Denies any injury.   Review of Systems  Constitutional: Negative.   HENT: Negative for congestion.   Respiratory: Negative.   Cardiovascular: Negative.   Gastrointestinal: Negative.   Neurological: Positive for headaches. Negative for dizziness.  All other systems reviewed and are negative.      Objective:   Physical Exam  Constitutional: She is oriented to person, place, and time. She appears well-developed and well-nourished. No distress.  HENT:  Right Ear: External ear normal.  Left Ear: External ear normal.  Nose: Nose normal.  Mouth/Throat: Oropharynx is clear and moist.  Neck: Normal range of motion. Neck supple.  Cardiovascular: Normal rate and normal heart sounds.   Pulmonary/Chest: Effort normal and breath sounds normal.  Musculoskeletal:  Passive FROM of right index finger without pain-  mild swelling at base of finger.  Lymphadenopathy:    She has no cervical adenopathy.  Neurological: She is alert and oriented to person, place, and time. She has normal reflexes. No cranial nerve deficit.  Skin: Skin is warm.  Psychiatric: She has a normal mood and affect. Her behavior is normal. Judgment and thought content normal.   BP 119/81   Pulse 89   Temp 97.5 F (36.4 C) (Oral)   Ht 5\' 6"  (1.676 m)   Wt 202 lb (91.6 kg)   BMI 32.60 kg/m   Finger x ray- normal-Preliminary reading by Ronnald Collum, FNP  [facility]       Assessment & Plan:  1. Pain of finger of right hand Moist heat Rest motirn or tylenol OTC - DG Hand Complete Right; Future  2. Chronic pansinusitis - Ambulatory referral to ENT  3. Chronic nonintractable headache, unspecified headache type - Ambulatory referral to ENT  4. GAD (generalized anxiety disorder) Stress management - escitalopram (LEXAPRO) 10 MG tablet; Take 1 tablet (10 mg total) by mouth daily.  Dispense: 90 tablet; Refill: [place], FNP

## 2016-04-04 NOTE — Patient Instructions (Signed)
Stress and Stress Management Stress is a normal reaction to life events. It is what you feel when life demands more than you are used to or more than you can handle. Some stress can be useful. For example, the stress reaction can help you catch the last bus of the day, study for a test, or meet a deadline at work. But stress that occurs too often or for too long can cause problems. It can affect your emotional health and interfere with relationships and normal daily activities. Too much stress can weaken your immune system and increase your risk for physical illness. If you already have a medical problem, stress can make it worse. CAUSES  All sorts of life events may cause stress. An event that causes stress for one person may not be stressful for another person. Major life events commonly cause stress. These may be positive or negative. Examples include losing your job, moving into a new home, getting married, having a baby, or losing a loved one. Less obvious life events may also cause stress, especially if they occur day after day or in combination. Examples include working long hours, driving in traffic, caring for children, being in debt, or being in a difficult relationship. SIGNS AND SYMPTOMS Stress may cause emotional symptoms including, the following:  Anxiety. This is feeling worried, afraid, on edge, overwhelmed, or out of control.  Anger. This is feeling irritated or impatient.  Depression. This is feeling sad, down, helpless, or guilty.  Difficulty focusing, remembering, or making decisions. Stress may cause physical symptoms, including the following:   Aches and pains. These may affect your head, neck, back, stomach, or other areas of your body.  Tight muscles or clenched jaw.  Low energy or trouble sleeping. Stress may cause unhealthy behaviors, including the following:   Eating to feel better (overeating) or skipping meals.  Sleeping too little, too much, or both.  Working  too much or putting off tasks (procrastination).  Smoking, drinking alcohol, or using drugs to feel better. DIAGNOSIS  Stress is diagnosed through an assessment by your health care provider. Your health care provider will ask questions about your symptoms and any stressful life events.Your health care provider will also ask about your medical history and may order blood tests or other tests. Certain medical conditions and medicine can cause physical symptoms similar to stress. Mental illness can cause emotional symptoms and unhealthy behaviors similar to stress. Your health care provider may refer you to a mental health professional for further evaluation.  TREATMENT  Stress management is the recommended treatment for stress.The goals of stress management are reducing stressful life events and coping with stress in healthy ways.  Techniques for reducing stressful life events include the following:  Stress identification. Self-monitor for stress and identify what causes stress for you. These skills may help you to avoid some stressful events.  Time management. Set your priorities, keep a calendar of events, and learn to say "no." These tools can help you avoid making too many commitments. Techniques for coping with stress include the following:  Rethinking the problem. Try to think realistically about stressful events rather than ignoring them or overreacting. Try to find the positives in a stressful situation rather than focusing on the negatives.  Exercise. Physical exercise can release both physical and emotional tension. The key is to find a form of exercise you enjoy and do it regularly.  Relaxation techniques. These relax the body and mind. Examples include yoga, meditation, tai chi, biofeedback, deep  breathing, progressive muscle relaxation, listening to music, being out in nature, journaling, and other hobbies. Again, the key is to find one or more that you enjoy and can do  regularly.  Healthy lifestyle. Eat a balanced diet, get plenty of sleep, and do not smoke. Avoid using alcohol or drugs to relax.  Strong support network. Spend time with family, friends, or other people you enjoy being around.Express your feelings and talk things over with someone you trust. Counseling or talktherapy with a mental health professional may be helpful if you are having difficulty managing stress on your own. Medicine is typically not recommended for the treatment of stress.Talk to your health care provider if you think you need medicine for symptoms of stress. HOME CARE INSTRUCTIONS  Keep all follow-up visits as directed by your health care provider.  Take all medicines as directed by your health care provider. SEEK MEDICAL CARE IF:  Your symptoms get worse or you start having new symptoms.  You feel overwhelmed by your problems and can no longer manage them on your own. SEEK IMMEDIATE MEDICAL CARE IF:  You feel like hurting yourself or someone else.   This information is not intended to replace advice given to you by your health care provider. Make sure you discuss any questions you have with your health care provider.   Document Released: 02/19/2001 Document Revised: 09/16/2014 Document Reviewed: 04/20/2013 Elsevier Interactive Patient Education 2016 [employer]  

## 2016-04-09 ENCOUNTER — Telehealth: Payer: Self-pay | Admitting: Nurse Practitioner

## 2016-04-10 MED ORDER — FLUCONAZOLE 150 MG PO TABS: 150.0000 mg | ORAL_TABLET | Freq: Once | ORAL | 0 refills | Status: AC

## 2016-04-10 NOTE — Telephone Encounter (Signed)
Patient aware.

## 2016-04-10 NOTE — Telephone Encounter (Signed)
Covering PCP, Please advise.

## 2016-04-10 NOTE — Telephone Encounter (Signed)
Prescription sent to pharmacy.

## 2016-04-15 ENCOUNTER — Telehealth: Payer: Self-pay | Admitting: Nurse Practitioner

## 2016-04-15 NOTE — Telephone Encounter (Signed)
Pt took Diflucan on Thursday while Lexapro Had allergic reaction, rash on arms, chest and neck on Sat.  Went to ED.   Was advised to d/c Lexapro until Diflucan was out of system.  When can pt resume Lexapro?   Please review and advise

## 2016-04-15 NOTE — Telephone Encounter (Signed)
Patient aware.

## 2016-04-15 NOTE — Telephone Encounter (Signed)
Start lexapro in a week

## 2016-04-23 ENCOUNTER — Encounter: Payer: Self-pay | Admitting: *Deleted

## 2016-04-24 ENCOUNTER — Ambulatory Visit: Payer: Medicaid Other | Admitting: Diagnostic Neuroimaging

## 2016-04-25 ENCOUNTER — Encounter: Payer: Self-pay | Admitting: Diagnostic Neuroimaging

## 2016-04-26 ENCOUNTER — Ambulatory Visit: Payer: Medicaid Other | Admitting: Family

## 2016-04-29 ENCOUNTER — Encounter: Payer: Self-pay | Admitting: Family

## 2016-05-27 ENCOUNTER — Ambulatory Visit (INDEPENDENT_AMBULATORY_CARE_PROVIDER_SITE_OTHER): Payer: Medicaid Other | Admitting: Diagnostic Neuroimaging

## 2016-05-27 ENCOUNTER — Encounter: Payer: Self-pay | Admitting: Diagnostic Neuroimaging

## 2016-05-27 VITALS — BP 119/82 | HR 105 | Ht 66.5 in | Wt 196.8 lb

## 2016-05-27 DIAGNOSIS — G4486 Cervicogenic headache: Secondary | ICD-10-CM

## 2016-05-27 DIAGNOSIS — G4700 Insomnia, unspecified: Secondary | ICD-10-CM | POA: Diagnosis not present

## 2016-05-27 DIAGNOSIS — G44209 Tension-type headache, unspecified, not intractable: Secondary | ICD-10-CM | POA: Diagnosis not present

## 2016-05-27 DIAGNOSIS — R635 Abnormal weight gain: Secondary | ICD-10-CM | POA: Diagnosis not present

## 2016-05-27 DIAGNOSIS — F411 Generalized anxiety disorder: Secondary | ICD-10-CM

## 2016-05-27 DIAGNOSIS — G4485 Primary stabbing headache: Secondary | ICD-10-CM

## 2016-05-27 DIAGNOSIS — R51 Headache: Secondary | ICD-10-CM

## 2016-05-27 DIAGNOSIS — T50905A Adverse effect of unspecified drugs, medicaments and biological substances, initial encounter: Secondary | ICD-10-CM

## 2016-05-27 NOTE — Patient Instructions (Signed)

## 2016-05-27 NOTE — Progress Notes (Signed)
[facility]  PATIENT: Yesenia Gates DOB: [birth year]  REFERRING CLINICIAN: R Ramos HISTORY FROM: patient  REASON FOR VISIT: new consult    HISTORICAL  CHIEF COMPLAINT:  Chief Complaint  Patient presents with  . Headache    rm 6, New Pt, "HA > 1 year, Ibuprofen, Tylenol take it away but it comes back; waiting on ref to ENT"    HISTORY OF PRESENT ILLNESS:   35 year old female here for evaluation of headaches. Patient describes intermittent sharp pins and needles sensation in her head for the past one year. Symptoms can occur on a daily basis lasting for seconds at a time. They may occur throughout the day. This first started when she rose up from bed and felt the sensation on the top and right side of her head. Rarely this happened on the left side. No nausea or vomiting. Sometimes she has phonophobia with these episodes. Triggering factors include alcohol use. Patient averaging about 6 hours of sleep at night. Patient never had this problem before.  Patient also has depression, anxiety, racing thoughts, sleepiness, and has been on Lexapro for the past 4 months. Patient has also had 50 pound weight gain over the past 9 months.   REVIEW OF SYSTEMS: Full 14 system review of systems performed and negative with exception of: Joint pain aching muscles numbness weakness. Otherwise as per history of present illness.   ALLERGIES: Allergies  Allergen Reactions  . Penicillins Rash    HOME MEDICATIONS: Outpatient Medications Prior to Visit  Medication Sig Dispense Refill  . atorvastatin (LIPITOR) 40 MG tablet Take 1 tablet (40 mg total) by mouth daily. 90 tablet 0  . Cholecalciferol (VITAMIN D PO) Take by mouth.    . escitalopram (LEXAPRO) 10 MG tablet Take 1 tablet (10 mg total) by mouth daily. 90 tablet 3  . fluticasone (FLONASE) 50 MCG/ACT nasal spray Place 2 sprays into both nostrils daily. 16 g 6  . meloxicam (MOBIC) 15 MG tablet Take 1 tablet (15 mg  total) by mouth daily. 30 tablet 3  . hydrocortisone (ANUSOL-HC) 25 MG suppository Place 1 suppository (25 mg total) rectally 2 (two) times daily. 12 suppository 0  . medroxyPROGESTERone (DEPO-PROVERA) 150 MG/ML injection Inject 1 mL (150 mg total) into the muscle once. 1 mL 4   Facility-Administered Medications Prior to Visit  Medication Dose Route Frequency Provider Last Rate Last Dose  . medroxyPROGESTERone (DEPO-PROVERA) injection 150 mg  150 mg Intramuscular Q90 days Sharion Balloon, FNP   150 mg at 02/19/16 1522    PAST MEDICAL HISTORY: Past Medical History:  Diagnosis Date  . Abnormal Pap smear   . Anxiety   . Arthritis   . Chronic back pain   . DDD (degenerative disc disease), lumbar   . Headache   . Herniated disc   . Ovarian cyst   . Spondylisthesis     PAST SURGICAL HISTORY: Past Surgical History:  Procedure Laterality Date  . dilation and cutterage      FAMILY HISTORY: Family History  Problem Relation Age of Onset  . Diabetes Mother   . Cancer Mother     cervical  . Rheum arthritis Mother   . Diabetes Father   . Cancer Other   . Diabetes Sister   . Diabetes Brother   . Cancer Maternal Uncle     throat  . Stroke Maternal Uncle   . Cancer Maternal Grandmother     GYN  . Heart attack Maternal Grandfather   .  Cancer Cousin     cervical  . Cancer Maternal Uncle     throat  . Cancer Cousin     breast  . Cancer Cousin     cervical    SOCIAL HISTORY:  Social History   Social History  . Marital status: Single    Spouse name: N/A  . Number of children: 1  . Years of education: 65   Occupational History  .      unemployed   Social History Main Topics  . Smoking status: Current Every Day Smoker    Packs/day: 0.50    Types: Cigarettes  . Smokeless tobacco: Never Used  . Alcohol use Yes     Comment: occasionally  . Drug use: No  . Sexual activity: Not Currently    Birth control/ protection: Injection   Other Topics Concern  . Not on file    Social History Narrative   Lives at home with son, age 2   Caffeine use- use rarely     PHYSICAL EXAM  GENERAL EXAM/CONSTITUTIONAL: Vitals:  Vitals:   05/27/16 0935  BP: 119/82  Pulse: (!) 105  Weight: 196 lb 12.8 oz (89.3 kg)  Height: 5' 6.5" (1.689 m)     Body mass index is 31.29 kg/m.  Visual Acuity Screening   Right eye Left eye Both eyes  Without correction:     With correction: 20/20 20/20      Patient is in no distress; well developed, nourished and groomed; neck is supple  CARDIOVASCULAR:  Examination of carotid arteries is normal; no carotid bruits  Regular rate and rhythm, no murmurs  Examination of peripheral vascular system by observation and palpation is normal  EYES:  Ophthalmoscopic exam of optic discs and posterior segments is normal; no papilledema or hemorrhages  MUSCULOSKELETAL:  Gait, strength, tone, movements noted in Neurologic exam below  NEUROLOGIC: MENTAL STATUS:  No flowsheet data found.  awake, alert, oriented to person, place and time  recent and remote memory intact  normal attention and concentration  language fluent, comprehension intact, naming intact,   fund of knowledge appropriate  CRANIAL NERVE:   2nd - no papilledema on fundoscopic exam  2nd, 3rd, 4th, 6th - pupils equal and reactive to light, visual fields full to confrontation, extraocular muscles intact, no nystagmus  5th - facial sensation symmetric  7th - facial strength symmetric  8th - hearing intact  9th - palate elevates symmetrically, uvula midline  11th - shoulder shrug symmetric  12th - tongue protrusion midline  MOTOR:   normal bulk and tone, full strength in the BUE, BLE  SENSORY:   normal and symmetric to light touch, temperature, vibration  COORDINATION:   finger-nose-finger, fine finger movements normal  REFLEXES:   deep tendon reflexes present and symmetric  GAIT/STATION:   narrow based gait; able to walk tandem;  romberg is negative    DIAGNOSTIC DATA (LABS, IMAGING, TESTING) - I reviewed patient records, labs, notes, testing and imaging myself where available.  Lab Results  Component Value Date   WBC 7.6 12/08/2015   HGB 12.5 01/28/2013   HCT 37.7 12/08/2015   MCV 86 12/08/2015   PLT 226 12/08/2015      Component Value Date/Time   NA 142 12/08/2015 1449   K 3.8 12/08/2015 1449   CL 103 12/08/2015 1449   CO2 20 12/08/2015 1449   GLUCOSE 94 12/08/2015 1449   GLUCOSE 99 01/28/2013 0952   BUN 8 12/08/2015 1449   CREATININE 0.69  12/08/2015 1449   CALCIUM 9.3 12/08/2015 1449   PROT 6.6 12/08/2015 1449   ALBUMIN 4.5 12/08/2015 1449   AST 9 12/08/2015 1449   ALT 14 12/08/2015 1449   ALKPHOS 97 12/08/2015 1449   BILITOT 0.3 12/08/2015 1449   GFRNONAA 112 12/08/2015 1449   GFRAA 129 12/08/2015 1449   Lab Results  Component Value Date   CHOL 105 12/08/2015   HDL 35 (L) 12/08/2015   LDLCALC 51 12/08/2015   TRIG 94 12/08/2015   CHOLHDL 3.0 12/08/2015   No results found for: HGBA1C Lab Results  Component Value Date   VITAMINB12 362 12/08/2015   Lab Results  Component Value Date   TSH 1.370 05/20/2014    July 2017 MRI brain [facility], [place]; [place] [facility]): normal per patient     ASSESSMENT AND PLAN  35 y.o. year old female here with New onset pain and headache for the past one year. Neurologic examination and MRI of the brain (apparently normal per patient) are unremarkable. I reassured patient and advised to start treatment of her headache problem conservatively with changes in her nutrition, exercise, and relaxation techniques.   Dx:   1. Tension headache   2. Primary stabbing headache   3. Cervicogenic headache   4. Anxiety state   5. Insomnia   6. Weight gain due to medication     PLAN: - advised on nutrition, exercise, weight loss strategies - agree with PT evaluation - may consider topiramate in future for headache and weight loss control  Return in  about 3 months (around 08/26/2016).    Penni Bombard, MD [id number], [id number] AM Certified in Neurology, Neurophysiology and Neuroimaging  [facility] [address], [place], [place] [address] [phone]

## 2016-08-27 ENCOUNTER — Ambulatory Visit: Payer: Medicaid Other | Admitting: Diagnostic Neuroimaging

## 2016-08-28 ENCOUNTER — Encounter: Payer: Self-pay | Admitting: Diagnostic Neuroimaging

## 2016-12-12 ENCOUNTER — Other Ambulatory Visit (HOSPITAL_COMMUNITY): Payer: Self-pay | Admitting: Physician Assistant

## 2016-12-12 DIAGNOSIS — N6320 Unspecified lump in the left breast, unspecified quadrant: Secondary | ICD-10-CM

## 2016-12-24 ENCOUNTER — Ambulatory Visit (HOSPITAL_COMMUNITY)
Admission: RE | Admit: 2016-12-24 | Discharge: 2016-12-24 | Disposition: A | Discharge status: Home / Self Care | Payer: Medicaid Other | Source: Ambulatory Visit | Attending: Physician Assistant | Admitting: Physician Assistant

## 2016-12-24 DIAGNOSIS — N6082 Other benign mammary dysplasias of left breast: Secondary | ICD-10-CM | POA: Insufficient documentation

## 2016-12-24 DIAGNOSIS — N6320 Unspecified lump in the left breast, unspecified quadrant: Secondary | ICD-10-CM | POA: Diagnosis present

## 2017-01-07 ENCOUNTER — Encounter (HOSPITAL_COMMUNITY): Payer: Medicaid Other

## 2017-02-14 ENCOUNTER — Institutional Professional Consult (permissible substitution): Payer: Medicaid Other | Admitting: Diagnostic Neuroimaging

## 2017-03-07 ENCOUNTER — Ambulatory Visit (INDEPENDENT_AMBULATORY_CARE_PROVIDER_SITE_OTHER): Payer: Medicaid Other | Admitting: Diagnostic Neuroimaging

## 2017-03-07 ENCOUNTER — Encounter: Payer: Self-pay | Admitting: Diagnostic Neuroimaging

## 2017-03-07 VITALS — BP 112/72 | HR 74 | Wt 201.4 lb

## 2017-03-07 DIAGNOSIS — F411 Generalized anxiety disorder: Secondary | ICD-10-CM

## 2017-03-07 DIAGNOSIS — G44209 Tension-type headache, unspecified, not intractable: Secondary | ICD-10-CM

## 2017-03-07 DIAGNOSIS — G4486 Cervicogenic headache: Secondary | ICD-10-CM

## 2017-03-07 DIAGNOSIS — R51 Headache: Secondary | ICD-10-CM

## 2017-03-07 DIAGNOSIS — G4700 Insomnia, unspecified: Secondary | ICD-10-CM

## 2017-03-07 DIAGNOSIS — H4710 Unspecified papilledema: Secondary | ICD-10-CM | POA: Diagnosis not present

## 2017-03-07 MED ORDER — TOPIRAMATE 50 MG PO TABS: 50.0000 mg | ORAL_TABLET | Freq: Two times a day (BID) | ORAL | 6 refills | Status: DC

## 2017-03-07 NOTE — Progress Notes (Signed)
[facility]  PATIENT: Yesenia Gates DOB: 1981/02/17  REFERRING CLINICIAN: Anthony Sar, OD HISTORY FROM: patient  REASON FOR VISIT: follow up    HISTORICAL  CHIEF COMPLAINT:  Chief Complaint  Patient presents with  . Optic disc edema, both eyes    rm 7, seen 2017 for headache, " headache from top of right side of head to temple and right eye"    HISTORY OF PRESENT ILLNESS:   UPDATE 03/07/17: Since last visit, HAs continued. 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 Now referred here for evaluation. No tinnitus. Some right TMJ pressure. Daily HA (lasting minutes at a time). No transient visual obscurations. No snoring. Has insomnia.   PRIOR HPI (05/27/16): 36 year old female here for evaluation of headaches. Patient describes intermittent sharp pins and needles sensation in her head for the past one year. Symptoms can occur on a daily basis lasting for seconds at a time. They may occur throughout the day. This first started when she rose up from bed and felt the sensation on the top and right side of her head. Rarely this happened on the left side. No nausea or vomiting. Sometimes she has phonophobia with these episodes. Triggering factors include alcohol use. Patient averaging about 6 hours of sleep at night. Patient never had this problem before. Patient also has depression, anxiety, racing thoughts, sleepiness, and has been on Lexapro for the past 4 months. Patient has also had 50 pound weight gain over the past 9 months.   REVIEW OF SYSTEMS: Full 14 system review of systems performed and negative with exception of: headaches depression anxiety racing throughts eye pain.    ALLERGIES: Allergies  Allergen Reactions  . Penicillins Rash    HOME MEDICATIONS: Outpatient Medications Prior to Visit  Medication Sig Dispense Refill  . atorvastatin (LIPITOR) 40 MG tablet Take 1 tablet (40 mg total) by mouth  daily. 90 tablet 0  . Cholecalciferol (VITAMIN D PO) Take by mouth.    . escitalopram (LEXAPRO) 10 MG tablet Take 1 tablet (10 mg total) by mouth daily. 90 tablet 3  . fluticasone (FLONASE) 50 MCG/ACT nasal spray Place 2 sprays into both nostrils daily. 16 g 6  . meloxicam (MOBIC) 15 MG tablet Take 1 tablet (15 mg total) by mouth daily. 30 tablet 3   No facility-administered medications prior to visit.     PAST MEDICAL HISTORY: Past Medical History:  Diagnosis Date  . Abnormal Pap smear   . Anxiety   . Arthritis   . Chronic back pain   . DDD (degenerative disc disease), lumbar   . Headache   . Herniated disc   . Ovarian cyst   . Spondylisthesis     PAST SURGICAL HISTORY: Past Surgical History:  Procedure Laterality Date  . dilation and cutterage      FAMILY HISTORY: Family History  Problem Relation Age of Onset  . Diabetes Mother   . Cancer Mother        cervical  . Rheum arthritis Mother   . Diabetes Father   . Cancer Other   . Diabetes Sister   . Diabetes Brother   . Cancer Maternal Uncle        throat  . Stroke Maternal Uncle   . Cancer Maternal Grandmother        GYN  . Heart attack Maternal Grandfather   . Cancer Cousin  cervical  . Cancer Maternal Uncle        throat  . Cancer Cousin        breast  . Cancer Cousin        cervical    SOCIAL HISTORY:  Social History   Social History  . Marital status: Single    Spouse name: N/A  . Number of children: 1  . Years of education: 34   Occupational History  .      [name]    Social History Main Topics  . Smoking status: Current Every Day Smoker    Packs/day: 0.50    Types: Cigarettes  . Smokeless tobacco: Never Used  . Alcohol use Yes     Comment: occasionally  . Drug use: No  . Sexual activity: Not Currently    Birth control/ protection: Injection   Other Topics Concern  . Not on file   Social History Narrative   Lives at home with son, age 3   Caffeine use- use rarely      PHYSICAL EXAM  GENERAL EXAM/CONSTITUTIONAL: Vitals:  Vitals:   03/07/17 0903  BP: 112/72  Pulse: 74  Weight: 201 lb 6.4 oz (91.4 kg)   Wt Readings from Last 3 Encounters:  03/07/17 201 lb 6.4 oz (91.4 kg)  05/27/16 196 lb 12.8 oz (89.3 kg)  04/04/16 202 lb (91.6 kg)   Body mass index is 32.02 kg/m.  Visual Acuity Screening   Right eye Left eye Both eyes  Without correction:     With correction: 20/20 20/20     Patient is in no distress; well developed, nourished and groomed; neck is supple  CARDIOVASCULAR:  Examination of carotid arteries is normal; no carotid bruits  Regular rate and rhythm, no murmurs  Examination of peripheral vascular system by observation and palpation is normal  EYES:  Ophthalmoscopic exam of optic discs and posterior segments is NOTABLE FOR SUBTLE BLURRED DISC MARGINS  MUSCULOSKELETAL:  Gait, strength, tone, movements noted in Neurologic exam below  NEUROLOGIC: MENTAL STATUS:  No flowsheet data found.  awake, alert, oriented to person, place and time  recent and remote memory intact  normal attention and concentration  language fluent, comprehension intact, naming intact,   fund of knowledge appropriate  CRANIAL NERVE:   2nd - SUBTLE BLURRED DISC MARGINS  2nd, 3rd, 4th, 6th - pupils equal and reactive to light, visual fields full to confrontation, extraocular muscles intact, no nystagmus  5th - facial sensation symmetric  7th - facial strength symmetric  8th - hearing intact  9th - palate elevates symmetrically, uvula midline  11th - shoulder shrug symmetric  12th - tongue protrusion midline  MOTOR:   normal bulk and tone, full strength in the BUE, BLE  SENSORY:   normal and symmetric to light touch, temperature, vibration  COORDINATION:   finger-nose-finger, fine finger movements normal  REFLEXES:   deep tendon reflexes present and symmetric  GAIT/STATION:   narrow based gait    DIAGNOSTIC  DATA (LABS, IMAGING, TESTING) - I reviewed patient records, labs, notes, testing and imaging myself where available.  Lab Results  Component Value Date   WBC 7.6 12/08/2015   HGB 12.8 12/08/2015   HCT 37.7 12/08/2015   MCV 86 12/08/2015   PLT 226 12/08/2015      Component Value Date/Time   NA 142 12/08/2015 1449   K 3.8 12/08/2015 1449   CL 103 12/08/2015 1449   CO2 20 12/08/2015 1449   GLUCOSE 94 12/08/2015  1449   GLUCOSE 99 01/28/2013 0952   BUN 8 12/08/2015 1449   CREATININE 0.69 12/08/2015 1449   CALCIUM 9.3 12/08/2015 1449   PROT 6.6 12/08/2015 1449   ALBUMIN 4.5 12/08/2015 1449   AST 9 12/08/2015 1449   ALT 14 12/08/2015 1449   ALKPHOS 97 12/08/2015 1449   BILITOT 0.3 12/08/2015 1449   GFRNONAA 112 12/08/2015 1449   GFRAA 129 12/08/2015 1449   Lab Results  Component Value Date   CHOL 105 12/08/2015   HDL 35 (L) 12/08/2015   LDLCALC 51 12/08/2015   TRIG 94 12/08/2015   CHOLHDL 3.0 12/08/2015   No results found for: HGBA1C Lab Results  Component Value Date   VITAMINB12 362 12/08/2015   Lab Results  Component Value Date   TSH 1.370 05/20/2014    July 2017 MRI brain [place], [place]; [facility]): normal per patient report     ASSESSMENT AND PLAN  36 y.o. year old female here with new onset pain and headache since 2016. Had normal MRI in 2017. 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 Symptoms are new since last MRI, therefore patient needs further workup.   Dx:   1. 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 Tension headache   3. Cervicogenic headache   4. Anxiety state   5. Insomnia, unspecified type      PLAN: - check MRI brain (eval for mass, stroke, inflammation) - then check LP (to measure openign pressure) - start topiramate 50mg  at bedtime x 2 weeks; then increase to twice a day; if LP shows elevated pressure, then may switch to acetazolamide - advised on nutrition, exercise, weight loss  strategies  Orders Placed This Encounter  Procedures  . MR BRAIN W WO CONTRAST   Meds ordered this encounter  Medications  . topiramate (TOPAMAX) 50 MG tablet    Sig: Take 1 tablet (50 mg total) by mouth 2 (two) times daily.    Dispense:  60 tablet    Refill:  6   Return in about 3 months (around 06/07/2017).    Penni Bombard, MD 8/41/3244, 0:10 AM Certified in Neurology, Neurophysiology and Neuroimaging  [facility] [address] [place], [place] [address] [phone]

## 2017-03-07 NOTE — Patient Instructions (Signed)
-   check MRI brain   - then may check lumbar puncture  - start topiramate 50mg  at bedtime x 2 weeks; then increase to twice a day;

## 2017-03-16 ENCOUNTER — Emergency Department (HOSPITAL_COMMUNITY)
Admission: EM | Admit: 2017-03-16 | Discharge: 2017-03-16 | Disposition: A | Discharge status: Home / Self Care | Payer: Medicaid Other | Attending: Emergency Medicine | Admitting: Emergency Medicine

## 2017-03-16 ENCOUNTER — Encounter (HOSPITAL_COMMUNITY): Payer: Self-pay | Admitting: Adult Health

## 2017-03-16 ENCOUNTER — Emergency Department (HOSPITAL_COMMUNITY): Payer: Medicaid Other

## 2017-03-16 DIAGNOSIS — F17210 Nicotine dependence, cigarettes, uncomplicated: Secondary | ICD-10-CM | POA: Insufficient documentation

## 2017-03-16 DIAGNOSIS — H9201 Otalgia, right ear: Secondary | ICD-10-CM | POA: Diagnosis present

## 2017-03-16 DIAGNOSIS — K0889 Other specified disorders of teeth and supporting structures: Secondary | ICD-10-CM | POA: Insufficient documentation

## 2017-03-16 DIAGNOSIS — R51 Headache: Secondary | ICD-10-CM | POA: Insufficient documentation

## 2017-03-16 DIAGNOSIS — Z791 Long term (current) use of non-steroidal anti-inflammatories (NSAID): Secondary | ICD-10-CM | POA: Diagnosis not present

## 2017-03-16 DIAGNOSIS — Z79899 Other long term (current) drug therapy: Secondary | ICD-10-CM | POA: Diagnosis not present

## 2017-03-16 MED ORDER — DICLOFENAC SODIUM 75 MG PO TBEC: 75.0000 mg | DELAYED_RELEASE_TABLET | Freq: Two times a day (BID) | ORAL | 0 refills | Status: DC

## 2017-03-16 MED ORDER — CLINDAMYCIN HCL 150 MG PO CAPS: 300.0000 mg | ORAL_CAPSULE | Freq: Four times a day (QID) | ORAL | 0 refills | Status: DC

## 2017-03-16 MED ORDER — HYDROCODONE-ACETAMINOPHEN 5-325 MG PO TABS
1.0000 | ORAL_TABLET | Freq: Once | ORAL | Status: AC
  Administered 2017-03-16: 1 via ORAL
  Filled 2017-03-16: qty 1

## 2017-03-16 NOTE — ED Provider Notes (Signed)
[facility] DEPT Provider Note   CSN: [id number] Arrival date & time: 03/16/17  1420     History   Chief Complaint Chief Complaint  Patient presents with  . Otalgia    HPI Yesenia Gates is a 36 y.o. female.  HPI   Yesenia Gates is a 36 y.o. female who presents to the Emergency Department complaining of right ear pain.  She also complains of headaches are chronic and have been waxing and waning for one year.  She has seen a neurologist and ophthalmologist for this and told that she has "swelling of her right optic nerve" she was prescribed Topamax which she has not taken.  She comes to the ER requesting evaluation of the ear pain.  She states she developed a sharp stabbing pain to the right ear last evening that has been constant.  She denies recent illness, dizziness, visual changes, fever, nasal or sinus congestion or drainage from her ear.  She also admits to having right upper dental pain for few days.  She is concerned that her ear pain maybe related to her optic nerve pain.   Past Medical History:  Diagnosis Date  . Abnormal Pap smear   . Anxiety   . Arthritis   . Chronic back pain   . DDD (degenerative disc disease), lumbar   . Headache   . Herniated disc   . Ovarian cyst   . Spondylisthesis     Patient Active Problem List   Diagnosis Date Noted  . Obesity (BMI 30-39.9) 02/20/2016  . Allergic rhinitis 07/17/2015  . Current smoker 07/17/2015  . Vitamin D deficiency 11/29/2014  . Hyperlipidemia 11/29/2014    Past Surgical History:  Procedure Laterality Date  . dilation and cutterage      OB History    Gravida Para Term Preterm AB Living   2 1 1          SAB TAB Ectopic Multiple Live Births                   Home Medications    Prior to Admission medications   Medication Sig Start Date End Date Taking? Authorizing Provider  atorvastatin (LIPITOR) 40 MG tablet Take 1 tablet (40 mg total) by mouth daily. 07/17/15   Evelina Dun A, FNP    Cholecalciferol (VITAMIN D PO) Take by mouth.    [provider]  escitalopram (LEXAPRO) 10 MG tablet Take 1 tablet (10 mg total) by mouth daily. 04/04/16   Hassell Done, Mary-Margaret, FNP  fluticasone (FLONASE) 50 MCG/ACT nasal spray Place 2 sprays into both nostrils daily. 11/17/15   Evelina Dun A, FNP  LORazepam (ATIVAN) 0.5 MG tablet Take 0.5 mg by mouth at bedtime.    [provider]  medroxyPROGESTERone (DEPO-PROVERA) 150 MG/ML injection Inject into the muscle. 12/30/16   [provider]  meloxicam (MOBIC) 15 MG tablet Take 1 tablet (15 mg total) by mouth daily. 09/21/15   Hassell Done, Mary-Margaret, FNP  topiramate (TOPAMAX) 50 MG tablet Take 1 tablet (50 mg total) by mouth 2 (two) times daily. 03/07/17   Penumalli, Earlean Polka, MD    Family History Family History  Problem Relation Age of Onset  . Diabetes Mother   . Cancer Mother        cervical  . Rheum arthritis Mother   . Diabetes Father   . Cancer Other   . Diabetes Sister   . Diabetes Brother   . Cancer Maternal Uncle  throat  . Stroke Maternal Uncle   . Cancer Maternal Grandmother        GYN  . Heart attack Maternal Grandfather   . Cancer Cousin        cervical  . Cancer Maternal Uncle        throat  . Cancer Cousin        breast  . Cancer Cousin        cervical    Social History Social History  Substance Use Topics  . Smoking status: Current Every Day Smoker    Packs/day: 0.50    Types: Cigarettes  . Smokeless tobacco: Never Used  . Alcohol use Yes     Comment: occasionally     Allergies   Penicillins   Review of Systems Review of Systems  Constitutional: Negative for chills and fever.  HENT: Positive for dental problem and ear pain. Negative for congestion, rhinorrhea, sinus pain, sore throat and trouble swallowing.   Cardiovascular: Negative for chest pain.  Gastrointestinal: Negative for abdominal pain, nausea and vomiting.  Musculoskeletal: Negative for neck pain and  neck stiffness.  Skin: Negative for rash.  Neurological: Positive for headaches. Negative for dizziness, syncope and weakness.     Physical Exam Updated Vital Signs BP 131/84   Pulse 96   Temp 98.4 F (36.9 C) (Oral)   Resp 18   Wt 91.3 kg (201 lb 6 oz)   SpO2 100%   BMI 32.02 kg/m   Physical Exam  Constitutional: She is oriented to person, place, and time. She appears well-developed and well-nourished. No distress.  HENT:  Head: Normocephalic and atraumatic.  Right Ear: Tympanic membrane and ear canal normal.  Left Ear: Tympanic membrane and ear canal normal.  Mouth/Throat: Uvula is midline, oropharynx is clear and moist and mucous membranes are normal. No trismus in the jaw. No dental abscesses, uvula swelling or dental caries.    Tenderness to palpation of the right upper second molar and surrounding gingiva.  No facial swelling, obvious dental abscess, trismus, or sublingual abnml.    Eyes: Conjunctivae and EOM are normal. Pupils are equal, round, and reactive to light.  Neck: Normal range of motion and phonation normal. Neck supple. No Kernig's sign noted.  Cardiovascular: Normal rate, regular rhythm and normal heart sounds.   No murmur heard. Pulmonary/Chest: Effort normal and breath sounds normal.  Musculoskeletal: Normal range of motion.  Lymphadenopathy:    She has no cervical adenopathy.  Neurological: She is alert and oriented to person, place, and time. No sensory deficit. She exhibits normal muscle tone. Coordination normal.  Skin: Skin is warm and dry.  Psychiatric: She has a normal mood and affect.  Nursing note and vitals reviewed.    ED Treatments / Results  Labs (all labs ordered are listed, but only abnormal results are displayed) Labs Reviewed - No data to display  EKG  EKG Interpretation None       Radiology Ct Head Wo Contrast  Result Date: 03/16/2017 CLINICAL DATA:  Optic neuritis and ear pain radiating to jaw and RIGHT head. EXAM: CT  HEAD WITHOUT CONTRAST TECHNIQUE: Contiguous axial images were obtained from the base of the skull through the vertex without intravenous contrast. COMPARISON:  CT HEAD September 25, 2010 FINDINGS: BRAIN: No intraparenchymal hemorrhage, mass effect nor midline shift. The ventricles and sulci are normal. No acute large vascular territory infarcts. No abnormal extra-axial fluid collections. Basal cisterns are patent. VASCULAR: Unremarkable. SKULL/SOFT TISSUES: No skull fracture. No significant soft  tissue swelling. ORBITS/SINUSES: The included ocular globes and orbital contents are normal.The mastoid aircells, middle ears and included paranasal sinuses are well-aerated. OTHER: None. IMPRESSION: Normal noncontrast CT HEAD. Electronically Signed   By: Elon Alas M.D.   On: 03/16/2017 16:47    Procedures Procedures (including critical care time)  Medications Ordered in ED Medications - No data to display   Initial Impression / Assessment and Plan / ED Course  I have reviewed the triage vital signs and the nursing notes.  Pertinent labs & imaging results that were available during my care of the patient were reviewed by me and considered in my medical decision making (see chart for details).     Pt is well appearing.  No focal neuro deficits on exam.  Vitals stable.  Right TM appear nml.  No meningeal signs.  No visual changes, Pt here for right ear pain, which is felt to be related to her dental pain.  Will tx with NSAID and abx,return precautions discussed.   Final Clinical Impressions(s) / ED Diagnoses   Final diagnoses:  Otalgia of right ear  Pain, dental    New Prescriptions New Prescriptions   No medications on file     Kem Parkinson, Hershal Coria 03/16/17 1714    Dorie Rank, MD 03/18/17 [phone]

## 2017-03-16 NOTE — ED Triage Notes (Signed)
Hx of inflamed optical nerve per opthalmologist, sent to neuro doctor and had a consult about a week ago for same, they asked her if she was having any ear pain and at the time she denied. Today began experiencing ear pain with radiation into the jaw and temporal side of head on the right side. Denies blurry vision, feels like "My ear is stopped up and it feels like I am going up high in an airplane how your ears get" Pain is described as sharp.

## 2017-03-16 NOTE — Discharge Instructions (Signed)
Call your neurologist to arrange a follow-up appt.  You can also contact one of the dentists on the list provided.

## 2017-03-19 ENCOUNTER — Telehealth: Payer: Self-pay | Admitting: Diagnostic Neuroimaging

## 2017-03-19 NOTE — Telephone Encounter (Signed)
Pt calling to inform that on Sunday the 8th she had a CAT scan done at [facility] and wants to know if Dr Leta Baptist can view that to determine if she still needs the MRI that has been scheduled, please call.

## 2017-03-20 NOTE — Telephone Encounter (Signed)
Spoke to pt and relayed that Dr. Leta Baptist wanted her to continue with plan of having the MRI.  She is due to have on 03/23/17.  She verbalized understanding.

## 2017-03-20 NOTE — Telephone Encounter (Signed)
Yes. Still needs MRI. -VRP

## 2017-03-23 ENCOUNTER — Ambulatory Visit
Admission: RE | Admit: 2017-03-23 | Discharge: 2017-03-23 | Disposition: A | Discharge status: Home / Self Care | Payer: Medicaid Other | Source: Ambulatory Visit | Attending: Diagnostic Neuroimaging | Admitting: Diagnostic Neuroimaging

## 2017-03-23 DIAGNOSIS — G44209 Tension-type headache, unspecified, not intractable: Secondary | ICD-10-CM

## 2017-03-23 DIAGNOSIS — H4710 Unspecified papilledema: Secondary | ICD-10-CM | POA: Diagnosis not present

## 2017-03-23 MED ORDER — GADOBENATE DIMEGLUMINE 529 MG/ML IV SOLN
19.0000 mL | Freq: Once | INTRAVENOUS | Status: AC | PRN
  Administered 2017-03-23: 19 mL via INTRAVENOUS

## 2017-03-26 ENCOUNTER — Telehealth: Payer: Self-pay | Admitting: *Deleted

## 2017-03-26 DIAGNOSIS — R519 Headache, unspecified: Secondary | ICD-10-CM

## 2017-03-26 DIAGNOSIS — R51 Headache: Principal | ICD-10-CM

## 2017-03-26 NOTE — Telephone Encounter (Signed)
-----   Message from Penni Bombard, MD sent at 03/25/2017  6:38 PM EDT ----- Unremarkable imaging results. Please call patient. Continue current plan. -VRP

## 2017-03-26 NOTE — Addendum Note (Signed)
Addended by: Andrey Spearman R on: 03/26/2017 04:54 PM   Modules accepted: Orders

## 2017-03-26 NOTE — Telephone Encounter (Signed)
Spoke to pt and relayed that her MRI results were unremarkable (normal per report).  She asked about cause of her headaches.  She is finishing her cleocin today.  No change in her headaches.  She had not started her topamax as yet.  I will check on order for spinal tap.  Can have a normal MRI brain scan and have elevated pressure in CSF.  (called [facility]) and if after spinal tap and has increased pressure reading, will possibly change medication to another.  She verbalized understanding.

## 2017-03-26 NOTE — Telephone Encounter (Signed)
Will order LP. -VRP

## 2017-04-08 ENCOUNTER — Inpatient Hospital Stay
Admission: RE | Admit: 2017-04-08 | Discharge: 2017-04-08 | Disposition: A | Discharge status: Home / Self Care | Payer: Medicaid Other | Source: Ambulatory Visit | Attending: Diagnostic Neuroimaging | Admitting: Diagnostic Neuroimaging

## 2017-04-08 NOTE — Discharge Instructions (Signed)

## 2017-04-16 ENCOUNTER — Telehealth: Payer: Self-pay | Admitting: *Deleted

## 2017-04-16 ENCOUNTER — Ambulatory Visit
Admission: RE | Admit: 2017-04-16 | Discharge: 2017-04-16 | Disposition: A | Discharge status: Home / Self Care | Payer: Medicaid Other | Source: Ambulatory Visit | Attending: Diagnostic Neuroimaging | Admitting: Diagnostic Neuroimaging

## 2017-04-16 VITALS — BP 124/72 | HR 91

## 2017-04-16 DIAGNOSIS — E669 Obesity, unspecified: Secondary | ICD-10-CM

## 2017-04-16 DIAGNOSIS — R51 Headache: Secondary | ICD-10-CM

## 2017-04-16 DIAGNOSIS — R519 Headache, unspecified: Secondary | ICD-10-CM

## 2017-04-16 LAB — CSF CELL COUNT WITH DIFFERENTIAL
RBC Count, CSF: 3 cells/uL (ref 0–10)
WBC, CSF: 0 cells/uL (ref 0–5)

## 2017-04-16 LAB — GLUCOSE, CSF: Glucose, CSF: 63 mg/dL (ref 43–76)

## 2017-04-16 LAB — PROTEIN, CSF: TOTAL PROTEIN, CSF: 37 mg/dL (ref 15–45)

## 2017-04-16 NOTE — Discharge Instructions (Signed)
Lumbar Puncture Discharge Instructions  1. Go home and rest quietly for the next 24 hours.  It is important to lie flat for the next 24 hours.  Get up only to go to the restroom.  You may lie in the bed or on a couch on your back, your stomach, your left side or your right side.  You may have one pillow under your head.  You may have pillows between your knees while you are on your side or under your knees while you are on your back.  2. DO NOT drive today.  Recline the seat as far back as it will go, while still wearing your seat belt, on the way home.  3. You may get up to go to the bathroom as needed.  You may sit up for 10 minutes to eat.  You may resume your normal diet and medications unless otherwise indicated.  Drink lots of extra fluids today and tomorrow.  4. The incidence of headache, nausea, or vomiting is about 5% (one in 20 patients).  If you develop a headache, lie flat and drink plenty of fluids until the headache goes away.  Caffeinated beverages may be helpful.  If you develop severe nausea and vomiting or a headache that does not go away with flat bed rest, call the physician who sent you here.   5. You may resume normal activities after your 24 hours of bed rest is over; however, do not exert yourself strongly or do any heavy lifting tomorrow.  6. Call your physician for a follow-up appointment.   7. If you have any questions  after you arrive home, please call [phone].  Discharge instructions have been explained to the patient.  The patient, or the person responsible for the patient, fully understands these instructions.  Lumbar Puncture Discharge Instructions  8. Go home and rest quietly for the next 24 hours.  It is important to lie flat for the next 24 hours.  Get up only to go to the restroom.  You may lie in the bed or on a couch on your back, your stomach, your left side or your right side.  You may have one pillow under your head.  You may have pillows between  your knees while you are on your side or under your knees while you are on your back.  9. DO NOT drive today.  Recline the seat as far back as it will go, while still wearing your seat belt, on the way home.  10. You may get up to go to the bathroom as needed.  You may sit up for 10 minutes to eat.  You may resume your normal diet and medications unless otherwise indicated.  Drink lots of extra fluids today and tomorrow.  11. The incidence of headache, nausea, or vomiting is about 5% (one in 20 patients).  If you develop a headache, lie flat and drink plenty of fluids until the headache goes away.  Caffeinated beverages may be helpful.  If you develop severe nausea and vomiting or a headache that does not go away with flat bed rest, call the physician who sent you here.   12. You may resume normal activities after your 24 hours of bed rest is over; however, do not exert yourself strongly or do any heavy lifting tomorrow.  13. Call your physician for a follow-up appointment.   14. If you have any questions  after you arrive home, please call [phone].  Discharge instructions have been  explained to the patient.  The patient, or the person responsible for the patient, fully understands these instructions.

## 2017-04-16 NOTE — Telephone Encounter (Signed)
Received call from  [place] at [facility] labs. She stated she has lab results from [facility]. She will fax directly to this RN.   Received lab results. Placed on Dr [employer] desk for his review tomorrow when he returns to the office.

## 2017-04-17 ENCOUNTER — Telehealth: Payer: Self-pay | Admitting: *Deleted

## 2017-04-17 NOTE — Telephone Encounter (Signed)
Spoke with patient and informed her that her LP opening pressure was normal, and her labs were okay. Advised Dr Leta Baptist will continue with his current plan, will not change medications. Patient stated she stopped taking Topiramate; she thought she wasn't supposed to take it until she got MRI, LP results. Inquired if she still has headache; she stated she has a headache on the right side.  Advised her she may begin taking Topamax at night. She may take Tylenol or Ibuprofen as needed, drink plenty of water and rest. She state she has been drinking and resting. She verbalized understanding, appreciation of call.

## 2017-04-20 LAB — CSF CULTURE W GRAM STAIN
Gram Stain: NONE SEEN
Gram Stain: NONE SEEN
Organism ID, Bacteria: NO GROWTH

## 2017-04-20 LAB — CSF CULTURE

## 2017-06-18 ENCOUNTER — Ambulatory Visit: Payer: Medicaid Other | Admitting: Diagnostic Neuroimaging

## 2017-06-19 ENCOUNTER — Encounter: Payer: Self-pay | Admitting: Diagnostic Neuroimaging

## 2017-09-02 ENCOUNTER — Encounter (HOSPITAL_COMMUNITY): Payer: Self-pay | Admitting: Emergency Medicine

## 2017-09-02 ENCOUNTER — Emergency Department (HOSPITAL_COMMUNITY): Payer: Medicaid Other

## 2017-09-02 ENCOUNTER — Emergency Department (HOSPITAL_COMMUNITY)
Admission: EM | Admit: 2017-09-02 | Discharge: 2017-09-02 | Disposition: A | Discharge status: Home / Self Care | Payer: Medicaid Other | Attending: Emergency Medicine | Admitting: Emergency Medicine

## 2017-09-02 DIAGNOSIS — F17210 Nicotine dependence, cigarettes, uncomplicated: Secondary | ICD-10-CM | POA: Diagnosis not present

## 2017-09-02 DIAGNOSIS — R202 Paresthesia of skin: Secondary | ICD-10-CM | POA: Diagnosis present

## 2017-09-02 DIAGNOSIS — R448 Other symptoms and signs involving general sensations and perceptions: Secondary | ICD-10-CM | POA: Diagnosis not present

## 2017-09-02 MED ORDER — LORAZEPAM 1 MG PO TABS
1.0000 mg | ORAL_TABLET | Freq: Once | ORAL | Status: AC
  Administered 2017-09-02: 1 mg via ORAL
  Filled 2017-09-02: qty 1

## 2017-09-02 NOTE — ED Notes (Signed)
Pt complaining of right arm heaviness. Pt states she does have panic attacks. Pt is able to move all extremities equally and does have feeling in each.

## 2017-09-02 NOTE — ED Triage Notes (Signed)
Patient brought in by EMS complaining of tingling to tongue starting at 0800 today continuing throughout the day and right arm "heaviness" at 1630. Patient states she has history of anxiety and panic attacks.

## 2017-09-02 NOTE — Discharge Instructions (Signed)
Your testing showed no signs of any problems with your lungs - it does not explain why you have been coughing.  You have likely had some underlying anxiety causing your symptoms.  Please follow up with your doctor this week to recheck you and consider refilling your ativan or other medicines to help with both sleep and anxiety.  ER for increased weakness, numbness, difficuilty speaking or changes in speech or coordination

## 2017-09-02 NOTE — ED Provider Notes (Signed)
[facility] EMERGENCY DEPARTMENT Provider Note   CSN: [id number] Arrival date & time: 09/02/17  1720     History   Chief Complaint Chief Complaint  Patient presents with  . Extremity Weakness    HPI Yesenia Gates is a 36 y.o. female.  HPI  The patient is a 36 year old female, she has a history of anxiety but no other significant medical problems.  She takes daily Lexapro and is supposed to take daily Ativan but she ran out a few weeks ago and never went to get the prescription refilled.  She states that this morning she woke up and around 8:00 in the morning she developed acute onset of the numbness in the left side of her tongue, this was persistent and then spread to her lips and had perioral numbness, she then became very anxious and noticed that her right arm felt heavy.  She denies any visual changes, difficulty with balance or any symptoms in her legs.  This has slightly improved but is still persistent.  There is no associated chest pain shortness of breath or palpitations though she does note a cough for the last 3 months.  Past Medical History:  Diagnosis Date  . Abnormal Pap smear   . Anxiety   . Arthritis   . Chronic back pain   . DDD (degenerative disc disease), lumbar   . Headache   . Herniated disc   . Ovarian cyst   . Spondylisthesis     Patient Active Problem List   Diagnosis Date Noted  . Obesity (BMI 30-39.9) 02/20/2016  . Allergic rhinitis 07/17/2015  . Current smoker 07/17/2015  . Vitamin D deficiency 11/29/2014  . Hyperlipidemia 11/29/2014    Past Surgical History:  Procedure Laterality Date  . dilation and cutterage      OB History    Gravida Para Term Preterm AB Living   2 1 1          SAB TAB Ectopic Multiple Live Births                   Home Medications    Prior to Admission medications   Medication Sig Start Date End Date Taking? Authorizing Provider  atorvastatin (LIPITOR) 40 MG tablet Take 1 tablet (40 mg total) by  mouth daily. 07/17/15   Evelina Dun A, FNP  Cholecalciferol (VITAMIN D PO) Take by mouth.    [provider]  clindamycin (CLEOCIN) 150 MG capsule Take 2 capsules (300 mg total) by mouth 4 (four) times daily. For 7 days 03/16/17   Kem Parkinson, PA-C  diclofenac (VOLTAREN) 75 MG EC tablet Take 1 tablet (75 mg total) by mouth 2 (two) times daily. Take with food 03/16/17   Triplett, Tammy, PA-C  escitalopram (LEXAPRO) 10 MG tablet Take 1 tablet (10 mg total) by mouth daily. 04/04/16   Hassell Done, Mary-Margaret, FNP  fluticasone (FLONASE) 50 MCG/ACT nasal spray Place 2 sprays into both nostrils daily. 11/17/15   Evelina Dun A, FNP  LORazepam (ATIVAN) 0.5 MG tablet Take 0.5 mg by mouth at bedtime.    [provider]  medroxyPROGESTERone (DEPO-PROVERA) 150 MG/ML injection Inject into the muscle. 12/30/16   [provider]  meloxicam (MOBIC) 15 MG tablet Take 1 tablet (15 mg total) by mouth daily. 09/21/15   Hassell Done, Mary-Margaret, FNP  topiramate (TOPAMAX) 50 MG tablet Take 1 tablet (50 mg total) by mouth 2 (two) times daily. 03/07/17   Penumalli, Earlean Polka, MD    Family History Family History  Problem Relation Age of Onset  . Diabetes Mother   . Cancer Mother        cervical  . Rheum arthritis Mother   . Diabetes Father   . Cancer Other   . Diabetes Sister   . Diabetes Brother   . Cancer Maternal Uncle        throat  . Stroke Maternal Uncle   . Cancer Maternal Grandmother        GYN  . Heart attack Maternal Grandfather   . Cancer Cousin        cervical  . Cancer Maternal Uncle        throat  . Cancer Cousin        breast  . Cancer Cousin        cervical    Social History Social History   Tobacco Use  . Smoking status: Current Every Day Smoker    Packs/day: 0.50    Types: Cigarettes  . Smokeless tobacco: Never Used  Substance Use Topics  . Alcohol use: Yes    Comment: occasionally  . Drug use: No     Allergies   Penicillins   Review of  Systems Review of Systems  All other systems reviewed and are negative.    Physical Exam Updated Vital Signs BP (!) 143/88 (BP Location: Left Arm)   Pulse 87   Temp 98.9 F (37.2 C) (Oral)   Resp 20   Ht 5\' 6"  (1.676 m)   Wt 91.6 kg (202 lb)   LMP 08/09/2017   SpO2 100%   BMI 32.60 kg/m   Physical Exam  Constitutional: She appears well-developed and well-nourished. No distress.  HENT:  Head: Normocephalic and atraumatic.  Mouth/Throat: Oropharynx is clear and moist. No oropharyngeal exudate.  Eyes: Conjunctivae and EOM are normal. Pupils are equal, round, and reactive to light. Right eye exhibits no discharge. Left eye exhibits no discharge. No scleral icterus.  Neck: Normal range of motion. Neck supple. No JVD present. No thyromegaly present.  Cardiovascular: Normal rate, regular rhythm, normal heart sounds and intact distal pulses. Exam reveals no gallop and no friction rub.  No murmur heard. Pulmonary/Chest: Effort normal and breath sounds normal. No respiratory distress. She has no wheezes. She has no rales.  Abdominal: Soft. Bowel sounds are normal. She exhibits no distension and no mass. There is no tenderness.  Musculoskeletal: Normal range of motion. She exhibits no edema or tenderness.  Lymphadenopathy:    She has no cervical adenopathy.  Neurological: She is alert. Coordination normal.  Normal gait, normal speech, normal coordination, normal strength in all 4 extremities, normal sensation in the bilateral upper and lower extremities, normal memory and is able to follow commands without any difficulty.  Skin: Skin is warm and dry. No rash noted. No erythema.  Psychiatric: She has a normal mood and affect. Her behavior is normal.  The patient does appear slightly anxious, she has eyes that are shifting from side to side, and she makes poor eye contact, she does not appear depressed and denies substance abuse or suicidality.  Nursing note and vitals reviewed.    ED  Treatments / Results  Labs (all labs ordered are listed, but only abnormal results are displayed) Labs Reviewed - No data to display   Radiology Dg Chest 2 View  Result Date: 09/02/2017 CLINICAL DATA:  Nonproductive cough for 1 month. Tingling on the tongue. Right arm heaviness. EXAM: CHEST  2 VIEW COMPARISON:  01/25/2014 FINDINGS: The heart size and  mediastinal contours are within normal limits. Both lungs are clear. The visualized skeletal structures are unremarkable. IMPRESSION: No active cardiopulmonary disease. Electronically Signed   By: Kathreen Devoid   On: 09/02/2017 18:57    Procedures Procedures (including critical care time)  Medications Ordered in ED Medications  LORazepam (ATIVAN) tablet 1 mg (1 mg Oral Given 09/02/17 1803)     Initial Impression / Assessment and Plan / ED Course  I have reviewed the triage vital signs and the nursing notes.  Pertinent labs & imaging results that were available during my care of the patient were reviewed by me and considered in my medical decision making (see chart for details).     Overall this patient does not appear to be decompensated, she does appear to be anxious, she has a totally normal neurologic exam such that I do not think that her neurologic symptoms are related to a ischemic cerebrum or other type of transient ischemic process.  It is more likely that this is related to anxiety.  She will get 1 dose of Ativan and states that she has not had a chest x-ray that was ordered a couple months ago because of her ongoing cough.  We will obtain a chest x-ray for her while she is here, she was thankful and happy with this.  The patient has been ambulating back and forth to the bathroom without difficulty.  The patient has a negative x-ray, she has been given Ativan, she appears well and has improved symptoms, doubt stroke, stable for discharge, return precautions given  Final Clinical Impressions(s) / ED Diagnoses   Final diagnoses:    Paresthesia of tongue    ED Discharge Orders    None       Noemi Chapel, MD 09/02/17 1911

## 2017-09-02 NOTE — ED Notes (Signed)
MD at bedside. 

## 2017-12-12 ENCOUNTER — Emergency Department (HOSPITAL_COMMUNITY): Payer: Medicaid Other

## 2017-12-12 ENCOUNTER — Other Ambulatory Visit: Payer: Self-pay

## 2017-12-12 ENCOUNTER — Emergency Department (HOSPITAL_COMMUNITY)
Admission: EM | Admit: 2017-12-12 | Discharge: 2017-12-12 | Disposition: A | Discharge status: Home / Self Care | Payer: Medicaid Other | Attending: Emergency Medicine | Admitting: Emergency Medicine

## 2017-12-12 ENCOUNTER — Encounter (HOSPITAL_COMMUNITY): Payer: Self-pay | Admitting: *Deleted

## 2017-12-12 DIAGNOSIS — Z79899 Other long term (current) drug therapy: Secondary | ICD-10-CM | POA: Insufficient documentation

## 2017-12-12 DIAGNOSIS — R079 Chest pain, unspecified: Secondary | ICD-10-CM | POA: Diagnosis present

## 2017-12-12 DIAGNOSIS — F17210 Nicotine dependence, cigarettes, uncomplicated: Secondary | ICD-10-CM | POA: Insufficient documentation

## 2017-12-12 DIAGNOSIS — R0789 Other chest pain: Secondary | ICD-10-CM | POA: Insufficient documentation

## 2017-12-12 LAB — BASIC METABOLIC PANEL
Anion gap: 10 (ref 5–15)
BUN: 12 mg/dL (ref 6–20)
CALCIUM: 8.6 mg/dL — AB (ref 8.9–10.3)
CO2: 23 mmol/L (ref 22–32)
CREATININE: 0.76 mg/dL (ref 0.44–1.00)
Chloride: 104 mmol/L (ref 101–111)
GFR calc Af Amer: >60 mL/min (ref >60)
GFR calc non Af Amer: >60 mL/min (ref >60)
GLUCOSE: 91 mg/dL (ref 65–99)
Potassium: 3.5 mmol/L (ref 3.5–5.1)
Sodium: 137 mmol/L (ref 135–145)

## 2017-12-12 LAB — URINALYSIS, ROUTINE W REFLEX MICROSCOPIC
BILIRUBIN URINE: NEGATIVE
Glucose, UA: NEGATIVE mg/dL
Ketones, ur: NEGATIVE mg/dL
LEUKOCYTES UA: NEGATIVE
Nitrite: NEGATIVE
Protein, ur: NEGATIVE mg/dL
SPECIFIC GRAVITY, URINE: 1.011 (ref 1.005–1.030)
pH: 6 (ref 5.0–8.0)

## 2017-12-12 LAB — CBC WITH DIFFERENTIAL/PLATELET
Basophils Absolute: 0 10*3/uL (ref 0.0–0.1)
Basophils Relative: 0 %
EOS ABS: 0.2 10*3/uL (ref 0.0–0.7)
EOS PCT: 3 %
HCT: 37.2 % (ref 36.0–46.0)
Hemoglobin: 12.1 g/dL (ref 12.0–15.0)
LYMPHS ABS: 2.2 10*3/uL (ref 0.7–4.0)
Lymphocytes Relative: 30 %
MCH: 29.9 pg (ref 26.0–34.0)
MCHC: 32.5 g/dL (ref 30.0–36.0)
MCV: 91.9 fL (ref 78.0–100.0)
MONO ABS: 0.3 10*3/uL (ref 0.1–1.0)
MONOS PCT: 4 %
Neutro Abs: 4.6 10*3/uL (ref 1.7–7.7)
Neutrophils Relative %: 63 %
PLATELETS: 230 10*3/uL (ref 150–400)
RBC: 4.05 MIL/uL (ref 3.87–5.11)
RDW: 14.3 % (ref 11.5–15.5)
WBC: 7.4 10*3/uL (ref 4.0–10.5)

## 2017-12-12 LAB — TROPONIN I
Troponin I: <0.03 ng/mL (ref <0.03)
Troponin I: <0.03 ng/mL (ref <0.03)

## 2017-12-12 LAB — PREGNANCY, URINE: Preg Test, Ur: NEGATIVE

## 2017-12-12 LAB — D-DIMER, QUANTITATIVE (NOT AT ARMC)

## 2017-12-12 MED ORDER — NAPROXEN 250 MG PO TABS: 250.0000 mg | ORAL_TABLET | Freq: Two times a day (BID) | ORAL | 0 refills | Status: DC | PRN

## 2017-12-12 MED ORDER — METHOCARBAMOL 500 MG PO TABS: 1000.0000 mg | ORAL_TABLET | Freq: Four times a day (QID) | ORAL | 0 refills | Status: DC | PRN

## 2017-12-12 MED ORDER — KETOROLAC TROMETHAMINE 30 MG/ML IJ SOLN
30.0000 mg | Freq: Once | INTRAMUSCULAR | Status: AC
  Administered 2017-12-12: 30 mg via INTRAVENOUS
  Filled 2017-12-12: qty 1

## 2017-12-12 NOTE — ED Provider Notes (Signed)
[facility] EMERGENCY DEPARTMENT Provider Note   CSN: [id number] Arrival date & time: 12/12/17  1714     History   Chief Complaint Chief Complaint  Patient presents with  . Chest Pain    HPI Yesenia Gates is a 37 y.o. female.  HPI  Pt was seen at 1725. Per pt, c/o gradual onset and persistence of constant mid-sternal chest "pain" that began approximately 11am today PTA. Pt states she was at work sitting when her pain began. Pt describes the CP as "sharp," worsens with palpation of the area. Pt states she has had "a smoker's cough" recently due to "trying to stop smoking." Pt was evaluated at [facility] PTA, then sent to the ED for further evaluation. Pt was given ASA en route. Denies palpitations, no SOB, no abd pain, no N/V/D, no back pain, no fevers, no rash, no injury.   Past Medical History:  Diagnosis Date  . Abnormal Pap smear   . Anxiety   . Arthritis   . Chronic back pain   . DDD (degenerative disc disease), lumbar   . Headache   . Herniated disc   . Ovarian cyst   . Spondylisthesis     Patient Active Problem List   Diagnosis Date Noted  . Obesity (BMI 30-39.9) 02/20/2016  . Allergic rhinitis 07/17/2015  . Current smoker 07/17/2015  . Vitamin D deficiency 11/29/2014  . Hyperlipidemia 11/29/2014    Past Surgical History:  Procedure Laterality Date  . dilation and cutterage       OB History    Gravida  2   Para  1   Term  1   Preterm      AB      Living        SAB      TAB      Ectopic      Multiple      Live Births               Home Medications    Prior to Admission medications   Medication Sig Start Date End Date Taking? Authorizing Provider  atorvastatin (LIPITOR) 40 MG tablet Take 1 tablet (40 mg total) by mouth daily. 07/17/15   Evelina Dun A, FNP  Cholecalciferol (VITAMIN D PO) Take by mouth.    [provider]  clindamycin (CLEOCIN) 150 MG capsule Take 2 capsules (300 mg total) by mouth 4 (four) times daily.  For 7 days 03/16/17   Kem Parkinson, PA-C  diclofenac (VOLTAREN) 75 MG EC tablet Take 1 tablet (75 mg total) by mouth 2 (two) times daily. Take with food 03/16/17   Triplett, Tammy, PA-C  escitalopram (LEXAPRO) 10 MG tablet Take 1 tablet (10 mg total) by mouth daily. 04/04/16   Hassell Done, Mary-Margaret, FNP  fluticasone (FLONASE) 50 MCG/ACT nasal spray Place 2 sprays into both nostrils daily. 11/17/15   Evelina Dun A, FNP  LORazepam (ATIVAN) 0.5 MG tablet Take 0.5 mg by mouth at bedtime.    [provider]  medroxyPROGESTERone (DEPO-PROVERA) 150 MG/ML injection Inject into the muscle. 12/30/16   [provider]  meloxicam (MOBIC) 15 MG tablet Take 1 tablet (15 mg total) by mouth daily. 09/21/15   Hassell Done, Mary-Margaret, FNP  topiramate (TOPAMAX) 50 MG tablet Take 1 tablet (50 mg total) by mouth 2 (two) times daily. 03/07/17   Penumalli, Earlean Polka, MD    Family History Family History  Problem Relation Age of Onset  . Diabetes Mother   . Cancer Mother  cervical  . Rheum arthritis Mother   . Diabetes Father   . Cancer Other   . Diabetes Sister   . Diabetes Brother   . Cancer Maternal Uncle        throat  . Stroke Maternal Uncle   . Cancer Maternal Grandmother        GYN  . Heart attack Maternal Grandfather   . Cancer Cousin        cervical  . Cancer Maternal Uncle        throat  . Cancer Cousin        breast  . Cancer Cousin        cervical    Social History Social History   Tobacco Use  . Smoking status: Current Every Day Smoker    Packs/day: 0.50    Types: Cigarettes, E-cigarettes  . Smokeless tobacco: Never Used  Substance Use Topics  . Alcohol use: Yes    Comment: occasionally  . Drug use: No     Allergies   Penicillins   Review of Systems Review of Systems ROS: Statement: All systems negative except as marked or noted in the HPI; Constitutional: Negative for fever and chills. ; ; Eyes: Negative for eye pain, redness and discharge. ; ; ENMT:  Negative for ear pain, hoarseness, nasal congestion, sinus pressure and sore throat. ; ; Cardiovascular: Negative for palpitations, diaphoresis, dyspnea and peripheral edema. ; ; Respiratory: +cough. Negative for wheezing and stridor. ; ; Gastrointestinal: Negative for nausea, vomiting, diarrhea, abdominal pain, blood in stool, hematemesis, jaundice and rectal bleeding. . ; ; Genitourinary: Negative for dysuria, flank pain and hematuria. ; ; Musculoskeletal: +chest wall pain. Negative for back pain and neck pain. Negative for swelling and trauma.; ; Skin: Negative for pruritus, rash, abrasions, blisters, bruising and skin lesion.; ; Neuro: Negative for headache, lightheadedness and neck stiffness. Negative for weakness, altered level of consciousness, altered mental status, extremity weakness, paresthesias, involuntary movement, seizure and syncope.       Physical Exam Updated Vital Signs BP 124/81   Pulse 83   Temp 97.9 F (36.6 C) (Oral)   Resp 17   Ht 5\' 6"  (1.676 m)   Wt 93 kg (205 lb)   LMP 12/04/2017   SpO2 100%   BMI 33.09 kg/m    BP 117/68 (BP Location: Right Arm)   Pulse 88   Temp 97.8 F (36.6 C)   Resp 17   Ht 5\' 6"  (1.676 m)   Wt 93 kg (205 lb)   LMP 12/04/2017   SpO2 99%   BMI 33.09 kg/m    Physical Exam 1730: Physical examination:  Nursing notes reviewed; Vital signs and O2 SAT reviewed;  Constitutional: Well developed, Well nourished, Well hydrated, In no acute distress; Head:  Normocephalic, atraumatic; Eyes: EOMI, PERRL, No scleral icterus; ENMT: Mouth and pharynx normal, Mucous membranes moist; Neck: Supple, Full range of motion, No lymphadenopathy; Cardiovascular: Regular rate and rhythm, No gallop; Respiratory: Breath sounds clear & equal bilaterally, No wheezes.  Speaking full sentences with ease, Normal respiratory effort/excursion; Chest: +bilat parasternal areas tender to palp which reproduces pt's pain. No soft tissue crepitus, no deformity. Movement normal;  Abdomen: Soft, Nontender, Nondistended, Normal bowel sounds; Genitourinary: No CVA tenderness; Extremities: Peripheral pulses normal, No tenderness, No edema, No calf edema or asymmetry.; Neuro: AA&Ox3, Major CN grossly intact.  Speech clear. No gross focal motor or sensory deficits in extremities.; Skin: Color normal, Warm, Dry.   ED Treatments / Results  Labs (  all labs ordered are listed, but only abnormal results are displayed)   EKG EKG Interpretation  Date/Time:  Friday December 12 2017 17:22:22 EDT Ventricular Rate:  85 PR Interval:    QRS Duration: 89 QT Interval:  402 QTC Calculation: 478 R Axis:   89 Text Interpretation:  Sinus rhythm Baseline wander When compared with ECG of 01/25/2014 No significant change was found Confirmed by Francine Graven [phone]) on 12/12/2017 5:37:59 PM   Radiology   Procedures Procedures (including critical care time)  Medications Ordered in ED Medications  ketorolac (TORADOL) 30 MG/ML injection 30 mg (has no administration in time range)     Initial Impression / Assessment and Plan / ED Course  I have reviewed the triage vital signs and the nursing notes.  Pertinent labs & imaging results that were available during my care of the patient were reviewed by me and considered in my medical decision making (see chart for details).  MDM Reviewed: previous chart, nursing note and vitals Reviewed previous: labs and ECG Interpretation: labs, ECG and x-ray   Results for orders placed or performed during the hospital encounter of 14/97/02  Basic metabolic panel  Result Value Ref Range   Sodium 137 135 - 145 mmol/L   Potassium 3.5 3.5 - 5.1 mmol/L   Chloride 104 101 - 111 mmol/L   CO2 23 22 - 32 mmol/L   Glucose, Bld 91 65 - 99 mg/dL   BUN 12 6 - 20 mg/dL   Creatinine, Ser 0.76 0.44 - 1.00 mg/dL   Calcium 8.6 (L) 8.9 - 10.3 mg/dL   GFR calc non Af Amer >60 >60 mL/min   GFR calc Af Amer >60 >60 mL/min   Anion gap 10 5 - 15  Troponin I    Result Value Ref Range   Troponin I <0.03 <0.03 ng/mL  CBC with Differential  Result Value Ref Range   WBC 7.4 4.0 - 10.5 K/uL   RBC 4.05 3.87 - 5.11 MIL/uL   Hemoglobin 12.1 12.0 - 15.0 g/dL   HCT 37.2 36.0 - 46.0 %   MCV 91.9 78.0 - 100.0 fL   MCH 29.9 26.0 - 34.0 pg   MCHC 32.5 30.0 - 36.0 g/dL   RDW 14.3 11.5 - 15.5 %   Platelets 230 150 - 400 K/uL   Neutrophils Relative % 63 %   Neutro Abs 4.6 1.7 - 7.7 K/uL   Lymphocytes Relative 30 %   Lymphs Abs 2.2 0.7 - 4.0 K/uL   Monocytes Relative 4 %   Monocytes Absolute 0.3 0.1 - 1.0 K/uL   Eosinophils Relative 3 %   Eosinophils Absolute 0.2 0.0 - 0.7 K/uL   Basophils Relative 0 %   Basophils Absolute 0.0 0.0 - 0.1 K/uL  D-dimer, quantitative  Result Value Ref Range   D-Dimer, Quant <0.27 0.00 - 0.50 ug/mL-FEU  Pregnancy, urine  Result Value Ref Range   Preg Test, Ur NEGATIVE NEGATIVE  Urinalysis, Routine w reflex microscopic  Result Value Ref Range   Color, Urine YELLOW YELLOW   APPearance CLEAR CLEAR   Specific Gravity, Urine 1.011 1.005 - 1.030   pH 6.0 5.0 - 8.0   Glucose, UA NEGATIVE NEGATIVE mg/dL   Hgb urine dipstick LARGE (A) NEGATIVE   Bilirubin Urine NEGATIVE NEGATIVE   Ketones, ur NEGATIVE NEGATIVE mg/dL   Protein, ur NEGATIVE NEGATIVE mg/dL   Nitrite NEGATIVE NEGATIVE   Leukocytes, UA NEGATIVE NEGATIVE   RBC / HPF 0-5 0 - 5 RBC/hpf  WBC, UA 0-5 0 - 5 WBC/hpf   Bacteria, UA RARE (A) NONE SEEN   Squamous Epithelial / LPF 0-5 (A) NONE SEEN   Mucus PRESENT   Troponin I  Result Value Ref Range   Troponin I <0.03 <0.03 ng/mL   Dg Chest 2 View Result Date: 12/12/2017 CLINICAL DATA:  Mid chest pain and right jaw pain. Right arm pain for 3 hours. Nonproductive cough. EXAM: CHEST - 2 VIEW COMPARISON:  09/02/2017 FINDINGS: The heart size and mediastinal contours are within normal limits. Both lungs are clear. The visualized skeletal structures are unremarkable. IMPRESSION: No active cardiopulmonary disease.  Electronically Signed   By: Van Clines M.D.   On: 12/12/2017 18:21     2040:  Doubt PE as cause for symptoms with normal d-dimer and low risk [place].  Doubt ACS as cause for symptoms with normal troponin x2 and unchanged EKG from previous after 9 hours of constant atypical symptoms. Pt has tol PO well while in the ED without N/V. States she feels better and is ready to go home now. Tx symptomatically at this time. Dx and testing d/w pt and family.  Questions answered.  Verb understanding, agreeable to d/c home with outpt f/u.    Final Clinical Impressions(s) / ED Diagnoses   Final diagnoses:  None    ED Discharge Orders    None       Francine Graven, DO 12/17/17 1610

## 2017-12-12 NOTE — Discharge Instructions (Addendum)
Take the prescriptions as directed.  Also take over the counter tylenol, as directed on packaging, as needed for discomfort. Apply moist heat or ice to the area(s) of discomfort, for 15 minutes at a time, several times per day for the next few days.  Do not fall asleep on a heating or ice pack.  Call your regular medical doctor on Monday to schedule a follow up appointment next week.  Return to the Emergency Department immediately if worsening.

## 2017-12-12 NOTE — ED Triage Notes (Signed)
Pt brought in by [facility] with c/o sharp mid chest pain, right jaw pain, right arm pain x 3 hours. Pt was seen at Urgent Care earlier and was sent to ED for evaluation. EMS reports EKG showed NSR. Pt received 324mg  ASA. Pt refused Nitro from EMS.

## 2017-12-12 NOTE — ED Notes (Signed)
ED Provider at bedside. 

## 2018-05-19 ENCOUNTER — Emergency Department (HOSPITAL_COMMUNITY)
Admission: EM | Admit: 2018-05-19 | Discharge: 2018-05-19 | Disposition: A | Discharge status: Home / Self Care | Payer: Medicaid Other | Attending: Emergency Medicine | Admitting: Emergency Medicine

## 2018-05-19 ENCOUNTER — Encounter (HOSPITAL_COMMUNITY): Payer: Self-pay

## 2018-05-19 ENCOUNTER — Other Ambulatory Visit: Payer: Self-pay

## 2018-05-19 DIAGNOSIS — Z79899 Other long term (current) drug therapy: Secondary | ICD-10-CM | POA: Insufficient documentation

## 2018-05-19 DIAGNOSIS — M79631 Pain in right forearm: Secondary | ICD-10-CM | POA: Diagnosis present

## 2018-05-19 DIAGNOSIS — Y929 Unspecified place or not applicable: Secondary | ICD-10-CM | POA: Insufficient documentation

## 2018-05-19 DIAGNOSIS — W57XXXA Bitten or stung by nonvenomous insect and other nonvenomous arthropods, initial encounter: Secondary | ICD-10-CM | POA: Diagnosis not present

## 2018-05-19 DIAGNOSIS — Y939 Activity, unspecified: Secondary | ICD-10-CM | POA: Diagnosis not present

## 2018-05-19 DIAGNOSIS — S50861A Insect bite (nonvenomous) of right forearm, initial encounter: Secondary | ICD-10-CM

## 2018-05-19 DIAGNOSIS — F17210 Nicotine dependence, cigarettes, uncomplicated: Secondary | ICD-10-CM | POA: Insufficient documentation

## 2018-05-19 DIAGNOSIS — Y999 Unspecified external cause status: Secondary | ICD-10-CM | POA: Diagnosis not present

## 2018-05-19 MED ORDER — DIPHENHYDRAMINE HCL 25 MG PO CAPS
25.0000 mg | ORAL_CAPSULE | Freq: Once | ORAL | Status: AC
  Administered 2018-05-19: 25 mg via ORAL
  Filled 2018-05-19: qty 1

## 2018-05-19 NOTE — Discharge Instructions (Signed)
Ibuprofen for pain, Benadryl every 4 hours x 24 hours,  Cool compresses,  See your Physician for recheck if any problems.

## 2018-05-19 NOTE — ED Triage Notes (Signed)
Pt reports was outside on her front porch smoking and felt a spider bite her twice.  Pt has redness and swelling to r forearm.  Pt took ibuprofen at 0645.

## 2018-05-19 NOTE — ED Provider Notes (Signed)
[facility] EMERGENCY DEPARTMENT Provider Note   CSN: [id number] Arrival date & time: 05/19/18  5625     History   Chief Complaint Chief Complaint  Patient presents with  . Insect Bite    HPI Yesenia Gates is a 37 y.o. female.  The history is provided by the patient. No language interpreter was used.  Arm Injury   This is a new problem. The current episode started 1 to 2 hours ago. The problem occurs constantly. The problem has been gradually improving. The pain is present in the right arm. The quality of the pain is described as aching. The pain is moderate. Associated symptoms include itching. Pertinent negatives include no numbness. She has tried nothing for the symptoms. The treatment provided no relief. There has been no history of extremity trauma.  Pt reports something was crawling on her and bit her.  Pt describes a stinging pain   Past Medical History:  Diagnosis Date  . Abnormal Pap smear   . Anxiety   . Arthritis   . Chronic back pain   . DDD (degenerative disc disease), lumbar   . Headache   . Herniated disc   . Ovarian cyst   . Spondylisthesis     Patient Active Problem List   Diagnosis Date Noted  . Obesity (BMI 30-39.9) 02/20/2016  . Allergic rhinitis 07/17/2015  . Current smoker 07/17/2015  . Vitamin D deficiency 11/29/2014  . Hyperlipidemia 11/29/2014    Past Surgical History:  Procedure Laterality Date  . dilation and cutterage       OB History    Gravida  2   Para  1   Term  1   Preterm      AB      Living        SAB      TAB      Ectopic      Multiple      Live Births               Home Medications    Prior to Admission medications   Medication Sig Start Date End Date Taking? Authorizing Provider  escitalopram (LEXAPRO) 10 MG tablet Take 1 tablet (10 mg total) by mouth daily. 04/04/16   Hassell Done, Mary-Margaret, FNP  LORazepam (ATIVAN) 0.5 MG tablet Take 0.5 mg by mouth daily as needed for anxiety.      [provider]  methocarbamol (ROBAXIN) 500 MG tablet Take 2 tablets (1,000 mg total) by mouth 4 (four) times daily as needed for muscle spasms (muscle spasm/pain). 12/12/17   Francine Graven, DO  naproxen (NAPROSYN) 250 MG tablet Take 1 tablet (250 mg total) by mouth 2 (two) times daily as needed for mild pain or moderate pain (take with food). 12/12/17   Francine Graven, DO    Family History Family History  Problem Relation Age of Onset  . Diabetes Mother   . Cancer Mother        cervical  . Rheum arthritis Mother   . Diabetes Father   . Cancer Other   . Diabetes Sister   . Diabetes Brother   . Cancer Maternal Uncle        throat  . Stroke Maternal Uncle   . Cancer Maternal Grandmother        GYN  . Heart attack Maternal Grandfather   . Cancer Cousin        cervical  . Cancer Maternal Uncle  throat  . Cancer Cousin        breast  . Cancer Cousin        cervical    Social History Social History   Tobacco Use  . Smoking status: Current Every Day Smoker    Packs/day: 0.50    Types: Cigarettes, E-cigarettes  . Smokeless tobacco: Never Used  Substance Use Topics  . Alcohol use: Yes    Comment: occasionally  . Drug use: No     Allergies   Penicillins   Review of Systems Review of Systems  Skin: Positive for itching.  Neurological: Negative for numbness.  All other systems reviewed and are negative.    Physical Exam Updated Vital Signs BP 134/86 (BP Location: Left Arm)   Pulse 93   Temp 98 F (36.7 C) (Oral)   Resp 20   Ht 5\' 7"  (1.702 m)   Wt 95.3 kg   LMP 05/13/2018   SpO2 100%   BMI 32.89 kg/m   Physical Exam  Constitutional: She is oriented to person, place, and time. She appears well-developed and well-nourished.  HENT:  Head: Normocephalic.  Cardiovascular: Normal rate.  Pulmonary/Chest: Effort normal.  Musculoskeletal: Normal range of motion.  6x2 cm area of erythema, center of induration slight swelling     Neurological: She is alert and oriented to person, place, and time.  Skin: Skin is warm.  Psychiatric: She has a normal mood and affect.  Nursing note and vitals reviewed.    ED Treatments / Results  Labs (all labs ordered are listed, but only abnormal results are displayed) Labs Reviewed - No data to display  EKG None  Radiology No results found.  Procedures Procedures (including critical care time)  Medications Ordered in ED Medications  diphenhydrAMINE (BENADRYL) capsule 25 mg (has no administration in time range)     Initial Impression / Assessment and Plan / ED Course  I have reviewed the triage vital signs and the nursing notes.  Pertinent labs & imaging results that were available during my care of the patient were reviewed by me and considered in my medical decision making (see chart for details).     MDM  I suspect insect sting,  Pt counseled on insect stings/bites.   Pt advised hydrocortisone ointment, benadryl and ibuprofen  Final Clinical Impressions(s) / ED Diagnoses   Final diagnoses:  Insect bite of right forearm, initial encounter    ED Discharge Orders    None    An After Visit Summary was printed and given to the patient.    Fransico Meadow, [place] 05/19/18 1062    Margette Fast, MD 05/19/18 2008

## 2018-11-08 ENCOUNTER — Encounter (HOSPITAL_COMMUNITY): Payer: Self-pay | Admitting: Emergency Medicine

## 2018-11-08 ENCOUNTER — Emergency Department (HOSPITAL_COMMUNITY)
Admission: EM | Admit: 2018-11-08 | Discharge: 2018-11-08 | Disposition: A | Discharge status: Home / Self Care | Payer: Medicaid Other | Attending: Emergency Medicine | Admitting: Emergency Medicine

## 2018-11-08 ENCOUNTER — Other Ambulatory Visit: Payer: Self-pay

## 2018-11-08 DIAGNOSIS — H1131 Conjunctival hemorrhage, right eye: Secondary | ICD-10-CM | POA: Insufficient documentation

## 2018-11-08 DIAGNOSIS — W25XXXA Contact with sharp glass, initial encounter: Secondary | ICD-10-CM | POA: Insufficient documentation

## 2018-11-08 DIAGNOSIS — Y999 Unspecified external cause status: Secondary | ICD-10-CM | POA: Insufficient documentation

## 2018-11-08 DIAGNOSIS — T148XXA Other injury of unspecified body region, initial encounter: Secondary | ICD-10-CM

## 2018-11-08 DIAGNOSIS — S0501XA Injury of conjunctiva and corneal abrasion without foreign body, right eye, initial encounter: Secondary | ICD-10-CM | POA: Insufficient documentation

## 2018-11-08 DIAGNOSIS — F17210 Nicotine dependence, cigarettes, uncomplicated: Secondary | ICD-10-CM | POA: Insufficient documentation

## 2018-11-08 DIAGNOSIS — Z79899 Other long term (current) drug therapy: Secondary | ICD-10-CM | POA: Insufficient documentation

## 2018-11-08 DIAGNOSIS — Y9389 Activity, other specified: Secondary | ICD-10-CM | POA: Insufficient documentation

## 2018-11-08 DIAGNOSIS — Y9289 Other specified places as the place of occurrence of the external cause: Secondary | ICD-10-CM | POA: Insufficient documentation

## 2018-11-08 DIAGNOSIS — Z23 Encounter for immunization: Secondary | ICD-10-CM | POA: Insufficient documentation

## 2018-11-08 MED ORDER — TETANUS-DIPHTH-ACELL PERTUSSIS 5-2.5-18.5 LF-MCG/0.5 IM SUSP
0.5000 mL | Freq: Once | INTRAMUSCULAR | Status: AC
  Administered 2018-11-08: 0.5 mL via INTRAMUSCULAR
  Filled 2018-11-08: qty 0.5

## 2018-11-08 MED ORDER — FLUORESCEIN SODIUM 1 MG OP STRP
1.0000 | ORAL_STRIP | Freq: Once | OPHTHALMIC | Status: AC
  Administered 2018-11-08: 1 via OPHTHALMIC
  Filled 2018-11-08: qty 1

## 2018-11-08 MED ORDER — TETRACAINE HCL 0.5 % OP SOLN
2.0000 [drp] | Freq: Once | OPHTHALMIC | Status: AC
  Administered 2018-11-08: 2 [drp] via OPHTHALMIC
  Filled 2018-11-08: qty 4
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
## 2018-11-08 NOTE — Discharge Instructions (Addendum)
Follow-up with your eye doctor if your symptoms do not resolve in the next day or 2 started having difficulty with your vision
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
## 2019-01-26 ENCOUNTER — Emergency Department (HOSPITAL_COMMUNITY)
Admission: EM | Admit: 2019-01-26 | Discharge: 2019-01-27 | Disposition: A | Discharge status: Home / Self Care | Payer: Medicaid Other | Attending: Emergency Medicine | Admitting: Emergency Medicine

## 2019-01-26 ENCOUNTER — Other Ambulatory Visit: Payer: Self-pay

## 2019-01-26 ENCOUNTER — Encounter (HOSPITAL_COMMUNITY): Payer: Self-pay | Admitting: Emergency Medicine

## 2019-01-26 DIAGNOSIS — K0889 Other specified disorders of teeth and supporting structures: Secondary | ICD-10-CM

## 2019-01-26 DIAGNOSIS — F17210 Nicotine dependence, cigarettes, uncomplicated: Secondary | ICD-10-CM | POA: Insufficient documentation

## 2019-01-26 DIAGNOSIS — Z79899 Other long term (current) drug therapy: Secondary | ICD-10-CM | POA: Insufficient documentation

## 2019-01-26 NOTE — ED Triage Notes (Signed)
Pt c/o of an abscess in her upper right tooth. Pt was seen at another hospital over a month ago, was prescribed a ABX and something for inflammation. Had a tele visit with her PCP, was Rx another dose of ABX. Pain, swelling, drooping of right eye that started last night. Pt is very anxious.

## 2019-01-27 ENCOUNTER — Encounter (HOSPITAL_COMMUNITY): Payer: Self-pay | Admitting: Emergency Medicine

## 2019-01-27 ENCOUNTER — Other Ambulatory Visit: Payer: Self-pay

## 2019-01-27 ENCOUNTER — Emergency Department (HOSPITAL_COMMUNITY)
Admission: EM | Admit: 2019-01-27 | Discharge: 2019-01-27 | Disposition: A | Discharge status: Home / Self Care | Payer: Medicaid Other | Attending: Emergency Medicine | Admitting: Emergency Medicine

## 2019-01-27 DIAGNOSIS — Z79899 Other long term (current) drug therapy: Secondary | ICD-10-CM | POA: Insufficient documentation

## 2019-01-27 DIAGNOSIS — F419 Anxiety disorder, unspecified: Secondary | ICD-10-CM | POA: Insufficient documentation

## 2019-01-27 DIAGNOSIS — K047 Periapical abscess without sinus: Secondary | ICD-10-CM

## 2019-01-27 DIAGNOSIS — F17290 Nicotine dependence, other tobacco product, uncomplicated: Secondary | ICD-10-CM | POA: Insufficient documentation

## 2019-01-27 MED ORDER — IBUPROFEN 800 MG PO TABS: 800.0000 mg | ORAL_TABLET | Freq: Three times a day (TID) | ORAL | 0 refills | Status: DC | PRN

## 2019-01-27 MED ORDER — CLINDAMYCIN HCL 150 MG PO CAPS
300.0000 mg | ORAL_CAPSULE | Freq: Once | ORAL | Status: AC
  Administered 2019-01-27: 300 mg via ORAL
  Filled 2019-01-27: qty 2

## 2019-01-27 MED ORDER — CLINDAMYCIN HCL 300 MG PO CAPS: 300.0000 mg | ORAL_CAPSULE | Freq: Four times a day (QID) | ORAL | 0 refills | Status: DC

## 2019-01-27 NOTE — Discharge Instructions (Addendum)
Follow up with the dentist as planned

## 2019-01-27 NOTE — ED Triage Notes (Signed)
Patient reports increasing edema in her face. Seen here last night for same. Patient states her symptoms are worsening.

## 2019-01-27 NOTE — ED Provider Notes (Signed)
[facility] EMERGENCY DEPARTMENT Provider Note   CSN: [id number] Arrival date & time: 01/26/19  2332    History   Chief Complaint Chief Complaint  Patient presents with  . Abscess    HPI Yesenia Gates is a 38 y.o. female.     Patient complains of a toothache.  She has been on antibiotics twice for this.  She has an appointment Thursday with the dentist  The history is provided by the patient.  Abscess  Location:  Mouth Mouth abscess location:  R inner cheek Abscess quality: not draining   Red streaking: no   Progression:  Worsening Chronicity:  Recurrent Context: not diabetes   Relieved by:  Nothing Worsened by:  Nothing Associated symptoms: no fatigue and no headaches     Past Medical History:  Diagnosis Date  . Abnormal Pap smear   . Anxiety   . Arthritis   . Chronic back pain   . DDD (degenerative disc disease), lumbar   . Headache   . Herniated disc   . Ovarian cyst   . Spondylisthesis     Patient Active Problem List   Diagnosis Date Noted  . Obesity (BMI 30-39.9) 02/20/2016  . Allergic rhinitis 07/17/2015  . Current smoker 07/17/2015  . Vitamin D deficiency 11/29/2014  . Hyperlipidemia 11/29/2014    Past Surgical History:  Procedure Laterality Date  . dilation and cutterage       OB History    Gravida  2   Para  1   Term  1   Preterm      AB      Living        SAB      TAB      Ectopic      Multiple      Live Births               Home Medications    Prior to Admission medications   Medication Sig Start Date End Date Taking? Authorizing Provider  clindamycin (CLEOCIN) 300 MG capsule Take 1 capsule (300 mg total) by mouth 4 (four) times daily. X 7 days 01/27/19   Milton Ferguson, MD  escitalopram (LEXAPRO) 10 MG tablet Take 1 tablet (10 mg total) by mouth daily. 04/04/16   Hassell Done, Mary-Margaret, FNP  ibuprofen (ADVIL) 800 MG tablet Take 1 tablet (800 mg total) by mouth every 8 (eight) hours as needed for  moderate pain. 01/27/19   Milton Ferguson, MD  LORazepam (ATIVAN) 0.5 MG tablet Take 0.5 mg by mouth daily as needed for anxiety.     [provider]    Family History Family History  Problem Relation Age of Onset  . Diabetes Mother   . Cancer Mother        cervical  . Rheum arthritis Mother   . Diabetes Father   . Cancer Other   . Diabetes Sister   . Diabetes Brother   . Cancer Maternal Uncle        throat  . Stroke Maternal Uncle   . Cancer Maternal Grandmother        GYN  . Heart attack Maternal Grandfather   . Cancer Cousin        cervical  . Cancer Maternal Uncle        throat  . Cancer Cousin        breast  . Cancer Cousin        cervical    Social History Social History  Tobacco Use  . Smoking status: Current Every Day Smoker    Packs/day: 0.50    Types: Cigarettes, E-cigarettes  . Smokeless tobacco: Never Used  Substance Use Topics  . Alcohol use: Yes    Comment: occasionally  . Drug use: No     Allergies   Penicillins   Review of Systems Review of Systems  Constitutional: Negative for appetite change and fatigue.  HENT: Negative for congestion, ear discharge and sinus pressure.        Right toothache  Eyes: Negative for discharge.  Respiratory: Negative for cough.   Cardiovascular: Negative for chest pain.  Gastrointestinal: Negative for abdominal pain and diarrhea.  Genitourinary: Negative for frequency and hematuria.  Musculoskeletal: Negative for back pain.  Skin: Negative for rash.  Neurological: Negative for seizures and headaches.  Psychiatric/Behavioral: Negative for hallucinations.     Physical Exam Updated Vital Signs BP 140/85   Pulse (!) 111   Temp 98.5 F (36.9 C)   Resp 19   Ht 5\' 6"  (1.676 m)   Wt 97.5 kg   LMP 01/10/2019   SpO2 98%   BMI 34.70 kg/m   Physical Exam Vitals signs and nursing note reviewed.  Constitutional:      Appearance: She is well-developed.  HENT:     Head: Normocephalic.      Nose: Nose normal.     Mouth/Throat:     Comments: Tender right upper tooth Eyes:     General: No scleral icterus.    Conjunctiva/sclera: Conjunctivae normal.  Neck:     Musculoskeletal: Neck supple.     Thyroid: No thyromegaly.  Cardiovascular:     Rate and Rhythm: Normal rate and regular rhythm.     Heart sounds: No murmur. No friction rub. No gallop.   Pulmonary:     Breath sounds: No stridor. No wheezing or rales.  Chest:     Chest wall: No tenderness.  Abdominal:     General: There is no distension.     Tenderness: There is no abdominal tenderness. There is no rebound.  Musculoskeletal: Normal range of motion.  Lymphadenopathy:     Cervical: No cervical adenopathy.  Skin:    Findings: No erythema or rash.  Neurological:     Mental Status: She is oriented to person, place, and time.     Motor: No abnormal muscle tone.     Coordination: Coordination normal.  Psychiatric:        Behavior: Behavior normal.      ED Treatments / Results  Labs (all labs ordered are listed, but only abnormal results are displayed) Labs Reviewed - No data to display  EKG None  Radiology No results found.  Procedures Procedures (including critical care time)  Medications Ordered in ED Medications  clindamycin (CLEOCIN) capsule 300 mg (has no administration in time range)     Initial Impression / Assessment and Plan / ED Course  I have reviewed the triage vital signs and the nursing notes.  Pertinent labs & imaging results that were available during my care of the patient were reviewed by me and considered in my medical decision making (see chart for details).        Pt with toothache and abscess.  Pt will see dentist in 2 days and is giving clindamycin and Motrin  Final Clinical Impressions(s) / ED Diagnoses   Final diagnoses:  Toothache    ED Discharge Orders         Ordered    clindamycin (  CLEOCIN) 300 MG capsule  4 times daily     01/27/19 0011    ibuprofen  (ADVIL) 800 MG tablet  Every 8 hours PRN     01/27/19 0011           Milton Ferguson, MD 01/27/19 0022

## 2019-01-28 NOTE — ED Provider Notes (Signed)
[facility] EMERGENCY DEPARTMENT Provider Note   CSN: [id number] Arrival date & time: 01/27/19  1143    History   Chief Complaint Chief Complaint  Patient presents with  . Facial Swelling    HPI Yesenia Gates is a 38 y.o. female.     HPI   69yF with R facial swelling. Just seen in the ED and placed on abx for dental abscess. She is coming back in because the swelling is worse. She says she is anxious. No fever. No difficulty with breathing/swallowing. Has appointment with dentist tomorrow.   Past Medical History:  Diagnosis Date  . Abnormal Pap smear   . Anxiety   . Arthritis   . Chronic back pain   . DDD (degenerative disc disease), lumbar   . Headache   . Herniated disc   . Ovarian cyst   . Spondylisthesis     Patient Active Problem List   Diagnosis Date Noted  . Obesity (BMI 30-39.9) 02/20/2016  . Allergic rhinitis 07/17/2015  . Current smoker 07/17/2015  . Vitamin D deficiency 11/29/2014  . Hyperlipidemia 11/29/2014    Past Surgical History:  Procedure Laterality Date  . dilation and cutterage       OB History    Gravida  2   Para  1   Term  1   Preterm      AB      Living        SAB      TAB      Ectopic      Multiple      Live Births               Home Medications    Prior to Admission medications   Medication Sig Start Date End Date Taking? Authorizing Provider  clindamycin (CLEOCIN) 300 MG capsule Take 1 capsule (300 mg total) by mouth 4 (four) times daily. X 7 days 01/27/19   Milton Ferguson, MD  escitalopram (LEXAPRO) 10 MG tablet Take 1 tablet (10 mg total) by mouth daily. 04/04/16   Hassell Done, Mary-Margaret, FNP  ibuprofen (ADVIL) 800 MG tablet Take 1 tablet (800 mg total) by mouth every 8 (eight) hours as needed for moderate pain. 01/27/19   Milton Ferguson, MD  LORazepam (ATIVAN) 0.5 MG tablet Take 0.5 mg by mouth daily as needed for anxiety.     [provider]    Family History Family History  Problem  Relation Age of Onset  . Diabetes Mother   . Cancer Mother        cervical  . Rheum arthritis Mother   . Diabetes Father   . Cancer Other   . Diabetes Sister   . Diabetes Brother   . Cancer Maternal Uncle        throat  . Stroke Maternal Uncle   . Cancer Maternal Grandmother        GYN  . Heart attack Maternal Grandfather   . Cancer Cousin        cervical  . Cancer Maternal Uncle        throat  . Cancer Cousin        breast  . Cancer Cousin        cervical    Social History Social History   Tobacco Use  . Smoking status: Current Every Day Smoker    Packs/day: 0.50    Types: Cigarettes, E-cigarettes  . Smokeless tobacco: Never Used  Substance Use Topics  . Alcohol use: Yes  Comment: occasionally  . Drug use: No     Allergies   Penicillins   Review of Systems Review of Systems  All systems reviewed and negative, other than as noted in HPI.  Physical Exam Updated Vital Signs BP 127/78 (BP Location: Right Arm)   Pulse 97   Temp 99 F (37.2 C) (Oral)   Resp 18   Ht 5\' 6"  (1.676 m)   Wt 97.5 kg   LMP 01/10/2019   SpO2 100%   BMI 34.70 kg/m   Physical Exam Vitals signs and nursing note reviewed.  Constitutional:      General: She is not in acute distress.    Appearance: She is well-developed.  HENT:     Head: Normocephalic.     Comments: Mild R maxillary swelling. TTP. No trismus. Oropharynx clear. Neck is supple. Normal sounding voice.  Eyes:     General:        Right eye: No discharge.        Left eye: No discharge.     Conjunctiva/sclera: Conjunctivae normal.  Neck:     Musculoskeletal: Neck supple.  Cardiovascular:     Rate and Rhythm: Normal rate and regular rhythm.     Heart sounds: Normal heart sounds. No murmur. No friction rub. No gallop.   Pulmonary:     Effort: Pulmonary effort is normal. No respiratory distress.     Breath sounds: Normal breath sounds.  Abdominal:     General: There is no distension.     Palpations: Abdomen  is soft.     Tenderness: There is no abdominal tenderness.  Musculoskeletal:        General: No tenderness.  Skin:    General: Skin is warm and dry.  Neurological:     Mental Status: She is alert.  Psychiatric:        Behavior: Behavior normal.        Thought Content: Thought content normal.      ED Treatments / Results  Labs (all labs ordered are listed, but only abnormal results are displayed) Labs Reviewed - No data to display  EKG None  Radiology No results found.  Procedures Procedures (including critical care time)  Medications Ordered in ED Medications - No data to display   Initial Impression / Assessment and Plan / ED Course  I have reviewed the triage vital signs and the nursing notes.  Pertinent labs & imaging results that were available during my care of the patient were reviewed by me and considered in my medical decision making (see chart for details).        38yF with dental abscess. No evidence of impending airway compromise or other emergent issue. Just prescribed abx. Tried to reassure. Return precautions discussed. Dental follow-up tomorrow.   Final Clinical Impressions(s) / ED Diagnoses   Final diagnoses:  Dental abscess  Anxiety    ED Discharge Orders    None       Virgel Manifold, MD 01/28/19 1635

## 2019-05-24 ENCOUNTER — Encounter (HOSPITAL_COMMUNITY): Payer: Self-pay

## 2019-05-24 ENCOUNTER — Emergency Department (HOSPITAL_COMMUNITY)
Admission: EM | Admit: 2019-05-24 | Discharge: 2019-05-24 | Disposition: A | Discharge status: Home / Self Care | Payer: Medicaid Other | Attending: Emergency Medicine | Admitting: Emergency Medicine

## 2019-05-24 ENCOUNTER — Other Ambulatory Visit: Payer: Self-pay

## 2019-05-24 DIAGNOSIS — M542 Cervicalgia: Secondary | ICD-10-CM

## 2019-05-24 DIAGNOSIS — F17290 Nicotine dependence, other tobacco product, uncomplicated: Secondary | ICD-10-CM | POA: Insufficient documentation

## 2019-05-24 LAB — POC URINE PREG, ED: Preg Test, Ur: NEGATIVE

## 2019-05-24 MED ORDER — LIDOCAINE 5 % EX PTCH: 1.0000 | MEDICATED_PATCH | CUTANEOUS | 0 refills | Status: DC

## 2019-05-24 MED ORDER — METHOCARBAMOL 500 MG PO TABS: 500.0000 mg | ORAL_TABLET | Freq: Two times a day (BID) | ORAL | 0 refills | Status: DC

## 2019-05-24 MED ORDER — NAPROXEN 500 MG PO TABS: 500.0000 mg | ORAL_TABLET | Freq: Two times a day (BID) | ORAL | 0 refills | Status: DC

## 2019-05-24 NOTE — ED Provider Notes (Signed)
[facility] EMERGENCY DEPARTMENT Provider Note   CSN: [id number] Arrival date & time: 05/24/19  1215   History   Chief Complaint Chief Complaint  Patient presents with  . Neck Pain   HPI Yesenia Gates is a 38 y.o. female with past medical history significant for anxiety, chronic back pain, DDD, headache presents for evaluation of left-sided neck pain.  Patient states she has had left-sided neck pain x3 days.  Patient states she recently got a new pillow and feeling she has woken up with a "kink" to her left side of her neck since then.  States pain is located to her paraspinal muscles to her left neck.  She denies any headache, vision changes, unilateral weakness, neck stiffness, paresthesias, dizziness, lightheadedness, chest pain, shortness of breath, nausea or vomiting, vision changes.  Has been taking ibuprofen at home without relief of her symptoms.  Denies family history of personal history of aneurysms or dissections.  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  Denies recent chiropractic treatments.  Denies additional aggravating or alleviating factors.  History obtained from patient and past medical records.  No Interpreter is used.     HPI  Past Medical History:  Diagnosis Date  . Abnormal Pap smear   . Anxiety   . Arthritis   . Chronic back pain   . DDD (degenerative disc disease), lumbar   . Headache   . Herniated disc   . Ovarian cyst   . Spondylisthesis     Patient Active Problem List   Diagnosis Date Noted  . Obesity (BMI 30-39.9) 02/20/2016  . Allergic rhinitis 07/17/2015  . Current smoker 07/17/2015  . Vitamin D deficiency 11/29/2014  . Hyperlipidemia 11/29/2014    Past Surgical History:  Procedure Laterality Date  . dilation and cutterage       OB History    Gravida  2   Para  1   Term  1   Preterm      AB      Living        SAB      TAB      Ectopic     Multiple      Live Births               Home Medications    Prior to Admission medications   Medication Sig Start Date End Date Taking? Authorizing Provider  clindamycin (CLEOCIN) 300 MG capsule Take 1 capsule (300 mg total) by mouth 4 (four) times daily. X 7 days 01/27/19   Milton Ferguson, MD  escitalopram (LEXAPRO) 10 MG tablet Take 1 tablet (10 mg total) by mouth daily. 04/04/16   Hassell Done, Mary-Margaret, FNP  ibuprofen (ADVIL) 800 MG tablet Take 1 tablet (800 mg total) by mouth every 8 (eight) hours as needed for moderate pain. 01/27/19   Milton Ferguson, MD  lidocaine (LIDODERM) 5 % Place 1 patch onto the skin daily. Remove & Discard patch within 12 hours or as directed by MD 05/24/19   Jonpaul Lumm A, PA-C  LORazepam (ATIVAN) 0.5 MG tablet Take 0.5 mg by mouth daily as needed for anxiety.     [provider]  methocarbamol (ROBAXIN) 500 MG tablet Take 1 tablet (500 mg total) by mouth 2 (two) times daily. 05/24/19   Rechy Bost A, PA-C  naproxen (NAPROSYN) 500 MG tablet Take 1 tablet (500 mg total) by mouth 2 (two)  times daily. 05/24/19   Midge Momon A, PA-C    Family History Family History  Problem Relation Age of Onset  . Diabetes Mother   . Cancer Mother        cervical  . Rheum arthritis Mother   . Diabetes Father   . Cancer Other   . Diabetes Sister   . Diabetes Brother   . Cancer Maternal Uncle        throat  . Stroke Maternal Uncle   . Cancer Maternal Grandmother        GYN  . Heart attack Maternal Grandfather   . Cancer Cousin        cervical  . Cancer Maternal Uncle        throat  . Cancer Cousin        breast  . Cancer Cousin        cervical    Social History Social History   Tobacco Use  . Smoking status: Current Every Day Smoker    Packs/day: 0.50    Types: Cigarettes, E-cigarettes  . Smokeless tobacco: Never Used  Substance Use Topics  . Alcohol use: Yes    Comment: occasionally  . Drug use: No     Allergies    Penicillins   Review of Systems Review of Systems  Constitutional: Negative.   HENT: Negative.   Respiratory: Negative.   Cardiovascular: Negative.   Gastrointestinal: Negative.   Genitourinary: Negative.   Musculoskeletal: Positive for neck pain. Negative for arthralgias, back pain, gait problem, joint swelling, myalgias and neck stiffness.  Skin: Negative.   Neurological: Negative.   All other systems reviewed and are negative.  Physical Exam Updated Vital Signs BP 106/66 (BP Location: Right Arm)   Pulse (!) 102   Temp 98.8 F (37.1 C) (Oral)   Resp 14   Ht 5\' 6"  (1.676 m)   Wt 108.9 kg   LMP 04/27/2019   SpO2 100%   BMI 38.74 kg/m   Physical Exam Vitals signs and nursing note reviewed.  Constitutional:      General: She is not in acute distress.    Appearance: She is well-developed. She is not ill-appearing or toxic-appearing.  HENT:     Head: Normocephalic and atraumatic.     Jaw: There is normal jaw occlusion.     Nose: Nose normal.     Mouth/Throat:     Lips: Pink.     Mouth: Mucous membranes are moist.     Pharynx: Oropharynx is clear.     Tonsils: No tonsillar exudate or tonsillar abscesses. 0 on the right. 0 on the left.  Eyes:     Extraocular Movements: Extraocular movements intact.     Pupils: Pupils are equal, round, and reactive to light.     Comments: No nystagmus.  EOMs intact.  PERRLA.  Neck:     Musculoskeletal: Full passive range of motion without pain, normal range of motion and neck supple.     Thyroid: No thyroid mass or thyromegaly.     Vascular: No carotid bruit or JVD.     Trachea: Trachea and phonation normal. No tracheal tenderness or tracheal deviation.     Meningeal: Brudzinski's sign and Kernig's sign absent.   Cardiovascular:     Rate and Rhythm: Normal rate.     Pulses: Normal pulses.          Radial pulses are 2+ on the right side and 2+ on the left side.     Heart sounds:  Normal heart sounds.  Pulmonary:     Effort:  Pulmonary effort is normal. No respiratory distress.     Breath sounds: Normal breath sounds and air entry.     Comments: Clear to auscultation bilateral without wheeze, rhonchi or rales. Chest:     Comments: No chest wall crepitus, deformity. Abdominal:     General: Bowel sounds are normal. There is no distension.     Palpations: Abdomen is soft.     Tenderness: There is no abdominal tenderness.  Musculoskeletal: Normal range of motion.     Right shoulder: Normal.     Left shoulder: Normal.     Cervical back: She exhibits spasm. She exhibits normal range of motion, no bony tenderness, no swelling, no edema, no deformity, no laceration, no pain and normal pulse.     Thoracic back: Normal.     Lumbar back: Normal.       Back:  Lymphadenopathy:     Cervical: No cervical adenopathy.     Comments: No cervical lymphadenopathy.  Skin:    General: Skin is warm and dry.     Capillary Refill: Capillary refill takes less than 2 seconds.     Comments: No vesicular changes.  No edema, erythema, ecchymosis or warmth.  Neurological:     General: No focal deficit present.     Mental Status: She is alert.     Cranial Nerves: Cranial nerves are intact.     Sensory: Sensation is intact.     Motor: Motor function is intact.     Coordination: Coordination is intact.     Gait: Gait is intact.     Deep Tendon Reflexes:     Reflex Scores:      Bicep reflexes are 2+ on the right side and 2+ on the left side.      Brachioradialis reflexes are 2+ on the right side and 2+ on the left side.    Comments: Negative finger-to-nose, negative Romberg.  Normal heel-to-shin. 5/5 strength bilateral upper extremities without difficulty.    ED Treatments / Results  Labs (all labs ordered are listed, but only abnormal results are displayed) Labs Reviewed  POC URINE PREG, ED    EKG None  Radiology No results found.  Procedures Procedures (including critical care time)  Medications Ordered in ED  Medications - No data to display  Initial Impression / Assessment and Plan / ED Course  I have reviewed the triage vital signs and the nursing notes.  Pertinent labs & imaging results that were available during my care of the patient were reviewed by me and considered in my medical decision making (see chart for details).  38 year old female appears otherwise well presents for evaluation of left paraspinal muscle tenderness after obtain new pillow.  Has been trying ibuprofen at home without difficulty.  Patient was tenderness over left paraspinal musculature into her left trapezius.  She has no stiffness or rigidity.  No headache, neurologic symptoms.  She has a nonfocal neurologic exam without deficits.  Denies sudden onset thunderclap headache.  Pain reproducible to palpation.  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  Low suspicion for cardiac etiology, vascular or neurologic cause.  I have low suspicion for CVA, dissection.  Will DC home with RICE for symptomatic management and robaxin. Patient to return for any new worsening symptoms.  Patient states she is also 2 days late on her menstrual cycle however her pregnancy test was negative here in the ED.  Tolerating p.o. intake without  difficulty.  No meningismus.   The patient has been appropriately medically screened and/or stabilized in the ED. I have low suspicion for any other emergent medical condition which would require further screening, evaluation or treatment in the ED or require inpatient management.  Patient is hemodynamically stable and in no acute distress.  Patient able to ambulate in department prior to ED.  Evaluation does not show acute pathology that would require ongoing or additional emergent interventions while in the emergency department or further inpatient treatment.  I have discussed the diagnosis with the patient and answered all questions.  Pain is been managed while in the emergency department and patient has no  further complaints prior to discharge.  Patient is comfortable with plan discussed in room and is stable for discharge at this time.  I have discussed strict return precautions for returning to the emergency department.  Patient was encouraged to follow-up with PCP/specialist refer to at discharge.     Final Clinical Impressions(s) / ED Diagnoses   Final diagnoses:  Neck pain    ED Discharge Orders         Ordered    naproxen (NAPROSYN) 500 MG tablet  2 times daily     05/24/19 1507    methocarbamol (ROBAXIN) 500 MG tablet  2 times daily     05/24/19 1507    lidocaine (LIDODERM) 5 %  Every 24 hours     05/24/19 1507           Chalene Treu A, PA-C 05/24/19 1606    Milton Ferguson, MD 05/27/19 1205

## 2019-05-24 NOTE — Discharge Instructions (Signed)
Take the medications as prescribed.  Return for any new or worsening symptoms.  You may also place heat to the area.  Your pregnancy test was negative.

## 2019-05-24 NOTE — ED Triage Notes (Signed)
Pt presents to ED with complaints of left side neck pain radiates to left shoulder x "few days." Pt denies N/V or fever.

## 2019-09-03 DIAGNOSIS — R5381 Other malaise: Secondary | ICD-10-CM | POA: Diagnosis not present

## 2019-09-03 DIAGNOSIS — R110 Nausea: Secondary | ICD-10-CM | POA: Diagnosis not present

## 2019-11-04 ENCOUNTER — Other Ambulatory Visit (HOSPITAL_COMMUNITY)
Admission: RE | Admit: 2019-11-04 | Discharge: 2019-11-04 | Disposition: A | Discharge status: Home / Self Care | Payer: PRIVATE HEALTH INSURANCE | Source: Ambulatory Visit | Attending: Medical-Surgical | Admitting: Medical-Surgical

## 2019-11-04 ENCOUNTER — Other Ambulatory Visit: Payer: Self-pay

## 2019-11-04 ENCOUNTER — Encounter: Payer: Self-pay | Admitting: Medical-Surgical

## 2019-11-04 ENCOUNTER — Ambulatory Visit (INDEPENDENT_AMBULATORY_CARE_PROVIDER_SITE_OTHER): Payer: PRIVATE HEALTH INSURANCE | Admitting: Medical-Surgical

## 2019-11-04 VITALS — BP 130/83 | HR 91 | Temp 98.5°F | Ht 66.0 in | Wt 205.8 lb

## 2019-11-04 DIAGNOSIS — F419 Anxiety disorder, unspecified: Secondary | ICD-10-CM

## 2019-11-04 DIAGNOSIS — Z124 Encounter for screening for malignant neoplasm of cervix: Secondary | ICD-10-CM

## 2019-11-04 DIAGNOSIS — Z1231 Encounter for screening mammogram for malignant neoplasm of breast: Secondary | ICD-10-CM

## 2019-11-04 DIAGNOSIS — Z0000 Encounter for general adult medical examination without abnormal findings: Secondary | ICD-10-CM | POA: Diagnosis not present

## 2019-11-04 DIAGNOSIS — Z113 Encounter for screening for infections with a predominantly sexual mode of transmission: Secondary | ICD-10-CM | POA: Insufficient documentation

## 2019-11-04 DIAGNOSIS — Z7689 Persons encountering health services in other specified circumstances: Secondary | ICD-10-CM

## 2019-11-04 NOTE — Progress Notes (Signed)
New Patient Office Visit  Subjective:  Patient ID: Yesenia Gates, female    DOB: [birth year]  Age: 39 y.o. MRN: [id number]  CC:  Chief Complaint  Patient presents with  . Establish Care  . Annual Exam    HPI Yesenia Gates presents to establish care.  Would like an annual physical exam today with blood work, Pap smear, and STI screening.  Anxiety-taking Lexapro 10 mg/day.  Also taking lorazepam 0.5 mg as needed for anxiety/panic attacks.  Reports this has not been prescribed to her in a long time but she does still have some medication left from her last prescription.  Increased life stresses since October of last year since her mom moved in and has some psychological issues.  Left neck discomfort-reports having a pinched nerve in September 2020 and since then has what feels like intermittent flutters in the left side of her neck.  She reports that there feels like there is something there but is unable to describe it.  Denies sore throat, dysphagia.   Past Medical History:  Diagnosis Date  . Abnormal Pap smear   . Anxiety   . Arthritis   . Chronic back pain   . DDD (degenerative disc disease), lumbar   . Headache   . Herniated disc   . Ovarian cyst   . Spondylisthesis     Past Surgical History:  Procedure Laterality Date  . dilation and cutterage      Family History  Problem Relation Age of Onset  . Diabetes Mother   . Cancer Mother        cervical  . Rheum arthritis Mother   . Diabetes Father   . Cancer Other   . Diabetes Sister   . Diabetes Brother   . Cancer Maternal Uncle        throat  . Stroke Maternal Uncle   . Cancer Maternal Grandmother        GYN  . Heart attack Maternal Grandfather   . Cancer Cousin        cervical  . Cancer Maternal Uncle        throat  . Cancer Cousin        breast  . Cancer Cousin        cervical    Social History   Socioeconomic History  . Marital status: Single    Spouse name: Not on file  . Number of  children: 1  . Years of education: 85  . Highest education level: Not on file  Occupational History    Comment: Yesenia Gates   Tobacco Use  . Smoking status: Current Every Day Smoker    Packs/day: 0.50    Types: Cigarettes  . Smokeless tobacco: Never Used  Substance and Sexual Activity  . Alcohol use: Yes    Comment: occasionally  . Drug use: No  . Sexual activity: Not Currently    Birth control/protection: Injection  Other Topics Concern  . Not on file  Social History Narrative   Lives at home with son, age 57   Caffeine use- use rarely   Social Determinants of Health   Financial Resource Strain:   . Difficulty of Paying Living Expenses: Not on file  Food Insecurity:   . Worried About Charity fundraiser in the Last Year: Not on file  . Ran Out of Food in the Last Year: Not on file  Transportation Needs:   . Lack of Transportation (Medical): Not on file  . Lack of  Transportation (Non-Medical): Not on file  Physical Activity:   . Days of Exercise per Week: Not on file  . Minutes of Exercise per Session: Not on file  Stress:   . Feeling of Stress : Not on file  Social Connections:   . Frequency of Communication with Friends and Family: Not on file  . Frequency of Social Gatherings with Friends and Family: Not on file  . Attends Religious Services: Not on file  . Active Member of Clubs or Organizations: Not on file  . Attends Archivist Meetings: Not on file  . Marital Status: Not on file  Intimate Partner Violence:   . Fear of Current or Ex-Partner: Not on file  . Emotionally Abused: Not on file  . Physically Abused: Not on file  . Sexually Abused: Not on file    ROS Review of Systems  Constitutional: Negative for chills, fatigue, fever and unexpected weight change.  HENT: Negative for congestion, rhinorrhea and sore throat.   Eyes: Negative for visual disturbance.  Respiratory: Negative for choking, chest tightness, shortness of breath and wheezing.     Cardiovascular: Negative for chest pain, palpitations and leg swelling.  Gastrointestinal: Negative for abdominal pain, constipation, diarrhea, nausea and vomiting.  Endocrine: Negative for cold intolerance, heat intolerance, polydipsia, polyphagia and polyuria.  Genitourinary: Negative for dysuria, frequency, hematuria and urgency.  Musculoskeletal: Positive for back pain and neck pain.  Skin: Negative for rash.  Neurological: Negative for dizziness, light-headedness and headaches.  Hematological: Negative for adenopathy. Does not bruise/bleed easily.  Psychiatric/Behavioral: Positive for sleep disturbance. Negative for self-injury and suicidal ideas. The patient is nervous/anxious.     Objective:   Today's Vitals: BP 130/83   Pulse 91   Temp 98.5 F (36.9 C) (Oral)   Ht 5\' 6"  (1.676 m)   Wt 205 lb 12.8 oz (93.4 kg)   LMP 11/02/2019   SpO2 96%   BMI 33.22 kg/m   Physical Exam Exam conducted with a chaperone present.  Constitutional:      General: She is not in acute distress.    Appearance: Normal appearance. She is not ill-appearing.  HENT:     Head: Normocephalic and atraumatic.     Right Ear: Tympanic membrane normal.     Left Ear: Tympanic membrane normal.     Nose: Nose normal.     Mouth/Throat:     Mouth: Mucous membranes are moist.     Pharynx: No oropharyngeal exudate or posterior oropharyngeal erythema.  Eyes:     Extraocular Movements: Extraocular movements intact.     Conjunctiva/sclera: Conjunctivae normal.     Pupils: Pupils are equal, round, and reactive to light.  Neck:     Thyroid: No thyromegaly.     Vascular: No carotid bruit or JVD.     Trachea: Trachea normal.      Comments: See image for area of left neck discomfort/fluttering.  No adenopathy, lumps, or bumps in this area.  No skin changes noted. Cardiovascular:     Rate and Rhythm: Normal rate and regular rhythm.     Pulses: Normal pulses.     Heart sounds: Normal heart sounds. No murmur. No  friction rub. No gallop.   Pulmonary:     Effort: Pulmonary effort is normal. No respiratory distress.     Breath sounds: Normal breath sounds. No wheezing.  Chest:    Abdominal:     General: Bowel sounds are normal. There is no distension.     Palpations: Abdomen  is soft.     Tenderness: There is no abdominal tenderness. There is no guarding.  Genitourinary:    General: Normal vulva.     Exam position: Lithotomy position.     Pubic Area: No rash.      Vagina: Normal.     Cervix: Normal.     Uterus: Normal.      Adnexa: Right adnexa normal and left adnexa normal.  Musculoskeletal:        General: Normal range of motion.     Cervical back: Normal range of motion and neck supple.  Lymphadenopathy:     Lower Body: No right inguinal adenopathy. No left inguinal adenopathy.  Skin:    General: Skin is warm and dry.  Neurological:     Mental Status: She is alert and oriented to person, place, and time.     Cranial Nerves: No cranial nerve deficit.  Psychiatric:        Mood and Affect: Mood normal.        Behavior: Behavior normal.        Thought Content: Thought content normal.        Judgment: Judgment normal.     Assessment & Plan:   Encounter to establish care  Annual physical exam Checking CBC, CMP, lipids.  Anxiety Continue Lexapro 10 mg daily and lorazepam 0.5 mg daily as needed.  Discussed reduction of life stressors and smoking cessation.  Left neck discomfort No identifiable cause of fluttering sensation and unable to reproduce in office.  Recommend conservative treatment using heat, ice, stretching, and ibuprofen as needed.  If this does not help recommended following up with Dr. Darene Lamer for further evaluation.  STI testing Checking for HIV antibody, RPR, hepatitis B, hepatitis C, gonorrhea, chlamydia, and trichomoniasis.  Encounter for cervical cancer screening Pap completed today.  Encounter for breast cancer screening Mammogram ordered.   Outpatient  Encounter Medications as of 11/04/2019  Medication Sig  . clindamycin (CLEOCIN) 300 MG capsule Take 1 capsule (300 mg total) by mouth 4 (four) times daily. X 7 days (Patient not taking: Reported on 11/04/2019)  . escitalopram (LEXAPRO) 10 MG tablet Take 1 tablet (10 mg total) by mouth daily. (Patient not taking: Reported on 11/04/2019)  . ibuprofen (ADVIL) 800 MG tablet Take 1 tablet (800 mg total) by mouth every 8 (eight) hours as needed for moderate pain. (Patient not taking: Reported on 11/04/2019)  . lidocaine (LIDODERM) 5 % Place 1 patch onto the skin daily. Remove & Discard patch within 12 hours or as directed by MD (Patient not taking: Reported on 11/04/2019)  . LORazepam (ATIVAN) 0.5 MG tablet Take 0.5 mg by mouth daily as needed for anxiety.   . methocarbamol (ROBAXIN) 500 MG tablet Take 1 tablet (500 mg total) by mouth 2 (two) times daily. (Patient not taking: Reported on 11/04/2019)  . naproxen (NAPROSYN) 500 MG tablet Take 1 tablet (500 mg total) by mouth 2 (two) times daily. (Patient not taking: Reported on 11/04/2019)   No facility-administered encounter medications on file as of 11/04/2019.    Follow-up: Return in about 1 year (around 11/03/2020).   Clearnce Sorrel, DNP, APRN, FNP-BC [facility]

## 2019-11-04 NOTE — Patient Instructions (Signed)

## 2019-11-08 ENCOUNTER — Other Ambulatory Visit: Payer: Self-pay | Admitting: Medical-Surgical

## 2019-11-08 ENCOUNTER — Other Ambulatory Visit: Payer: Self-pay

## 2019-11-08 ENCOUNTER — Emergency Department (HOSPITAL_COMMUNITY)
Admission: EM | Admit: 2019-11-08 | Discharge: 2019-11-08 | Disposition: A | Discharge status: Home / Self Care | Payer: PRIVATE HEALTH INSURANCE | Attending: Emergency Medicine | Admitting: Emergency Medicine

## 2019-11-08 ENCOUNTER — Telehealth: Payer: Self-pay

## 2019-11-08 ENCOUNTER — Encounter (HOSPITAL_COMMUNITY): Payer: Self-pay | Admitting: *Deleted

## 2019-11-08 DIAGNOSIS — F410 Panic disorder [episodic paroxysmal anxiety] without agoraphobia: Secondary | ICD-10-CM | POA: Insufficient documentation

## 2019-11-08 DIAGNOSIS — R457 State of emotional shock and stress, unspecified: Secondary | ICD-10-CM | POA: Diagnosis not present

## 2019-11-08 DIAGNOSIS — F17210 Nicotine dependence, cigarettes, uncomplicated: Secondary | ICD-10-CM | POA: Insufficient documentation

## 2019-11-08 DIAGNOSIS — R5381 Other malaise: Secondary | ICD-10-CM | POA: Diagnosis not present

## 2019-11-08 DIAGNOSIS — Z862 Personal history of diseases of the blood and blood-forming organs and certain disorders involving the immune mechanism: Secondary | ICD-10-CM

## 2019-11-08 LAB — CYTOLOGY - PAP
Chlamydia: NEGATIVE
Comment: NEGATIVE
Comment: NEGATIVE
Comment: NORMAL
Diagnosis: NEGATIVE
Neisseria Gonorrhea: NEGATIVE
Trichomonas: NEGATIVE

## 2019-11-08 MED ORDER — LORAZEPAM 1 MG PO TABS
2.0000 mg | ORAL_TABLET | Freq: Once | ORAL | Status: AC
  Administered 2019-11-08: 2 mg via ORAL
  Filled 2019-11-08: qty 2

## 2019-11-08 NOTE — Discharge Instructions (Signed)
You may take the Vistaril as prescribed by the urgent care yesterday to help with your anxiety until you are able to follow-up with a psychiatrist.  Please see the phone number above.  We gave you Ativan.  You should not drive or do anything important today that requires you to be awake as this medicine can be quite sedating.  Call your family doctor tomorrow for formal psychiatric referral but if you cannot be seen in an expedited fashion you may use the phone number above to call for local psychiatric evaluation here in [facility]  Emergency department for severe or worsening symptoms

## 2019-11-08 NOTE — Telephone Encounter (Signed)
I have ordered an iron panel as well as Vitamin B12 level and folate level. The hemoglobin from UC was just mildly low (low normal by UC ranges) without other indicators of possible cause of anemia. I highly suspect lower hemoglobin is related to heavy menses. Lab orders are still in from the appointment on 11/04/2019 to check for STIs and for other labs including lipids and rechecking the CBC. These are for [facility] so I'm not sure that they can be completed at a [facility] Urgent care facility. She will need to check before she goes to have them drawn.

## 2019-11-08 NOTE — ED Provider Notes (Signed)
[facility] EMERGENCY DEPARTMENT Provider Note   CSN: [id number] Arrival date & time: 11/08/19  1419     History Chief Complaint  Patient presents with  . Anxiety    Yesenia Gates is a 39 y.o. female.  HPI   This patient is a 39 year old female, she has a known history of anxiety and panic attacks which she has had since the age of 22.  She reports that recently she has had increasing amounts of anxiety, she has seen her family doctor for the first time a couple of days ago, she had a general work-up with an exam but was not given any medications or psychiatric referral.  Yesterday she went to the urgent care because of some side pain, commented on her anxiety and was restarted on her anxiety medications as well as given a prescription for Vistaril.  She did not have her Vistaril with her today when she had a panic attack after she was informed of some abnormal lab work including a low iron level.  She became very agitated and called EMS for a ride to the hospital to help with her severe anxiety.  She reports that this is not unusual for her however she has a feeling of a cold sensation all over her body which is different.  She denies any fevers vomiting or diarrhea.  She does note that she has been on an antibiotic since yesterday for her kidney infection as was diagnosed at the urgent care.  Again denies fevers, her side pain is better.  Past Medical History:  Diagnosis Date  . Abnormal Pap smear   . Anxiety   . Arthritis   . Chronic back pain   . DDD (degenerative disc disease), lumbar   . Headache   . Herniated disc   . Ovarian cyst   . Spondylisthesis     Patient Active Problem List   Diagnosis Date Noted  . Obesity (BMI 30-39.9) 02/20/2016  . Allergic rhinitis 07/17/2015  . Current smoker 07/17/2015  . Vitamin D deficiency 11/29/2014  . Hyperlipidemia 11/29/2014    Past Surgical History:  Procedure Laterality Date  . dilation and cutterage       OB History     Gravida  2   Para  1   Term  1   Preterm      AB      Living        SAB      TAB      Ectopic      Multiple      Live Births              Family History  Problem Relation Age of Onset  . Diabetes Mother   . Cancer Mother        cervical  . Rheum arthritis Mother   . Diabetes Father   . Cancer Other   . Diabetes Sister   . Diabetes Brother   . Cancer Maternal Uncle        throat  . Stroke Maternal Uncle   . Cancer Maternal Grandmother        GYN  . Heart attack Maternal Grandfather   . Cancer Cousin        cervical  . Cancer Maternal Uncle        throat  . Cancer Cousin        breast  . Cancer Cousin        cervical    Social History  Tobacco Use  . Smoking status: Current Every Day Smoker    Packs/day: 0.50    Types: Cigarettes  . Smokeless tobacco: Never Used  Substance Use Topics  . Alcohol use: Yes    Comment: occasionally  . Drug use: No    Home Medications Prior to Admission medications   Medication Sig Start Date End Date Taking? Authorizing Provider  escitalopram (LEXAPRO) 10 MG tablet Take 1 tablet (10 mg total) by mouth daily. Patient not taking: Reported on 11/04/2019 04/04/16   Chevis Pretty, FNP  LORazepam (ATIVAN) 0.5 MG tablet Take 0.5 mg by mouth daily as needed for anxiety.     [provider]    Allergies    Penicillins  Review of Systems   Review of Systems  All other systems reviewed and are negative.   Physical Exam Updated Vital Signs BP (!) 147/82 (BP Location: Right Arm)   Pulse (!) 117   Temp 98.6 F (37 C) (Oral)   Resp 18   Ht 1.676 m (5\' 6" )   Wt 93 kg   LMP 11/02/2019   SpO2 100%   BMI 33.09 kg/m   Physical Exam Vitals and nursing note reviewed.  Constitutional:      General: She is not in acute distress.    Appearance: She is well-developed.  HENT:     Head: Normocephalic and atraumatic.     Mouth/Throat:     Pharynx: No oropharyngeal exudate.  Eyes:     General:  No scleral icterus.       Right eye: No discharge.        Left eye: No discharge.     Conjunctiva/sclera: Conjunctivae normal.     Pupils: Pupils are equal, round, and reactive to light.  Neck:     Thyroid: No thyromegaly.     Vascular: No JVD.  Cardiovascular:     Rate and Rhythm: Regular rhythm. Tachycardia present.     Heart sounds: Normal heart sounds. No murmur. No friction rub. No gallop.   Pulmonary:     Effort: Pulmonary effort is normal. No respiratory distress.     Breath sounds: Normal breath sounds. No wheezing or rales.  Abdominal:     General: Bowel sounds are normal. There is no distension.     Palpations: Abdomen is soft. There is no mass.     Tenderness: There is no abdominal tenderness.  Musculoskeletal:        General: No tenderness. Normal range of motion.     Cervical back: Normal range of motion and neck supple.  Lymphadenopathy:     Cervical: No cervical adenopathy.  Skin:    General: Skin is warm and dry.     Findings: No erythema or rash.  Neurological:     General: No focal deficit present.     Mental Status: She is alert.     Coordination: Coordination normal.     Comments: The patient is pacing back and forth across the room, she is moving all 4 extremities, she has no facial droop, her speech is clear  Psychiatric:     Comments: The patient is moderately anxious, a little tearful, able to communicate to me the severity of her anxiety.  Does not appear suicidal     ED Results / Procedures / Treatments   Labs (all labs ordered are listed, but only abnormal results are displayed) Labs Reviewed - No data to display  EKG None  Radiology No results found.  Procedures Procedures (including  critical care time)  Medications Ordered in ED Medications  LORazepam (ATIVAN) tablet 2 mg (has no administration in time range)    ED Course  I have reviewed the triage vital signs and the nursing notes.  Pertinent labs & imaging results that were  available during my care of the patient were reviewed by me and considered in my medical decision making (see chart for details).    MDM Rules/Calculators/A&P                      The patient was given reassurance that Vistaril is a good option for acute anxiety, she will be given a dose of Ativan and discharged home.  She will have family member to come pick her up.  I do not think she is at risk for danger to herself or others, she is agreeable to the plan, I do not think that medical work-up is necessary at this time given this patient's significant severe underlying anxiety and panic attacks.  She does not appear to be hallucinating or responding to internal stimuli  Final Clinical Impression(s) / ED Diagnoses Final diagnoses:  Anxiety attack    Rx / DC Orders ED Discharge Orders    None       Noemi Chapel, MD 11/08/19 [phone]

## 2019-11-08 NOTE — ED Triage Notes (Signed)
Pt with just restarted on Lexapro the other day for anxiety and vistaril PRN but didn't have on her at the time.

## 2019-11-08 NOTE — Telephone Encounter (Signed)
Pt states she was seen at [facility] on 11/07/2019 and was told that her blood levels were low and she needed to have her iron checked. Pt states she was told to call us for the orders and that if we ordered the labs that she would be able to go to the lab at their office and have the blood drawn. Pt is requesting orders for these labs.

## 2019-11-08 NOTE — ED Triage Notes (Signed)
Pt with cooling sensation all over body? Anxiety or medication reaction, pt states she is on antibiotic currently for UTI

## 2019-11-09 NOTE — Telephone Encounter (Signed)
Pt aware that the lab orders have been entered and reminded that she has other lab orders pending that need to be drawn as well. I advised her to check with [facility] to see if they can draw [facility] labs prior to going over there. No further questions or concerns at this time.

## 2019-11-11 ENCOUNTER — Ambulatory Visit: Payer: PRIVATE HEALTH INSURANCE

## 2020-01-02 DIAGNOSIS — N760 Acute vaginitis: Secondary | ICD-10-CM | POA: Diagnosis not present

## 2020-01-02 DIAGNOSIS — B9689 Other specified bacterial agents as the cause of diseases classified elsewhere: Secondary | ICD-10-CM | POA: Diagnosis not present

## 2020-03-02 ENCOUNTER — Ambulatory Visit: Payer: PRIVATE HEALTH INSURANCE | Admitting: Medical-Surgical

## 2020-03-29 DIAGNOSIS — R450 Nervousness: Secondary | ICD-10-CM | POA: Diagnosis not present

## 2020-03-29 DIAGNOSIS — R5381 Other malaise: Secondary | ICD-10-CM | POA: Diagnosis not present

## 2020-03-29 DIAGNOSIS — R457 State of emotional shock and stress, unspecified: Secondary | ICD-10-CM | POA: Diagnosis not present

## 2020-03-30 ENCOUNTER — Emergency Department (HOSPITAL_COMMUNITY)
Admission: EM | Admit: 2020-03-30 | Discharge: 2020-03-31 | Discharge status: Against Medical Advice | Payer: PRIVATE HEALTH INSURANCE

## 2020-03-30 ENCOUNTER — Other Ambulatory Visit: Payer: Self-pay

## 2020-04-07 DIAGNOSIS — R000 Tachycardia, unspecified: Secondary | ICD-10-CM | POA: Diagnosis not present

## 2020-04-07 DIAGNOSIS — R52 Pain, unspecified: Secondary | ICD-10-CM | POA: Diagnosis not present

## 2020-04-07 DIAGNOSIS — G4489 Other headache syndrome: Secondary | ICD-10-CM | POA: Diagnosis not present

## 2020-04-07 DIAGNOSIS — R5381 Other malaise: Secondary | ICD-10-CM | POA: Diagnosis not present

## 2020-04-07 DIAGNOSIS — R457 State of emotional shock and stress, unspecified: Secondary | ICD-10-CM | POA: Diagnosis not present

## 2020-04-16 ENCOUNTER — Emergency Department (HOSPITAL_COMMUNITY)
Admission: EM | Admit: 2020-04-16 | Discharge: 2020-04-17 | Disposition: A | Discharge status: Home / Self Care | Payer: No Typology Code available for payment source | Attending: Emergency Medicine | Admitting: Emergency Medicine

## 2020-04-16 ENCOUNTER — Encounter (HOSPITAL_COMMUNITY): Payer: Self-pay

## 2020-04-16 ENCOUNTER — Other Ambulatory Visit: Payer: Self-pay

## 2020-04-16 DIAGNOSIS — R110 Nausea: Secondary | ICD-10-CM | POA: Diagnosis not present

## 2020-04-16 DIAGNOSIS — R5383 Other fatigue: Secondary | ICD-10-CM | POA: Diagnosis not present

## 2020-04-16 DIAGNOSIS — R457 State of emotional shock and stress, unspecified: Secondary | ICD-10-CM | POA: Diagnosis not present

## 2020-04-16 DIAGNOSIS — R000 Tachycardia, unspecified: Secondary | ICD-10-CM | POA: Diagnosis not present

## 2020-04-16 DIAGNOSIS — R55 Syncope and collapse: Secondary | ICD-10-CM | POA: Diagnosis not present

## 2020-04-16 DIAGNOSIS — R0689 Other abnormalities of breathing: Secondary | ICD-10-CM | POA: Diagnosis not present

## 2020-04-16 DIAGNOSIS — I959 Hypotension, unspecified: Secondary | ICD-10-CM | POA: Diagnosis not present

## 2020-04-16 DIAGNOSIS — G4489 Other headache syndrome: Secondary | ICD-10-CM | POA: Diagnosis not present

## 2020-04-16 DIAGNOSIS — F17210 Nicotine dependence, cigarettes, uncomplicated: Secondary | ICD-10-CM | POA: Diagnosis not present

## 2020-04-16 DIAGNOSIS — R5381 Other malaise: Secondary | ICD-10-CM | POA: Diagnosis not present

## 2020-04-16 DIAGNOSIS — R42 Dizziness and giddiness: Secondary | ICD-10-CM | POA: Diagnosis present

## 2020-04-16 DIAGNOSIS — R519 Headache, unspecified: Secondary | ICD-10-CM | POA: Diagnosis not present

## 2020-04-16 LAB — POC URINE PREG, ED: Preg Test, Ur: NEGATIVE

## 2020-04-16 MED ORDER — SODIUM CHLORIDE 0.9 % IV BOLUS
1000.0000 mL | Freq: Once | INTRAVENOUS | Status: AC
  Administered 2020-04-16: 1000 mL via INTRAVENOUS

## 2020-04-16 NOTE — ED Provider Notes (Signed)
[facility] EMERGENCY DEPARTMENT Provider Note   CSN: [id number] Arrival date & time: 04/16/20  2015     History Chief Complaint  Patient presents with   Dizziness   Weakness    Yesenia Gates is a 39 y.o. female.  Patient presents to the emergency department with multiple complaints.  Patient reports that symptoms have been ongoing since July 17.  She has been seen at multiple physicians offices for these complaints.  Patient reports that she has been experiencing frontal headache and generalized weakness.  She was told that she was having tension headaches at first and then was told that it might be sinus infection.  She has been on nasal spray, prednisone and antibiotics.  Patient reports that she felt better yesterday so she thought she could go outside today.  She went outside and sat in the sun, became hot and started to feel like her head was hurting again.  She then got very weak.  She reports that whenever she gets up and stands or walks she feels like she is going to pass out.  Patient reports that she had a CAT scan of her head performed as an outpatient on July 30.        Past Medical History:  Diagnosis Date   Abnormal Pap smear    Anxiety    Arthritis    Chronic back pain    DDD (degenerative disc disease), lumbar    Headache    Herniated disc    Ovarian cyst    Spondylisthesis     Patient Active Problem List   Diagnosis Date Noted   Obesity (BMI 30-39.9) 02/20/2016   Allergic rhinitis 07/17/2015   Current smoker 07/17/2015   Vitamin D deficiency 11/29/2014   Hyperlipidemia 11/29/2014    Past Surgical History:  Procedure Laterality Date   dilation and cutterage       OB History    Gravida  2   Para  1   Term  1   Preterm      AB      Living        SAB      TAB      Ectopic      Multiple      Live Births              Family History  Problem Relation Age of Onset   Diabetes Mother    Cancer Mother          cervical   Rheum arthritis Mother    Diabetes Father    Cancer Other    Diabetes Sister    Diabetes Brother    Cancer Maternal Uncle        throat   Stroke Maternal Uncle    Cancer Maternal Grandmother        GYN   Heart attack Maternal Grandfather    Cancer Cousin        cervical   Cancer Maternal Uncle        throat   Cancer Cousin        breast   Cancer Cousin        cervical    Social History   Tobacco Use   Smoking status: Current Every Day Smoker    Packs/day: 0.50    Types: Cigarettes   Smokeless tobacco: Never Used  Vaping Use   Vaping Use: Never used  Substance Use Topics   Alcohol use: Yes    Comment: occasionally  Drug use: No    Home Medications Prior to Admission medications   Medication Sig Start Date End Date Taking? Authorizing Provider  escitalopram (LEXAPRO) 10 MG tablet Take 1 tablet (10 mg total) by mouth daily. Patient not taking: Reported on 11/04/2019 04/04/16   Chevis Pretty, FNP  LORazepam (ATIVAN) 0.5 MG tablet Take 0.5 mg by mouth daily as needed for anxiety.     [provider]    Allergies    Penicillins  Review of Systems   Review of Systems  Constitutional: Positive for fatigue.  Neurological: Positive for dizziness and headaches.  All other systems reviewed and are negative.   Physical Exam Updated Vital Signs BP 129/79    Pulse 89    Temp 98.7 F (37.1 C) (Oral)    Resp 20    Ht 5\' 6"  (1.676 m)    Wt 94.3 kg    SpO2 98%    BMI 33.57 kg/m   Physical Exam Vitals and nursing note reviewed.  Constitutional:      General: She is not in acute distress.    Appearance: Normal appearance. She is well-developed.  HENT:     Head: Normocephalic and atraumatic.     Right Ear: Hearing normal.     Left Ear: Hearing normal.     Nose: Nose normal.  Eyes:     Conjunctiva/sclera: Conjunctivae normal.     Pupils: Pupils are equal, round, and reactive to light.  Cardiovascular:     Rate and  Rhythm: Regular rhythm.     Heart sounds: S1 normal and S2 normal. No murmur heard.  No friction rub. No gallop.   Pulmonary:     Effort: Pulmonary effort is normal. No respiratory distress.     Breath sounds: Normal breath sounds.  Chest:     Chest wall: No tenderness.  Abdominal:     General: Bowel sounds are normal.     Palpations: Abdomen is soft.     Tenderness: There is no abdominal tenderness. There is no guarding or rebound. Negative signs include Murphy's sign and McBurney's sign.     Hernia: No hernia is present.  Musculoskeletal:        General: Normal range of motion.     Cervical back: Normal range of motion and neck supple.  Skin:    General: Skin is warm and dry.     Findings: No rash.  Neurological:     Mental Status: She is alert and oriented to person, place, and time.     GCS: GCS eye subscore is 4. GCS verbal subscore is 5. GCS motor subscore is 6.     Cranial Nerves: No cranial nerve deficit.     Sensory: No sensory deficit.     Coordination: Coordination normal.  Psychiatric:        Speech: Speech normal.        Behavior: Behavior normal.        Thought Content: Thought content normal.     ED Results / Procedures / Treatments   Labs (all labs ordered are listed, but only abnormal results are displayed) Labs Reviewed  CBC WITH DIFFERENTIAL/PLATELET - Abnormal; Notable for the following components:      Result Value   WBC 11.1 (*)    Hemoglobin 11.5 (*)    RDW 16.8 (*)    Neutro Abs 9.2 (*)    Abs Immature Granulocytes 0.09 (*)    All other components within normal limits  BASIC METABOLIC PANEL -  Abnormal; Notable for the following components:   CO2 20 (*)    Glucose, Bld 118 (*)    Calcium 8.6 (*)    All other components within normal limits  URINALYSIS, ROUTINE W REFLEX MICROSCOPIC - Abnormal; Notable for the following components:   Color, Urine STRAW (*)    Hgb urine dipstick SMALL (*)    Bacteria, UA RARE (*)    All other components within  normal limits  POC URINE PREG, ED    EKG None  Radiology No results found.  Procedures Procedures (including critical care time)  Medications Ordered in ED Medications  sodium chloride 0.9 % bolus 1,000 mL (0 mLs Intravenous Stopped 04/17/20 0135)    ED Course  I have reviewed the triage vital signs and the nursing notes.  Pertinent labs & imaging results that were available during my care of the patient were reviewed by me and considered in my medical decision making (see chart for details).    MDM Rules/Calculators/A&P                          Patient presents to the emergency department for evaluation of recurrent headache.  Patient reports that symptoms have been ongoing for a couple of weeks.  Previous 2 visits to urgent care have been reviewed.  Symptoms were similar to those reported today.  She has been treated with antibiotics and prednisone as an outpatient for possible sinus headaches.  She does not have any focal neurologic findings today.  Patient reports that she had a CAT scan on July 30 but I do not have these results.  Does not seem reasonable to repeat the CAT scan today without any focal neurologic findings.  No red flag symptoms to be concerned about intracranial pressure or subarachnoid hemorrhage.  Patient indicates worsening of her symptoms today after being outside in the heat.  Lab work is normal.  She was given IV fluids.  At this point it is unclear what is causing her recurrent headaches, dizzy spells.  There does not appear to be an acute emergent condition that requires hospitalization but can be followed up as an outpatient.  I will recommend neurology follow-up for the patient.  Final Clinical Impression(s) / ED Diagnoses Final diagnoses:  Bad headache    Rx / DC Orders ED Discharge Orders    None       Denaja Verhoeven, Gwenyth Allegra, MD 04/17/20 [phone]

## 2020-04-16 NOTE — ED Triage Notes (Signed)
Pt to er via ems, pt states that she is here for dizziness, a pressure headache and also feeling faint.    Pt states that she has been to urgent care for her headache and was told it was a tension headache and the other doctor told her that it was a sinus headache.  States that she was put on an abx and finished it last week.  States that she was also given some ativan for her anxiety. Prednisone and some nasal spray.  States that she thought that she was feeling better so she went outside today and after being out in the heat she started having the headache and dizziness again.

## 2020-04-17 DIAGNOSIS — R519 Headache, unspecified: Secondary | ICD-10-CM | POA: Diagnosis not present

## 2020-04-17 LAB — BASIC METABOLIC PANEL
Anion gap: 11 (ref 5–15)
BUN: 12 mg/dL (ref 6–20)
CO2: 20 mmol/L — ABNORMAL LOW (ref 22–32)
Calcium: 8.6 mg/dL — ABNORMAL LOW (ref 8.9–10.3)
Chloride: 108 mmol/L (ref 98–111)
Creatinine, Ser: 0.72 mg/dL (ref 0.44–1.00)
GFR calc Af Amer: >60 mL/min (ref >60)
GFR calc non Af Amer: >60 mL/min (ref >60)
Glucose, Bld: 118 mg/dL — ABNORMAL HIGH (ref 70–99)
Potassium: 3.8 mmol/L (ref 3.5–5.1)
Sodium: 139 mmol/L (ref 135–145)

## 2020-04-17 LAB — URINALYSIS, ROUTINE W REFLEX MICROSCOPIC
Bilirubin Urine: NEGATIVE
Glucose, UA: NEGATIVE mg/dL
Ketones, ur: NEGATIVE mg/dL
Leukocytes,Ua: NEGATIVE
Nitrite: NEGATIVE
Protein, ur: NEGATIVE mg/dL
Specific Gravity, Urine: 1.014 (ref 1.005–1.030)
pH: 6 (ref 5.0–8.0)

## 2020-04-17 LAB — CBC WITH DIFFERENTIAL/PLATELET
Abs Immature Granulocytes: 0.09 10*3/uL — ABNORMAL HIGH (ref 0.00–0.07)
Basophils Absolute: 0 10*3/uL (ref 0.0–0.1)
Basophils Relative: 0 %
Eosinophils Absolute: 0 10*3/uL (ref 0.0–0.5)
Eosinophils Relative: 0 %
HCT: 36.2 % (ref 36.0–46.0)
Hemoglobin: 11.5 g/dL — ABNORMAL LOW (ref 12.0–15.0)
Immature Granulocytes: 1 %
Lymphocytes Relative: 14 %
Lymphs Abs: 1.6 10*3/uL (ref 0.7–4.0)
MCH: 28.3 pg (ref 26.0–34.0)
MCHC: 31.8 g/dL (ref 30.0–36.0)
MCV: 89.2 fL (ref 80.0–100.0)
Monocytes Absolute: 0.3 10*3/uL (ref 0.1–1.0)
Monocytes Relative: 2 %
Neutro Abs: 9.2 10*3/uL — ABNORMAL HIGH (ref 1.7–7.7)
Neutrophils Relative %: 83 %
Platelets: 269 10*3/uL (ref 150–400)
RBC: 4.06 MIL/uL (ref 3.87–5.11)
RDW: 16.8 % — ABNORMAL HIGH (ref 11.5–15.5)
WBC: 11.1 10*3/uL — ABNORMAL HIGH (ref 4.0–10.5)
nRBC: 0 % (ref 0.0–0.2)

## 2020-04-17 MED ORDER — KETOROLAC TROMETHAMINE 30 MG/ML IJ SOLN
30.0000 mg | Freq: Once | INTRAMUSCULAR | Status: DC
Start: 2020-04-17 — End: 2020-04-17
  Filled 2020-04-17: qty 1

## 2020-04-17 NOTE — ED Notes (Signed)
Patient to restroom via wheelchair. Urine specimen obtained.

## 2020-04-17 NOTE — Discharge Instructions (Addendum)
It is not clear what is causing your frequent headaches, dizziness and feeling like you are going to pass out.  There does not appear to be a life-threatening condition causing the symptoms but they certainly need to be further worked up.  At this point, neurology consultation seems to be the next step to further evaluate your symptoms.  Please call one of the listed groups to schedule follow-up.

## 2020-04-17 NOTE — ED Notes (Signed)
Ortho v/s completed. RN to pt to give toradol. Pt declined saying she was ready to leave and wanted IV taken out. IV removed. Pt also stated she wanted to ask edp questions. edp notified. edp dispensed paperwork and will address pt after rounds. Pt notified and aware.

## 2020-04-25 ENCOUNTER — Ambulatory Visit (INDEPENDENT_AMBULATORY_CARE_PROVIDER_SITE_OTHER): Payer: PRIVATE HEALTH INSURANCE | Admitting: Diagnostic Neuroimaging

## 2020-04-25 ENCOUNTER — Ambulatory Visit
Admission: RE | Admit: 2020-04-25 | Discharge: 2020-04-25 | Disposition: A | Discharge status: Home / Self Care | Payer: PRIVATE HEALTH INSURANCE | Source: Ambulatory Visit | Attending: Medical-Surgical | Admitting: Medical-Surgical

## 2020-04-25 ENCOUNTER — Encounter: Payer: Self-pay | Admitting: Diagnostic Neuroimaging

## 2020-04-25 ENCOUNTER — Encounter (HOSPITAL_COMMUNITY): Payer: Self-pay | Admitting: Emergency Medicine

## 2020-04-25 ENCOUNTER — Emergency Department (HOSPITAL_COMMUNITY): Payer: No Typology Code available for payment source

## 2020-04-25 ENCOUNTER — Other Ambulatory Visit: Payer: Self-pay

## 2020-04-25 ENCOUNTER — Emergency Department (HOSPITAL_COMMUNITY)
Admission: EM | Admit: 2020-04-25 | Discharge: 2020-04-25 | Disposition: A | Discharge status: Home / Self Care | Payer: No Typology Code available for payment source | Attending: Emergency Medicine | Admitting: Emergency Medicine

## 2020-04-25 VITALS — BP 130/73 | HR 107 | Ht 66.0 in | Wt 203.0 lb

## 2020-04-25 DIAGNOSIS — Z1231 Encounter for screening mammogram for malignant neoplasm of breast: Secondary | ICD-10-CM

## 2020-04-25 DIAGNOSIS — R519 Headache, unspecified: Secondary | ICD-10-CM

## 2020-04-25 DIAGNOSIS — Z5321 Procedure and treatment not carried out due to patient leaving prior to being seen by health care provider: Secondary | ICD-10-CM | POA: Diagnosis not present

## 2020-04-25 DIAGNOSIS — G43009 Migraine without aura, not intractable, without status migrainosus: Secondary | ICD-10-CM | POA: Diagnosis not present

## 2020-04-25 MED ORDER — TOPIRAMATE 50 MG PO TABS
50.0000 mg | ORAL_TABLET | Freq: Two times a day (BID) | ORAL | 12 refills | Status: DC
Start: 2020-04-25 — End: 2020-05-17

## 2020-04-25 MED ORDER — NURTEC 75 MG PO TBDP: 75.0000 mg | ORAL_TABLET | Freq: Every day | ORAL | 12 refills | Status: DC | PRN

## 2020-04-25 NOTE — Patient Instructions (Addendum)
  WORSENING HEADACHES (fam hx of aneurysm) - check MRI brain / MRA head   MIGRAINE PREVENTION  LIFESTYLE CHANGES -Stop or avoid smoking -Decrease or avoid caffeine / alcohol -Eat and sleep on a regular schedule -Exercise several times per week - start topiramate 50mg  at bedtime; after 1-2 weeks increase to 50mg  twice a day; drink plenty of water  MIGRAINE RESCUE  - ibuprofen, tylenol as needed - consider rizatriptan (Maxalt) 10mg  as needed for breakthrough headache; may repeat x 1 after 2 hours; max 2 tabs per day or 8 per month - rimegepant (Nurtec) 75mg  as needed for breakthrough headache; max 8 per month

## 2020-04-25 NOTE — ED Triage Notes (Signed)
Pt here c/o headache. Pt seen recently for the same, and has an upcoming appt with neurology.

## 2020-04-25 NOTE — Progress Notes (Signed)
[facility]  PATIENT: Yesenia Gates DOB: [birth year]  REFERRING CLINICIAN:  HISTORY FROM: patient  REASON FOR VISIT: new consult     HISTORICAL  CHIEF COMPLAINT:  Chief Complaint  Patient presents with  . Headache    rm 6 New Pt, ED referral, "went to ED yesterday, had CT head done; headaches since 03/25/20-I was upset that day-felt like a heartbeat in R temple; Tylenol, Ibuprofen, Nurtec, Toradol injection, OTC saline spray not helpful; saw eye dr Nolene Ebbs this year- everything looked good"  . Dizziness    HISTORY OF PRESENT ILLNESS:   UPDATE (04/25/20, VRP): Since last visit, doing well until last 6 months (increasing anxiety, dizziness, headaches). On 03/25/20, became upset, then had rigth sided throbbing HA, nausea, scalp crawling sensations. Dizziness, weakness, anxiety have worsened. Symptoms are constant. Severity is moderate. No alleviating or aggravating factors. Tolerating meds.    UPDATE 03/07/17: Since last visit, HAs continued. 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 Now referred here for evaluation. No tinnitus. Some right TMJ pressure. Daily HA (lasting minutes at a time). No transient visual obscurations. No snoring. Has insomnia.   PRIOR HPI (05/27/16): 39 year old female here for evaluation of headaches. Patient describes intermittent sharp pins and needles sensation in her head for the past one year. Symptoms can occur on a daily basis lasting for seconds at a time. They may occur throughout the day. This first started when she rose up from bed and felt the sensation on the top and right side of her head. Rarely this happened on the left side. No nausea or vomiting. Sometimes she has phonophobia with these episodes. Triggering factors include alcohol use. Patient averaging about 6 hours of sleep at night. Patient never had this problem before. Patient also has depression, anxiety,  racing thoughts, sleepiness, and has been on Lexapro for the past 4 months. Patient has also had 50 pound weight gain over the past 9 months.   REVIEW OF SYSTEMS: Full 14 system review of systems performed and negative with exception of: headaches depression anxiety.    ALLERGIES: Allergies  Allergen Reactions  . Penicillins Rash    HOME MEDICATIONS: Outpatient Medications Prior to Visit  Medication Sig Dispense Refill  . escitalopram (LEXAPRO) 10 MG tablet Take 1 tablet (10 mg total) by mouth daily. 90 tablet 3  . LORazepam (ATIVAN) 0.5 MG tablet Take 0.5 mg by mouth daily as needed for anxiety.     . Multiple Vitamin (MULTIVITAMIN) tablet Take 1 tablet by mouth daily.    . Rimegepant Sulfate (NURTEC) 75 MG TBDP Take 75 mg by mouth as needed.     No facility-administered medications prior to visit.    PAST MEDICAL HISTORY: Past Medical History:  Diagnosis Date  . Abnormal Pap smear   . Anxiety   . Arthritis   . Chronic back pain   . DDD (degenerative disc disease), lumbar   . Headache   . Herniated disc   . Ovarian cyst   . Spondylisthesis     PAST SURGICAL HISTORY: Past Surgical History:  Procedure Laterality Date  . dilation and cutterage      FAMILY HISTORY: Family History  Problem Relation Age of Onset  . Diabetes Mother   . Cancer Mother        cervical  . Rheum arthritis Mother   . Diabetes Father   . Cancer Other   . Diabetes Sister   .  Diabetes Brother   . Cancer Maternal Uncle        throat  . Stroke Maternal Uncle   . Cancer Maternal Grandmother        GYN  . Heart attack Maternal Grandfather   . Cancer Cousin        cervical  . Cancer Maternal Uncle        throat  . Cancer Cousin        breast  . Cancer Cousin        cervical    SOCIAL HISTORY:  Social History   Socioeconomic History  . Marital status: Single    Spouse name: Not on file  . Number of children: 1  . Years of education: 70  . Highest education level: Not on file    Occupational History    Comment: [name]   Tobacco Use  . Smoking status: Current Every Day Smoker    Packs/day: 1.00    Types: Cigarettes  . Smokeless tobacco: Never Used  Vaping Use  . Vaping Use: Never used  Substance and Sexual Activity  . Alcohol use: Yes    Comment: occasionally  . Drug use: No  . Sexual activity: Not Currently    Birth control/protection: Injection  Other Topics Concern  . Not on file  Social History Narrative   Lives at home with son   Caffeine use- use rarely   Social Determinants of Health   Financial Resource Strain:   . Difficulty of Paying Living Expenses:   Food Insecurity:   . Worried About Charity fundraiser in the Last Year:   . Arboriculturist in the Last Year:   Transportation Needs:   . Film/video editor (Medical):   Marland Kitchen Lack of Transportation (Non-Medical):   Physical Activity:   . Days of Exercise per Week:   . Minutes of Exercise per Session:   Stress:   . Feeling of Stress :   Social Connections:   . Frequency of Communication with Friends and Family:   . Frequency of Social Gatherings with Friends and Family:   . Attends Religious Services:   . Active Member of Clubs or Organizations:   . Attends Archivist Meetings:   Marland Kitchen Marital Status:   Intimate Partner Violence:   . Fear of Current or Ex-Partner:   . Emotionally Abused:   Marland Kitchen Physically Abused:   . Sexually Abused:      PHYSICAL EXAM  GENERAL EXAM/CONSTITUTIONAL: Vitals:  Vitals:   04/25/20 1106  BP: 130/73  Pulse: (!) 107  Weight: 203 lb (92.1 kg)  Height: 5\' 6"  (1.676 m)     Body mass index is 32.77 kg/m. Wt Readings from Last 3 Encounters:  04/25/20 203 lb (92.1 kg)  04/25/20 206 lb (93.4 kg)  04/16/20 208 lb (94.3 kg)     Patient is in no distress; well developed, nourished and groomed; neck is supple  CARDIOVASCULAR:  Examination of carotid arteries is normal; no carotid bruits  Regular rate and rhythm, no  murmurs  Examination of peripheral vascular system by observation and palpation is normal  EYES:  Ophthalmoscopic exam of optic discs and posterior segments is normal; no papilledema or hemorrhages  No exam data present  MUSCULOSKELETAL:  Gait, strength, tone, movements noted in Neurologic exam below  NEUROLOGIC: MENTAL STATUS:  No flowsheet data found.  awake, alert, oriented to person, place and time  recent and remote memory intact  normal attention and concentration  language fluent, comprehension intact, naming intact  fund of knowledge appropriate  CRANIAL NERVE:   2nd - no papilledema on fundoscopic exam  2nd, 3rd, 4th, 6th - pupils equal and reactive to light, visual fields full to confrontation, extraocular muscles intact, no nystagmus  5th - facial sensation symmetric  7th - facial strength symmetric  8th - hearing intact  9th - palate elevates symmetrically, uvula midline  11th - shoulder shrug symmetric  12th - tongue protrusion midline  MOTOR:   normal bulk and tone, full strength in the BUE, BLE  SENSORY:   normal and symmetric to light touch  COORDINATION:   finger-nose-finger, fine finger movements normal  REFLEXES:   deep tendon reflexes present and symmetric  GAIT/STATION:   narrow based gait       DIAGNOSTIC DATA (LABS, IMAGING, TESTING) - I reviewed patient records, labs, notes, testing and imaging myself where available.  Lab Results  Component Value Date   WBC 11.1 (H) 04/17/2020   HGB 11.5 (L) 04/17/2020   HCT 36.2 04/17/2020   MCV 89.2 04/17/2020   PLT 269 04/17/2020      Component Value Date/Time   NA 139 04/17/2020 0029   NA 142 12/08/2015 1449   K 3.8 04/17/2020 0029   CL 108 04/17/2020 0029   CO2 20 (L) 04/17/2020 0029   GLUCOSE 118 (H) 04/17/2020 0029   BUN 12 04/17/2020 0029   BUN 8 12/08/2015 1449   CREATININE 0.72 04/17/2020 0029   CALCIUM 8.6 (L) 04/17/2020 0029   PROT 6.6 12/08/2015 1449    ALBUMIN 4.5 12/08/2015 1449   AST 9 12/08/2015 1449   ALT 14 12/08/2015 1449   ALKPHOS 97 12/08/2015 1449   BILITOT 0.3 12/08/2015 1449   GFRNONAA >60 04/17/2020 0029   GFRAA >60 04/17/2020 0029   Lab Results  Component Value Date   CHOL 105 12/08/2015   HDL 35 (L) 12/08/2015   LDLCALC 51 12/08/2015   TRIG 94 12/08/2015   CHOLHDL 3.0 12/08/2015   No results found for: HGBA1C Lab Results  Component Value Date   VITAMINB12 362 12/08/2015   Lab Results  Component Value Date   TSH 1.370 05/20/2014    July 2017 MRI brain [place], [place]; [place]): normal per patient report  03/23/17 MRI brain  - normal  04/16/17 LP - Successful fluoroscopic guided lumbar puncture. Opening pressure of 18 cm water.  04/25/20 CT head [I reviewed images myself and agree with interpretation. -VRP]  - Negative noncontrast head CT.     ASSESSMENT AND PLAN  39 y.o. year old female here with new onset pain and headache since 2016. Had normal MRI in 2017. 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 Symptoms are new since last MRI, therefore patient needs further workup.   Dx:   1. Migraine without aura and without status migrainosus, not intractable      PLAN:  WORSENING HEADACHES (fam hx of aneurysm) - check MRI brain / MRA head  - follow up with ophthalmology (h/o papilledema)  MIGRAINE PREVENTION  LIFESTYLE CHANGES -Stop or avoid smoking -Decrease or avoid caffeine / alcohol -Eat and sleep on a regular schedule -Exercise several times per week - start topiramate 50mg  at bedtime; after 1-2 weeks increase to 50mg  twice a day; drink plenty of water  May consider 2nd line - erenumab (Aimovig) 70mg  monthly (may increase to 140mg  monthly) - fremanezumab (Ajovy) 225mg  monthly (or 675mg  every 3 months) - galazanezumab (  Emgality) 240mg  loading dose; then 120mg  monthly   MIGRAINE RESCUE  - ibuprofen, tylenol as needed - consider  rizatriptan (Maxalt) 10mg  as needed for breakthrough headache; may repeat x 1 after 2 hours; max 2 tabs per day or 8 per month - rimegepant (Nurtec) 75mg  as needed for breakthrough headache; max 8 per month  Orders Placed This Encounter  Procedures  . MR BRAIN W WO CONTRAST  . MR ANGIO HEAD WO CONTRAST   Meds ordered this encounter  Medications  . topiramate (TOPAMAX) 50 MG tablet    Sig: Take 1 tablet (50 mg total) by mouth 2 (two) times daily.    Dispense:  60 tablet    Refill:  12  . Rimegepant Sulfate (NURTEC) 75 MG TBDP    Sig: Take 75 mg by mouth daily as needed.    Dispense:  8 tablet    Refill:  12   Return in about 6 months (around 10/26/2020) for with NP (Amy Lomax).    Penni Bombard, MD 3/00/5110, 21:11 AM Certified in Neurology, Neurophysiology and Neuroimaging  [facility] [address], [place], [place] [address] [phone]

## 2020-04-26 ENCOUNTER — Telehealth: Payer: Self-pay | Admitting: Diagnostic Neuroimaging

## 2020-04-26 NOTE — Telephone Encounter (Signed)
[facility] [employer] auth; [place] Ref # Harmon Pier on 04/26/20 & [employer] Armandina Gemma rule Josem Kaufmann: [facility] Ref # Kim on 04/26/20 order sent to GI. They will reach out to the patient to schedule.

## 2020-05-02 ENCOUNTER — Telehealth: Payer: Self-pay | Admitting: *Deleted

## 2020-05-02 ENCOUNTER — Encounter: Payer: Self-pay | Admitting: *Deleted

## 2020-05-02 NOTE — Telephone Encounter (Addendum)
Nurtec PA, key: [id number], [id number], failed Tylenol, ibuprofen, Toradol.  Message: [employer] does not review prior authorization for this plan. Please refer to the phone number on the back of the prescription ID card. Called [employer] pharmacy help desk, spoke with Joe who stated patient has active account but it is a cash or discount card. She needs to take [place] card to pharmacy to get medicine at a discount.  Sent her my chart to advise of this.  Faxed [facility] with note re: patient to bring [facility] card for discount.

## 2020-05-04 ENCOUNTER — Other Ambulatory Visit: Payer: Self-pay

## 2020-05-04 ENCOUNTER — Encounter (HOSPITAL_COMMUNITY): Payer: Self-pay | Admitting: *Deleted

## 2020-05-04 ENCOUNTER — Emergency Department (HOSPITAL_COMMUNITY)
Admission: EM | Admit: 2020-05-04 | Discharge: 2020-05-05 | Disposition: A | Discharge status: Home / Self Care | Payer: PRIVATE HEALTH INSURANCE | Attending: Emergency Medicine | Admitting: Emergency Medicine

## 2020-05-04 DIAGNOSIS — F17210 Nicotine dependence, cigarettes, uncomplicated: Secondary | ICD-10-CM | POA: Insufficient documentation

## 2020-05-04 DIAGNOSIS — R0789 Other chest pain: Secondary | ICD-10-CM

## 2020-05-04 DIAGNOSIS — Z79899 Other long term (current) drug therapy: Secondary | ICD-10-CM | POA: Insufficient documentation

## 2020-05-04 DIAGNOSIS — R079 Chest pain, unspecified: Secondary | ICD-10-CM | POA: Insufficient documentation

## 2020-05-04 DIAGNOSIS — R519 Headache, unspecified: Secondary | ICD-10-CM | POA: Diagnosis not present

## 2020-05-04 DIAGNOSIS — R42 Dizziness and giddiness: Secondary | ICD-10-CM | POA: Diagnosis not present

## 2020-05-04 LAB — CBC WITH DIFFERENTIAL/PLATELET
Abs Immature Granulocytes: 0.05 10*3/uL (ref 0.00–0.07)
Basophils Absolute: 0 10*3/uL (ref 0.0–0.1)
Basophils Relative: 0 %
Eosinophils Absolute: 0.1 10*3/uL (ref 0.0–0.5)
Eosinophils Relative: 1 %
HCT: 39.5 % (ref 36.0–46.0)
Hemoglobin: 12.3 g/dL (ref 12.0–15.0)
Immature Granulocytes: 1 %
Lymphocytes Relative: 17 %
Lymphs Abs: 1.7 10*3/uL (ref 0.7–4.0)
MCH: 27.8 pg (ref 26.0–34.0)
MCHC: 31.1 g/dL (ref 30.0–36.0)
MCV: 89.4 fL (ref 80.0–100.0)
Monocytes Absolute: 0.4 10*3/uL (ref 0.1–1.0)
Monocytes Relative: 4 %
Neutro Abs: 8.1 10*3/uL — ABNORMAL HIGH (ref 1.7–7.7)
Neutrophils Relative %: 77 %
Platelets: 270 10*3/uL (ref 150–400)
RBC: 4.42 MIL/uL (ref 3.87–5.11)
RDW: 16.2 % — ABNORMAL HIGH (ref 11.5–15.5)
WBC: 10.5 10*3/uL (ref 4.0–10.5)
nRBC: 0 % (ref 0.0–0.2)

## 2020-05-04 LAB — BASIC METABOLIC PANEL
Anion gap: 11 (ref 5–15)
BUN: 10 mg/dL (ref 6–20)
CO2: 24 mmol/L (ref 22–32)
Calcium: 9 mg/dL (ref 8.9–10.3)
Chloride: 104 mmol/L (ref 98–111)
Creatinine, Ser: 0.84 mg/dL (ref 0.44–1.00)
GFR calc Af Amer: >60 mL/min (ref >60)
GFR calc non Af Amer: >60 mL/min (ref >60)
Glucose, Bld: 104 mg/dL — ABNORMAL HIGH (ref 70–99)
Potassium: 3.8 mmol/L (ref 3.5–5.1)
Sodium: 139 mmol/L (ref 135–145)

## 2020-05-04 LAB — TROPONIN I (HIGH SENSITIVITY): Troponin I (High Sensitivity): <2 ng/L (ref <18)

## 2020-05-04 MED ORDER — DIPHENHYDRAMINE HCL 50 MG/ML IJ SOLN
25.0000 mg | Freq: Once | INTRAMUSCULAR | Status: AC
  Administered 2020-05-05: 25 mg via INTRAVENOUS
  Filled 2020-05-04: qty 1

## 2020-05-04 MED ORDER — DEXAMETHASONE SODIUM PHOSPHATE 10 MG/ML IJ SOLN
10.0000 mg | Freq: Once | INTRAMUSCULAR | Status: AC
  Administered 2020-05-05: 10 mg via INTRAVENOUS
  Filled 2020-05-04: qty 1

## 2020-05-04 MED ORDER — KETOROLAC TROMETHAMINE 30 MG/ML IJ SOLN
30.0000 mg | Freq: Once | INTRAMUSCULAR | Status: AC
  Administered 2020-05-05: 30 mg via INTRAVENOUS
  Filled 2020-05-04: qty 1

## 2020-05-04 MED ORDER — SODIUM CHLORIDE 0.9 % IV BOLUS
1000.0000 mL | Freq: Once | INTRAVENOUS | Status: AC
  Administered 2020-05-05: 1000 mL via INTRAVENOUS

## 2020-05-04 MED ORDER — METOCLOPRAMIDE HCL 5 MG/ML IJ SOLN
10.0000 mg | Freq: Once | INTRAMUSCULAR | Status: AC
  Administered 2020-05-05: 10 mg via INTRAVENOUS
  Filled 2020-05-04: qty 2

## 2020-05-04 NOTE — ED Notes (Signed)
Pt states she hit her head on corner of cabinet door two months ago.  Pt states she has been having HA's off and on since.

## 2020-05-04 NOTE — ED Triage Notes (Signed)
Pt with right sided HA and right sided facial pain since 1600.  C/o right arm pain and left sided CP at first.

## 2020-05-05 ENCOUNTER — Emergency Department (HOSPITAL_COMMUNITY): Payer: PRIVATE HEALTH INSURANCE

## 2020-05-05 DIAGNOSIS — R519 Headache, unspecified: Secondary | ICD-10-CM | POA: Diagnosis not present

## 2020-05-05 LAB — I-STAT BETA HCG BLOOD, ED (MC, WL, AP ONLY): I-stat hCG, quantitative: <5 m[IU]/mL (ref <5)

## 2020-05-05 LAB — TROPONIN I (HIGH SENSITIVITY)
Troponin I (High Sensitivity): <2 ng/L (ref <18)
Troponin I (High Sensitivity): 2 ng/L (ref <18)

## 2020-05-05 LAB — SEDIMENTATION RATE: Sed Rate: 10 mm/hr (ref 0–22)

## 2020-05-05 MED ORDER — IOHEXOL 350 MG/ML SOLN
100.0000 mL | Freq: Once | INTRAVENOUS | Status: AC | PRN
  Administered 2020-05-05: 100 mL via INTRAVENOUS

## 2020-05-05 NOTE — Discharge Instructions (Addendum)
Your MRI is normal, no aneurysm. Follow up with your doctor.  Let them know you had MRI in the ER so they do not repeat the test and give you an extra bill. Return for new concerns. Tylenol for headache.

## 2020-05-05 NOTE — ED Provider Notes (Signed)
[facility] EMERGENCY DEPARTMENT Provider Note   CSN: [id number] Arrival date & time: 05/04/20  1648     History Chief Complaint  Patient presents with  . Headache    Yesenia Gates is a 39 y.o. female.  Patient with a history of anxiety, chronic headache here with right-sided headache onset this afternoon.  States she is having headaches on a regular basis since approximately July 17.  Believes she hit her head on a cabinet approximately 1 month prior to this but denies any other injury.  She describes headache on the right side of her head neck gradual onset and progressively worsens.  Today it radiated to her right face which is unusual.  There is been no associated nausea or vomiting.  No photophobia or phonophobia.  No fever.  No focal weakness, numbness or tingling.  No visual changes.  No black spots in the vision, no double vision or blurry vision.  She saw her neurologist last week and was told this is possibly a migraine headache but she does not believe this is the case due to not having any nausea or photophobia.  She is scheduled to have an MRI next week.  She reports 3 years ago she was told she had papilledema but none was seen on her recent exam.  She had a lumbar puncture with opening pressure of 18 3 years ago but no diagnosis of idiopathic intracranial hypertension. On the way here she developed left-sided chest pain that lasted for approximately 2 hours and has since resolved.  She denies any shortness of breath, nausea or vomiting.  No cough or fever. Today she had a dizzy spell while she was bending over to pick up a child's toy and became lightheaded with room spinning and felt like she was going to pass out.  Soon after that she developed a right-sided headache gradually while she was sitting and resting.  Denies thunderclap onset.  The history is provided by the patient.  Headache Associated symptoms: dizziness   Associated symptoms: no abdominal pain, no  congestion, no cough, no fatigue, no fever, no myalgias, no nausea, no photophobia, no vomiting and no weakness        Past Medical History:  Diagnosis Date  . Abnormal Pap smear   . Anxiety   . Arthritis   . Chronic back pain   . DDD (degenerative disc disease), lumbar   . Headache   . Herniated disc   . Ovarian cyst   . Spondylisthesis     Patient Active Problem List   Diagnosis Date Noted  . Obesity (BMI 30-39.9) 02/20/2016  . Allergic rhinitis 07/17/2015  . Current smoker 07/17/2015  . Vitamin D deficiency 11/29/2014  . Hyperlipidemia 11/29/2014    Past Surgical History:  Procedure Laterality Date  . dilation and cutterage       OB History    Gravida  2   Para  1   Term  1   Preterm      AB      Living        SAB      TAB      Ectopic      Multiple      Live Births              Family History  Problem Relation Age of Onset  . Diabetes Mother   . Cancer Mother        cervical  . Rheum arthritis Mother   .  Diabetes Father   . Cancer Other   . Diabetes Sister   . Diabetes Brother   . Cancer Maternal Uncle        throat  . Stroke Maternal Uncle   . Cancer Maternal Grandmother        GYN  . Heart attack Maternal Grandfather   . Cancer Cousin        cervical  . Breast cancer Cousin   . Cancer Maternal Uncle        throat  . Cancer Cousin        breast  . Cancer Cousin        cervical    Social History   Tobacco Use  . Smoking status: Current Every Day Smoker    Packs/day: 1.00    Types: Cigarettes  . Smokeless tobacco: Never Used  Vaping Use  . Vaping Use: Never used  Substance Use Topics  . Alcohol use: Yes    Comment: occasionally  . Drug use: No    Home Medications Prior to Admission medications   Medication Sig Start Date End Date Taking? Authorizing Provider  escitalopram (LEXAPRO) 10 MG tablet Take 1 tablet (10 mg total) by mouth daily. 04/04/16   Hassell Done, Mary-Margaret, FNP  LORazepam (ATIVAN) 0.5 MG tablet  Take 0.5 mg by mouth daily as needed for anxiety.     [provider]  Multiple Vitamin (MULTIVITAMIN) tablet Take 1 tablet by mouth daily.    [provider]  Rimegepant Sulfate (NURTEC) 75 MG TBDP Take 75 mg by mouth daily as needed. 04/25/20   Penumalli, Earlean Polka, MD  topiramate (TOPAMAX) 50 MG tablet Take 1 tablet (50 mg total) by mouth 2 (two) times daily. 04/25/20   Penumalli, Earlean Polka, MD    Allergies    Penicillins  Review of Systems   Review of Systems  Constitutional: Negative for activity change, appetite change, diaphoresis, fatigue and fever.  HENT: Negative for congestion and rhinorrhea.   Eyes: Negative for photophobia and visual disturbance.  Respiratory: Positive for chest tightness. Negative for cough and shortness of breath.   Cardiovascular: Positive for chest pain. Negative for palpitations and leg swelling.  Gastrointestinal: Negative for abdominal pain, nausea and vomiting.  Genitourinary: Negative for dysuria and hematuria.  Musculoskeletal: Negative for arthralgias and myalgias.  Skin: Negative for rash.  Neurological: Positive for dizziness, light-headedness and headaches. Negative for weakness.   all other systems are negative except as noted in the HPI and PMH.    Physical Exam Updated Vital Signs BP 125/68   Pulse 96   Temp 98.5 F (36.9 C) (Oral)   Resp 20   Ht $R'5\' 6"'pM$  (1.676 m)   Wt 89.8 kg   LMP 05/02/2020   SpO2 98%   BMI 31.96 kg/m   Physical Exam Vitals and nursing note reviewed.  Constitutional:      General: She is not in acute distress.    Appearance: She is well-developed.  HENT:     Head: Normocephalic and atraumatic.     Comments: No temporal artery tenderness    Mouth/Throat:     Pharynx: No oropharyngeal exudate.  Eyes:     Extraocular Movements: Extraocular movements intact.     Conjunctiva/sclera: Conjunctivae normal.     Pupils: Pupils are equal, round, and reactive to light.     Comments: Visual fields  full to confrontation bilateral  Neck:     Comments: No meningismus. Cardiovascular:     Rate and Rhythm: Normal  rate and regular rhythm.     Heart sounds: Normal heart sounds. No murmur heard.   Pulmonary:     Effort: Pulmonary effort is normal. No respiratory distress.     Breath sounds: Normal breath sounds.  Abdominal:     Palpations: Abdomen is soft.     Tenderness: There is no abdominal tenderness. There is no guarding or rebound.  Musculoskeletal:        General: No tenderness. Normal range of motion.     Cervical back: Normal range of motion and neck supple.  Skin:    General: Skin is warm.  Neurological:     Mental Status: She is alert and oriented to person, place, and time.     Cranial Nerves: No cranial nerve deficit.     Motor: No abnormal muscle tone.     Coordination: Coordination normal.     Comments: CN 2-12 intact, no ataxia on finger to nose, no nystagmus, 5/5 strength throughout, no pronator drift, Romberg negative, normal gait.   Psychiatric:        Behavior: Behavior normal.     ED Results / Procedures / Treatments   Labs (all labs ordered are listed, but only abnormal results are displayed) Labs Reviewed  CBC WITH DIFFERENTIAL/PLATELET - Abnormal; Notable for the following components:      Result Value   RDW 16.2 (*)    Neutro Abs 8.1 (*)    All other components within normal limits  BASIC METABOLIC PANEL - Abnormal; Notable for the following components:   Glucose, Bld 104 (*)    All other components within normal limits  SEDIMENTATION RATE  I-STAT BETA HCG BLOOD, ED ([facility], [facility], [facility] ONLY)  TROPONIN I (HIGH SENSITIVITY)  TROPONIN I (HIGH SENSITIVITY)  TROPONIN I (HIGH SENSITIVITY)    EKG EKG Interpretation  Date/Time:  Thursday May 04 2020 17:02:23 EDT Ventricular Rate:  107 PR Interval:  154 QRS Duration: 84 QT Interval:  342 QTC Calculation: 456 R Axis:   94 Text Interpretation: Sinus tachycardia Rightward axis Borderline ECG Confirmed  by Noemi Chapel [phone]) on 05/04/2020 9:12:48 PM   Radiology CT Angio Head W or Wo Contrast  Result Date: 05/05/2020 CLINICAL DATA:  Initial evaluation for intermittent headaches for 2 months. EXAM: CT ANGIOGRAPHY HEAD AND NECK TECHNIQUE: Multidetector CT imaging of the head and neck was performed using the standard protocol during bolus administration of intravenous contrast. Multiplanar CT image reconstructions and MIPs were obtained to evaluate the vascular anatomy. Carotid stenosis measurements (when applicable) are obtained utilizing NASCET criteria, using the distal internal carotid diameter as the denominator. CONTRAST:  178mL OMNIPAQUE IOHEXOL 350 MG/ML SOLN COMPARISON:  Prior study from 04/25/2020. FINDINGS: CT HEAD FINDINGS Brain: Cerebral volume within normal limits for patient age. No evidence for acute intracranial hemorrhage. No findings to suggest acute large vessel territory infarct. No mass lesion, midline shift, or mass effect. Ventricles are normal in size without evidence for hydrocephalus. No extra-axial fluid collection identified. Vascular: No hyperdense vessel identified. Skull: Scalp soft tissues demonstrate no acute abnormality. Calvarium intact. Sinuses/Orbits: Globes and orbital soft tissues within normal limits. Visualized paranasal sinuses are clear. No mastoid effusion. CTA NECK FINDINGS Aortic arch: Visualized aortic arch of normal caliber with normal branch pattern. No flow-limiting stenosis or other acute abnormality about the origin of the great vessels. Right carotid system: Right common and internal carotid arteries widely patent without stenosis, dissection or occlusion. Left carotid system: Left common carotid artery widely patent from its origin to  the bifurcation. Minor eccentric plaque at the left bifurcation without significant stenosis. Left ICA widely patent distally without stenosis, dissection or occlusion. Vertebral arteries: Both vertebral arteries arise from  the subclavian arteries. Vertebral arteries widely patent without stenosis, dissection or occlusion. Skeleton: No acute osseous abnormality. No discrete or worrisome osseous lesions. Other neck: No other acute soft tissue abnormality within the neck. No mass lesion or adenopathy. Upper chest: Visualized upper chest demonstrates no acute finding. Review of the MIP images confirms the above findings CTA HEAD FINDINGS Anterior circulation: Both internal carotid arteries widely patent to the termini without stenosis or other abnormality. A1 segments patent bilaterally. Normal anterior communicating artery complex. Anterior cerebral arteries widely patent to their distal aspects. No M1 stenosis or occlusion. Normal left MCA bifurcation. On the right, there is a 2 mm blush of contrast seen adjacent to the right MCA bifurcation (series 11, image 99). Finding is favored to be related to the underlying venous vasculature which partially obscures this region. A small aneurysm is difficult to exclude. Distal MCA branches well perfused and symmetric. Posterior circulation: Both vertebral arteries widely patent to the vertebrobasilar junction without stenosis. Left vertebral artery slightly dominant. Patent left PICA. Right PICA not definitely seen. Basilar widely patent to its distal aspect. Superior cerebral arteries patent bilaterally. Both PCAs primarily supplied via the basilar well perfused to their distal aspects. Venous sinuses: Patent. Anatomic variants: None significant. Review of the MIP images confirms the above findings IMPRESSION: CT HEAD IMPRESSION: Normal head CT.  No acute intracranial abnormality identified. CTA HEAD AND NECK IMPRESSION: 1. 2 mm rounded blush of contrast at the level of the right MCA bifurcation, indeterminate, but favored to be secondary to adjacent venous contamination. A small aneurysm is difficult to exclude. Further assessment with dedicated MRA suggested for further characterization. 2.  Otherwise normal CTA of the head and neck. No large vessel occlusion, hemodynamically significant stenosis, or other acute vascular abnormality. Electronically Signed   By: Jeannine Boga M.D.   On: 05/05/2020 01:30   CT Angio Neck W and/or Wo Contrast  Result Date: 05/05/2020 CLINICAL DATA:  Initial evaluation for intermittent headaches for 2 months. EXAM: CT ANGIOGRAPHY HEAD AND NECK TECHNIQUE: Multidetector CT imaging of the head and neck was performed using the standard protocol during bolus administration of intravenous contrast. Multiplanar CT image reconstructions and MIPs were obtained to evaluate the vascular anatomy. Carotid stenosis measurements (when applicable) are obtained utilizing NASCET criteria, using the distal internal carotid diameter as the denominator. CONTRAST:  194mL OMNIPAQUE IOHEXOL 350 MG/ML SOLN COMPARISON:  Prior study from 04/25/2020. FINDINGS: CT HEAD FINDINGS Brain: Cerebral volume within normal limits for patient age. No evidence for acute intracranial hemorrhage. No findings to suggest acute large vessel territory infarct. No mass lesion, midline shift, or mass effect. Ventricles are normal in size without evidence for hydrocephalus. No extra-axial fluid collection identified. Vascular: No hyperdense vessel identified. Skull: Scalp soft tissues demonstrate no acute abnormality. Calvarium intact. Sinuses/Orbits: Globes and orbital soft tissues within normal limits. Visualized paranasal sinuses are clear. No mastoid effusion. CTA NECK FINDINGS Aortic arch: Visualized aortic arch of normal caliber with normal branch pattern. No flow-limiting stenosis or other acute abnormality about the origin of the great vessels. Right carotid system: Right common and internal carotid arteries widely patent without stenosis, dissection or occlusion. Left carotid system: Left common carotid artery widely patent from its origin to the bifurcation. Minor eccentric plaque at the left  bifurcation without significant stenosis. Left ICA widely  patent distally without stenosis, dissection or occlusion. Vertebral arteries: Both vertebral arteries arise from the subclavian arteries. Vertebral arteries widely patent without stenosis, dissection or occlusion. Skeleton: No acute osseous abnormality. No discrete or worrisome osseous lesions. Other neck: No other acute soft tissue abnormality within the neck. No mass lesion or adenopathy. Upper chest: Visualized upper chest demonstrates no acute finding. Review of the MIP images confirms the above findings CTA HEAD FINDINGS Anterior circulation: Both internal carotid arteries widely patent to the termini without stenosis or other abnormality. A1 segments patent bilaterally. Normal anterior communicating artery complex. Anterior cerebral arteries widely patent to their distal aspects. No M1 stenosis or occlusion. Normal left MCA bifurcation. On the right, there is a 2 mm blush of contrast seen adjacent to the right MCA bifurcation (series 11, image 99). Finding is favored to be related to the underlying venous vasculature which partially obscures this region. A small aneurysm is difficult to exclude. Distal MCA branches well perfused and symmetric. Posterior circulation: Both vertebral arteries widely patent to the vertebrobasilar junction without stenosis. Left vertebral artery slightly dominant. Patent left PICA. Right PICA not definitely seen. Basilar widely patent to its distal aspect. Superior cerebral arteries patent bilaterally. Both PCAs primarily supplied via the basilar well perfused to their distal aspects. Venous sinuses: Patent. Anatomic variants: None significant. Review of the MIP images confirms the above findings IMPRESSION: CT HEAD IMPRESSION: Normal head CT.  No acute intracranial abnormality identified. CTA HEAD AND NECK IMPRESSION: 1. 2 mm rounded blush of contrast at the level of the right MCA bifurcation, indeterminate, but favored to  be secondary to adjacent venous contamination. A small aneurysm is difficult to exclude. Further assessment with dedicated MRA suggested for further characterization. 2. Otherwise normal CTA of the head and neck. No large vessel occlusion, hemodynamically significant stenosis, or other acute vascular abnormality. Electronically Signed   By: Jeannine Boga M.D.   On: 05/05/2020 01:30    Procedures Procedures (including critical care time)  Medications Ordered in ED Medications  metoCLOPramide (REGLAN) injection 10 mg (has no administration in time range)  dexamethasone (DECADRON) injection 10 mg (10 mg Intravenous Given 05/05/20 0007)  diphenhydrAMINE (BENADRYL) injection 25 mg (25 mg Intravenous Given 05/05/20 0008)  ketorolac (TORADOL) 30 MG/ML injection 30 mg (30 mg Intravenous Given 05/05/20 0007)  sodium chloride 0.9 % bolus 1,000 mL (1,000 mLs Intravenous New Bag/Given (Non-Interop) 05/05/20 0007)    ED Course  I have reviewed the triage vital signs and the nursing notes.  Pertinent labs & imaging results that were available during my care of the patient were reviewed by me and considered in my medical decision making (see chart for details).    MDM Rules/Calculators/A&P                         Gradual onset right-sided headache similar to previous.  Recent CT scan reviewed and negative.  Denies thunderclap onset.  No focal neurological deficits.  Low suspicion for subarachnoid hemorrhage, meningitis, temporal arteritis.  No visual changes.  She states the pain in the right side of her head that comes on when it is pressed on prolonged period of time but is not tender to palpation.  Also had episode of chest pain earlier but has since resolved.  EKG is sinus rhythm.  Troponins are negative.  Low suspicion for ACS or pulmonary embolism.  Headache is improved with Reglan, Benadryl, Toradol and IV fluids.  Low suspicion for subarachnoid hemorrhage,  meningitis, temporal arteritis ESR  is normal.  CTA as above with indeterminate focus at MCA bifurcation. Radiology recommends MRA to exclude aneurysm.  Patient's headache is improved.  She is anxious. No visual changes or appreciable papilledema to suggest intracranial hypertension at this time.  Patient agreeable to wait for MRA to exclude aneurysm.  She was nervous about this potential finding.  Try to reassure her that this is questionable and more information is needed.  MRA is pending at time of shift change.  If findings are positive for aneurysm will likely need neurology consultation. But her recurrent headache for the past 6 weeks does not sound like a subarachnoid hemorrhage. Feel patient can be discharged for neurology follow-up his MRI excludes aneurysm. If abnormal findings on MRA, may need neurology consultation.  Patient agrees with plan. Headache has resolved. No further chest pain.  Dr. Reather Converse to assume care.   Final Clinical Impression(s) / ED Diagnoses Final diagnoses:  Bad headache  Atypical chest pain    Rx / DC Orders ED Discharge Orders    None       Xariah Silvernail, Annie Main, MD 05/05/20 [phone]

## 2020-05-08 ENCOUNTER — Telehealth: Payer: Self-pay | Admitting: *Deleted

## 2020-05-08 NOTE — Telephone Encounter (Signed)
Reviewed scans. MRA looks good (no aneurysm). May setup video / phone visit to discuss further . -VRP

## 2020-05-08 NOTE — Telephone Encounter (Signed)
Spoke with patient and advised her of Dr Nikki Dom note. We scheduled my chart VV. She asked if she needs LP.  I advised she have eye dr send over notes; MD will need those before deciding on LP. Patient verbalized understanding, appreciation. Yesenia Gates

## 2020-05-08 NOTE — Telephone Encounter (Signed)
Patient called to FU on her previous VM. She went to ED last Thurs, had CT head scan and was told she had possible small aneurysm, MRI brain was reported as okay. She was told to FU with neurology in 2 days. She has been anxious due to family history of aneurysms, ongoing headache and thinking something needed immediate attention. I assured her that ED would have kept her, had neurology see her and/or do further testing if anything needed immediate attention.  She has appointment with ophthalmology tomorrow. I advised she keep that appointment, and her message was sent to MD. I'll call her with his reply. I gave her fax # and advised she have eye dr fax over office notes. Patient verbalized understanding, appreciation.

## 2020-05-08 NOTE — Telephone Encounter (Signed)
Patient left a voicemail on my phone stating she went to the ER on Friday and she wants to know what those results are from  the MRI's  she states the Hospital told her she needs to follow up with her neurologist in 2 days.

## 2020-05-08 NOTE — Telephone Encounter (Signed)
Received through my chart: Reason for visit: Office Visit    Comments:  To go over MRI results from 05/05/2020. I had done at [facility]. They wanted me to follow up with you in 2 days.   Patient scheduled for same tests at [facility]. Will route to MD for result notes, reply to patient to cancel tests at [facility].

## 2020-05-09 ENCOUNTER — Other Ambulatory Visit: Payer: PRIVATE HEALTH INSURANCE

## 2020-05-17 ENCOUNTER — Telehealth (INDEPENDENT_AMBULATORY_CARE_PROVIDER_SITE_OTHER): Payer: PRIVATE HEALTH INSURANCE | Admitting: Diagnostic Neuroimaging

## 2020-05-17 ENCOUNTER — Encounter: Payer: Self-pay | Admitting: *Deleted

## 2020-05-17 ENCOUNTER — Encounter: Payer: Self-pay | Admitting: Diagnostic Neuroimaging

## 2020-05-17 DIAGNOSIS — R519 Headache, unspecified: Secondary | ICD-10-CM | POA: Diagnosis not present

## 2020-05-17 DIAGNOSIS — G43009 Migraine without aura, not intractable, without status migrainosus: Secondary | ICD-10-CM

## 2020-05-17 MED ORDER — NURTEC 75 MG PO TBDP: 75.0000 mg | ORAL_TABLET | Freq: Every day | ORAL | 12 refills | Status: AC | PRN

## 2020-05-17 MED ORDER — AJOVY 225 MG/1.5ML ~~LOC~~ SOAJ: 225.0000 mg | SUBCUTANEOUS | 4 refills | Status: AC

## 2020-05-17 MED ORDER — RIZATRIPTAN BENZOATE 10 MG PO TBDP: 10.0000 mg | ORAL_TABLET | ORAL | 11 refills | Status: AC | PRN

## 2020-05-17 MED ORDER — TOPIRAMATE 50 MG PO TABS: 50.0000 mg | ORAL_TABLET | Freq: Two times a day (BID) | ORAL | 12 refills | Status: AC

## 2020-05-17 NOTE — Patient Instructions (Signed)
  MIGRAINE PREVENTION  - continue topiramate 50mg  twice a day; drink plenty of water - start fremanezumab (Ajovy) 225mg  monthly   MIGRAINE RESCUE  - ibuprofen, tylenol as needed - start rizatriptan (Maxalt) 10mg  as needed for breakthrough headache; may repeat x 1 after 2 hours; max 2 tabs per day or 8 per month - start rimegepant (Nurtec) 75mg  as needed for breakthrough headache; max 8 per month

## 2020-05-17 NOTE — Progress Notes (Signed)
[facility]  PATIENT: Yesenia Gates DOB: Feb 04, 1981  REFERRING CLINICIAN:  HISTORY FROM: patient  REASON FOR VISIT: follow up   HISTORICAL  CHIEF COMPLAINT:  Chief Complaint  Patient presents with  . Headache    HISTORY OF PRESENT ILLNESS:   Virtual Visit via Video Note  I connected with Yesenia Gates on 05/17/20 at  3:00 PM EDT by a video enabled telemedicine application and verified that I am speaking with the correct person using two identifiers.  Location: Patient: home Provider: office   I discussed the limitations of evaluation and management by telemedicine and the availability of in person appointments. The patient expressed understanding and agreed to proceed.   I discussed the assessment and treatment plan with the patient. The patient was provided an opportunity to ask questions and all were answered. The patient agreed with the plan and demonstrated an understanding of the instructions.   The patient was advised to call back or seek an in-person evaluation if the symptoms worsen or if the condition fails to improve as anticipated.  I provided 25 minutes of non-face-to-face time during this encounter.   Penni Bombard, MD    UPDATE (05/17/20, VRP): Since last visit, doing about the same. Symptoms are stable. Severity is moderate. No alleviating or aggravating factors. Tolerating meds. HA are   UPDATE (04/25/20, VRP): Since last visit, doing well until last 6 months (increasing anxiety, dizziness, headaches). On 03/25/20, became upset, then had rigth sided throbbing HA, nausea, scalp crawling sensations. Dizziness, weakness, anxiety have worsened. Symptoms are constant. Severity is moderate. No alleviating or aggravating factors. Tolerating meds.    UPDATE 03/07/17: Since last visit, HAs continued. 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 Now referred here for  evaluation. No tinnitus. Some right TMJ pressure. Daily HA (lasting minutes at a time). No transient visual obscurations. No snoring. Has insomnia.   PRIOR HPI (05/27/16): 39 year old female here for evaluation of headaches. Patient describes intermittent sharp pins and needles sensation in her head for the past one year. Symptoms can occur on a daily basis lasting for seconds at a time. They may occur throughout the day. This first started when she rose up from bed and felt the sensation on the top and right side of her head. Rarely this happened on the left side. No nausea or vomiting. Sometimes she has phonophobia with these episodes. Triggering factors include alcohol use. Patient averaging about 6 hours of sleep at night. Patient never had this problem before. Patient also has depression, anxiety, racing thoughts, sleepiness, and has been on Lexapro for the past 4 months. Patient has also had 50 pound weight gain over the past 9 months.   REVIEW OF SYSTEMS: Full 14 system review of systems performed and negative with exception of: headaches depression anxiety.    ALLERGIES: Allergies  Allergen Reactions  . Penicillins Rash    HOME MEDICATIONS: Outpatient Medications Prior to Visit  Medication Sig Dispense Refill  . escitalopram (LEXAPRO) 10 MG tablet Take 1 tablet (10 mg total) by mouth daily. 90 tablet 3  . LORazepam (ATIVAN) 0.5 MG tablet Take 0.5 mg by mouth daily as needed for anxiety.     . Multiple Vitamin (MULTIVITAMIN) tablet Take 1 tablet by mouth daily.    . Rimegepant Sulfate (NURTEC) 75 MG TBDP Take 75 mg by mouth daily as needed. 8 tablet 12  . topiramate (TOPAMAX) 50  MG tablet Take 1 tablet (50 mg total) by mouth 2 (two) times daily. 60 tablet 12   No facility-administered medications prior to visit.    PAST MEDICAL HISTORY: Past Medical History:  Diagnosis Date  . Abnormal Pap smear   . Anxiety   . Arthritis   . Chronic back pain   . DDD (degenerative disc  disease), lumbar   . Headache   . Herniated disc   . Ovarian cyst   . Spondylisthesis     PAST SURGICAL HISTORY: Past Surgical History:  Procedure Laterality Date  . dilation and cutterage      FAMILY HISTORY: Family History  Problem Relation Age of Onset  . Diabetes Mother   . Cancer Mother        cervical  . Rheum arthritis Mother   . Diabetes Father   . Cancer Other   . Diabetes Sister   . Diabetes Brother   . Cancer Maternal Uncle        throat  . Stroke Maternal Uncle   . Cancer Maternal Grandmother        GYN  . Heart attack Maternal Grandfather   . Cancer Cousin        cervical  . Breast cancer Cousin   . Cancer Maternal Uncle        throat  . Cancer Cousin        breast  . Cancer Cousin        cervical    SOCIAL HISTORY:  Social History   Socioeconomic History  . Marital status: Single    Spouse name: Not on file  . Number of children: 1  . Years of education: 36  . Highest education level: Not on file  Occupational History    Comment: [name]   Tobacco Use  . Smoking status: Current Every Day Smoker    Packs/day: 1.00    Types: Cigarettes  . Smokeless tobacco: Never Used  Vaping Use  . Vaping Use: Never used  Substance and Sexual Activity  . Alcohol use: Yes    Comment: occasionally  . Drug use: No  . Sexual activity: Not Currently    Birth control/protection: Injection  Other Topics Concern  . Not on file  Social History Narrative   Lives at home with son   Caffeine use- use rarely   Social Determinants of Health   Financial Resource Strain:   . Difficulty of Paying Living Expenses: Not on file  Food Insecurity:   . Worried About Charity fundraiser in the Last Year: Not on file  . Ran Out of Food in the Last Year: Not on file  Transportation Needs:   . Lack of Transportation (Medical): Not on file  . Lack of Transportation (Non-Medical): Not on file  Physical Activity:   . Days of Exercise per Week: Not on file  . Minutes  of Exercise per Session: Not on file  Stress:   . Feeling of Stress : Not on file  Social Connections:   . Frequency of Communication with Friends and Family: Not on file  . Frequency of Social Gatherings with Friends and Family: Not on file  . Attends Religious Services: Not on file  . Active Member of Clubs or Organizations: Not on file  . Attends Archivist Meetings: Not on file  . Marital Status: Not on file  Intimate Partner Violence:   . Fear of Current or Ex-Partner: Not on file  . Emotionally Abused: Not  on file  . Physically Abused: Not on file  . Sexually Abused: Not on file     PHYSICAL EXAM  Video visit      DIAGNOSTIC DATA (LABS, IMAGING, TESTING) - I reviewed patient records, labs, notes, testing and imaging myself where available.  Lab Results  Component Value Date   WBC 10.5 05/04/2020   HGB 12.3 05/04/2020   HCT 39.5 05/04/2020   MCV 89.4 05/04/2020   PLT 270 05/04/2020      Component Value Date/Time   NA 139 05/04/2020 1819   NA 142 12/08/2015 1449   K 3.8 05/04/2020 1819   CL 104 05/04/2020 1819   CO2 24 05/04/2020 1819   GLUCOSE 104 (H) 05/04/2020 1819   BUN 10 05/04/2020 1819   BUN 8 12/08/2015 1449   CREATININE 0.84 05/04/2020 1819   CALCIUM 9.0 05/04/2020 1819   PROT 6.6 12/08/2015 1449   ALBUMIN 4.5 12/08/2015 1449   AST 9 12/08/2015 1449   ALT 14 12/08/2015 1449   ALKPHOS 97 12/08/2015 1449   BILITOT 0.3 12/08/2015 1449   GFRNONAA >60 05/04/2020 1819   GFRAA >60 05/04/2020 1819   Lab Results  Component Value Date   CHOL 105 12/08/2015   HDL 35 (L) 12/08/2015   LDLCALC 51 12/08/2015   TRIG 94 12/08/2015   CHOLHDL 3.0 12/08/2015   No results found for: HGBA1C Lab Results  Component Value Date   VITAMINB12 362 12/08/2015   Lab Results  Component Value Date   TSH 1.370 05/20/2014    July 2017 MRI brain [place], [place]; [place]): normal per patient report  03/23/17 MRI brain  - normal  04/16/17 LP -  Successful fluoroscopic guided lumbar puncture. Opening pressure of 18 cm water.  04/25/20 CT head [I reviewed images myself and agree with interpretation. -VRP]  - Negative noncontrast head CT.  05/05/20 MRI brain 1. Stable since 2018 and normal noncontrast MRI appearance of the brain. 2. Intracranial MRA reported separately.  05/05/20 MRA head  - Negative intracranial MRA. The right MCA trifurcation is within normal limits. No intracranial aneurysm identified.  05/05/20 CTA head / neck 1. 2 mm rounded blush of contrast at the level of the right MCA bifurcation, indeterminate, but favored to be secondary to adjacent venous contamination. A small aneurysm is difficult to exclude. Further assessment with dedicated MRA suggested for further characterization. 2. Otherwise normal CTA of the head and neck. No large vessel occlusion, hemodynamically significant stenosis, or other acute vascular abnormality.      ASSESSMENT AND PLAN  39 y.o. year old female here with new onset pain and headache since 2016. Had normal MRI in 2017 and 2021. No papilledema on recent eye exam (Dr. Valetta Close 05/09/20). Symptoms mainly related to migraine.    Dx:   1. Migraine without aura and without status migrainosus, not intractable   2. New onset headache      PLAN:  MIGRAINE PREVENTION  LIFESTYLE CHANGES -Stop or avoid smoking -Decrease or avoid caffeine / alcohol -Eat and sleep on a regular schedule -Exercise several times per week - continue topiramate 50mg  twice a day; drink plenty of water - start fremanezumab (Ajovy) 225mg  monthly   MIGRAINE RESCUE  - ibuprofen, tylenol as needed - start rizatriptan (Maxalt) 10mg  as needed for breakthrough headache; may repeat x 1 after 2 hours; max 2 tabs per day or 8 per month - start rimegepant (Nurtec) 75mg  as needed for breakthrough headache; max 8 per month  Meds ordered this  encounter  Medications  . Fremanezumab-vfrm (AJOVY) 225 MG/1.5ML SOAJ      Sig: Inject 225 mg into the skin every 30 (thirty) days.    Dispense:  4.5 mL    Refill:  4  . Rimegepant Sulfate (NURTEC) 75 MG TBDP    Sig: Take 75 mg by mouth daily as needed.    Dispense:  8 tablet    Refill:  12  . topiramate (TOPAMAX) 50 MG tablet    Sig: Take 1 tablet (50 mg total) by mouth 2 (two) times daily.    Dispense:  60 tablet    Refill:  12  . rizatriptan (MAXALT-MLT) 10 MG disintegrating tablet    Sig: Take 1 tablet (10 mg total) by mouth as needed for migraine. May repeat in 2 hours if needed    Dispense:  9 tablet    Refill:  11   Return in about 6 months (around 11/14/2020) for with NP (Amy Lomax).    Penni Bombard, MD 12/17/8262, 1:58 PM Certified in Neurology, Neurophysiology and Neuroimaging  [facility] [address], [place], [place] [address] [phone]

## 2020-05-23 ENCOUNTER — Telehealth: Payer: Self-pay | Admitting: *Deleted

## 2020-05-23 NOTE — Telephone Encounter (Signed)
Received fax from [place], re: Nurtec PA form. Filled out, placed on MD desk for review, signature.

## 2020-05-23 NOTE — Telephone Encounter (Signed)
Nurtec PA form and last 2 office notes faxed to [facility].

## 2020-05-24 ENCOUNTER — Encounter: Payer: Self-pay | Admitting: *Deleted

## 2020-05-24 NOTE — Telephone Encounter (Addendum)
Received fax from [place] denied, must try/fail 30 day trial of 3 of following- almotriptan, eletriptan, frovatriptan, naratriptan, rizatriptan, sumatriptan, zolmitriptan. Fail  Ubrelvy x 30 day trial, drug not used in combination with another acute CGRP. Sent my chart to advise patient and advise she use Rizatriptan or try Nurtec discount card.

## 2020-06-12 ENCOUNTER — Ambulatory Visit: Payer: PRIVATE HEALTH INSURANCE | Admitting: Diagnostic Neuroimaging

## 2020-06-20 DIAGNOSIS — R000 Tachycardia, unspecified: Secondary | ICD-10-CM | POA: Diagnosis not present

## 2020-06-20 DIAGNOSIS — R457 State of emotional shock and stress, unspecified: Secondary | ICD-10-CM | POA: Diagnosis not present

## 2020-06-20 DIAGNOSIS — R079 Chest pain, unspecified: Secondary | ICD-10-CM | POA: Diagnosis not present

## 2020-06-20 DIAGNOSIS — R0789 Other chest pain: Secondary | ICD-10-CM | POA: Diagnosis not present

## 2020-07-02 DIAGNOSIS — I10 Essential (primary) hypertension: Secondary | ICD-10-CM | POA: Diagnosis not present

## 2020-07-02 DIAGNOSIS — R5381 Other malaise: Secondary | ICD-10-CM | POA: Diagnosis not present

## 2020-07-02 DIAGNOSIS — R457 State of emotional shock and stress, unspecified: Secondary | ICD-10-CM | POA: Diagnosis not present

## 2020-07-02 DIAGNOSIS — G4489 Other headache syndrome: Secondary | ICD-10-CM | POA: Diagnosis not present

## 2020-07-17 ENCOUNTER — Telehealth: Payer: Self-pay | Admitting: Diagnostic Neuroimaging

## 2020-07-17 NOTE — Telephone Encounter (Signed)
I called patient to review scan results again. No aneurysm. Continue migraine treatments. -VRP

## 2020-09-12 ENCOUNTER — Telehealth: Payer: Self-pay | Admitting: *Deleted

## 2020-09-12 ENCOUNTER — Encounter: Payer: Self-pay | Admitting: *Deleted

## 2020-09-12 NOTE — Telephone Encounter (Signed)
Patient returned call, stated she now has [employer]. She will send my chart with insurance information.

## 2020-09-12 NOTE — Telephone Encounter (Signed)
Patient called with current insurance, [place] [employer] [place], [place] [id number], [place] [id number], PCN [id number], Group [id number]. Started PA on CMM,  Key- [id number]. Patient reports 50% or more reduction in #, severity and duration of headaches.  Your information has been submitted. The member's plan is reviewing the PA request and you will receive an electronic response. You may check for the updated outcome later by reopening this request. The standard fax determination will also be sent to you directly.  If you have any questions about your PA submission, please contact your member's plan at [phone].

## 2020-09-12 NOTE — Telephone Encounter (Addendum)
Ajovy PA, unable to complete on CMM. Called [facility] spoke with Raynelle Fanning who stated insurance expired 07/30/20. She has Rx insurance with  ID [id number], and all medicines are processed at pharmacy for a discount. This RX plan does not handle medicines that require a PA; for PA she needs to call  insurance [phone] customer member services to reactivate her PA insurance coverage. Pharmacy can try to use Rx ID to run Ajovy Rx for a discount, but if it requires PA patient must reactivate other insurance. Called patient and LVM requesting call back to discuss.

## 2020-09-12 NOTE — Telephone Encounter (Addendum)
Ajovy approved Effective from 09/12/2020 through 09/12/2021. Notified patient via my chart.

## 2020-10-25 ENCOUNTER — Telehealth: Payer: Self-pay

## 2020-10-25 NOTE — Progress Notes (Deleted)
No chief complaint on file.    HISTORY OF PRESENT ILLNESS: 10/25/20 ALL:   Yesenia Gates is a 40 y.o. female here today for follow up for migraines.     HISTORY (copied from Dr Gladstone Lighter previous note)  UPDATE (05/17/20, VRP): Since last visit, doing about the same. Symptoms are stable. Severity is moderate. No alleviating or aggravating factors. Tolerating meds. HA are   UPDATE (04/25/20, VRP): Since last visit, doing well until last 6 months (increasing anxiety, dizziness, headaches). On 03/25/20, became upset, then had rigth sided throbbing HA, nausea, scalp crawling sensations. Dizziness, weakness, anxiety have worsened. Symptoms are constant. Severity is moderate. No alleviating or aggravating factors. Tolerating meds.    UPDATE 03/07/17: Since last visit, HAs continued. 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 Now referred here for evaluation. No tinnitus. Some right TMJ pressure. Daily HA (lasting minutes at a time). No transient visual obscurations. No snoring. Has insomnia.   PRIOR HPI (05/27/16): 40 year old female here for evaluation of headaches. Patient describes intermittent sharp pins and needles sensation in her head for the past one year. Symptoms can occur on a daily basis lasting for seconds at a time. They may occur throughout the day. This first started when she rose up from bed and felt the sensation on the top and right side of her head. Rarely this happened on the left side. No nausea or vomiting. Sometimes she has phonophobia with these episodes. Triggering factors include alcohol use. Patient averaging about 6 hours of sleep at night. Patient never had this problem before. Patient also has depression, anxiety, racing thoughts, sleepiness, and has been on Lexapro for the past 4 months. Patient has also had 50 pound weight gain over the past 9 months.     REVIEW OF SYSTEMS: Out of a complete 14  system review of symptoms, the patient complains only of the following symptoms, and all other reviewed systems are negative.    ALLERGIES: Allergies  Allergen Reactions  . Penicillins Rash     HOME MEDICATIONS: Outpatient Medications Prior to Visit  Medication Sig Dispense Refill  . escitalopram (LEXAPRO) 10 MG tablet Take 1 tablet (10 mg total) by mouth daily. 90 tablet 3  . Fremanezumab-vfrm (AJOVY) 225 MG/1.5ML SOAJ Inject 225 mg into the skin every 30 (thirty) days. 4.5 mL 4  . LORazepam (ATIVAN) 0.5 MG tablet Take 0.5 mg by mouth daily as needed for anxiety.     . Multiple Vitamin (MULTIVITAMIN) tablet Take 1 tablet by mouth daily.    . Rimegepant Sulfate (NURTEC) 75 MG TBDP Take 75 mg by mouth daily as needed. 8 tablet 12  . rizatriptan (MAXALT-MLT) 10 MG disintegrating tablet Take 1 tablet (10 mg total) by mouth as needed for migraine. May repeat in 2 hours if needed 9 tablet 11  . topiramate (TOPAMAX) 50 MG tablet Take 1 tablet (50 mg total) by mouth 2 (two) times daily. 60 tablet 12   No facility-administered medications prior to visit.     PAST MEDICAL HISTORY: Past Medical History:  Diagnosis Date  . Abnormal Pap smear   . Anxiety   . Arthritis   . Chronic back pain   . DDD (degenerative disc disease), lumbar   . Headache   . Herniated disc   . Ovarian cyst   . Spondylisthesis      PAST SURGICAL HISTORY: Past Surgical History:  Procedure Laterality Date  .  dilation and cutterage       FAMILY HISTORY: Family History  Problem Relation Age of Onset  . Diabetes Mother   . Cancer Mother        cervical  . Rheum arthritis Mother   . Diabetes Father   . Cancer Other   . Diabetes Sister   . Diabetes Brother   . Cancer Maternal Uncle        throat  . Stroke Maternal Uncle   . Cancer Maternal Grandmother        GYN  . Heart attack Maternal Grandfather   . Cancer Cousin        cervical  . Breast cancer Cousin   . Cancer Maternal Uncle         throat  . Cancer Cousin        breast  . Cancer Cousin        cervical     SOCIAL HISTORY: Social History   Socioeconomic History  . Marital status: Single    Spouse name: Not on file  . Number of children: 1  . Years of education: 36  . Highest education level: Not on file  Occupational History    Comment: [name]   Tobacco Use  . Smoking status: Current Every Day Smoker    Packs/day: 1.00    Types: Cigarettes  . Smokeless tobacco: Never Used  Vaping Use  . Vaping Use: Never used  Substance and Sexual Activity  . Alcohol use: Yes    Comment: occasionally  . Drug use: No  . Sexual activity: Not Currently    Birth control/protection: Injection  Other Topics Concern  . Not on file  Social History Narrative   Lives at home with son   Caffeine use- use rarely   Social Determinants of Health   Financial Resource Strain: Not on file  Food Insecurity: Not on file  Transportation Needs: Not on file  Physical Activity: Not on file  Stress: Not on file  Social Connections: Not on file  Intimate Partner Violence: Not on file      PHYSICAL EXAM  There were no vitals filed for this visit. There is no height or weight on file to calculate BMI.   Generalized: Well developed, in no acute distress  Cardiology: normal rate and rhythm, no murmur auscultated  Respiratory: clear to auscultation bilaterally    Neurological examination  Mentation: Alert oriented to time, place, history taking. Follows all commands speech and language fluent Cranial nerve II-XII: Pupils were equal round reactive to light. Extraocular movements were full, visual field were full on confrontational test. Facial sensation and strength were normal. Uvula tongue midline. Head turning and shoulder shrug  were normal and symmetric. Motor: The motor testing reveals 5 over 5 strength of all 4 extremities. Good symmetric motor tone is noted throughout.  Sensory: Sensory testing is intact to soft touch  on all 4 extremities. No evidence of extinction is noted.  Coordination: Cerebellar testing reveals good finger-nose-finger and heel-to-shin bilaterally.  Gait and station: Gait is normal. Tandem gait is normal. Romberg is negative. No drift is seen.  Reflexes: Deep tendon reflexes are symmetric and normal bilaterally.     DIAGNOSTIC DATA (LABS, IMAGING, TESTING) - I reviewed patient records, labs, notes, testing and imaging myself where available.  Lab Results  Component Value Date   WBC 10.5 05/04/2020   HGB 12.3 05/04/2020   HCT 39.5 05/04/2020   MCV 89.4 05/04/2020   PLT 270 05/04/2020  Component Value Date/Time   NA 139 05/04/2020 1819   NA 142 12/08/2015 1449   K 3.8 05/04/2020 1819   CL 104 05/04/2020 1819   CO2 24 05/04/2020 1819   GLUCOSE 104 (H) 05/04/2020 1819   BUN 10 05/04/2020 1819   BUN 8 12/08/2015 1449   CREATININE 0.84 05/04/2020 1819   CALCIUM 9.0 05/04/2020 1819   PROT 6.6 12/08/2015 1449   ALBUMIN 4.5 12/08/2015 1449   AST 9 12/08/2015 1449   ALT 14 12/08/2015 1449   ALKPHOS 97 12/08/2015 1449   BILITOT 0.3 12/08/2015 1449   GFRNONAA >60 05/04/2020 1819   GFRAA >60 05/04/2020 1819   Lab Results  Component Value Date   CHOL 105 12/08/2015   HDL 35 (L) 12/08/2015   LDLCALC 51 12/08/2015   TRIG 94 12/08/2015   CHOLHDL 3.0 12/08/2015   No results found for: HGBA1C Lab Results  Component Value Date   VITAMINB12 362 12/08/2015   Lab Results  Component Value Date   TSH 1.370 05/20/2014    No flowsheet data found.   No flowsheet data found.   ASSESSMENT AND PLAN  40 y.o. year old female  has a past medical history of Abnormal Pap smear, Anxiety, Arthritis, Chronic back pain, DDD (degenerative disc disease), lumbar, Headache, Herniated disc, Ovarian cyst, and Spondylisthesis. here with   No diagnosis found.    No orders of the defined types were placed in this encounter.    No orders of the defined types were placed in this  encounter.     I spent 20 minutes of face-to-face and non-face-to-face time with patient.  This included previsit chart review, lab review, study review, order entry, electronic health record documentation, patient education.    Debbora Presto, MSN, FNP-C 10/25/2020, 4:22 PM  [facility] [address] [place], [place] [address] [phone]

## 2020-10-25 NOTE — Telephone Encounter (Signed)
I spoke with pt in regards to her appointment tomorrow 10/25/20. She is also seeing another neurologist at [facility] and does not need to see two, per Amy. She decided no cancel her appointment with [place] and FU with them. I advised her to call the office with further questions, she understood.

## 2020-10-25 NOTE — Patient Instructions (Incomplete)
Below is our plan:  We will ***  Please make sure you are staying well hydrated. I recommend 50-60 ounces daily. Well balanced diet and regular exercise encouraged. Consistent sleep schedule with 6-8 hours recommended.   Please continue follow up with care team as directed.   Follow up with *** in ***  You may receive a survey regarding today's visit. I encourage you to leave honest feed back as I do use this information to improve patient care. Thank you for seeing me today!      Migraine Headache A migraine headache is a very strong throbbing pain on one side or both sides of your head. This type of headache can also cause other symptoms. It can last from 4 hours to 3 days. Talk with your doctor about what things may bring on (trigger) this condition. What are the causes? The exact cause of this condition is not known. This condition may be triggered or caused by:  Drinking alcohol.  Smoking.  Taking medicines, such as: ? Medicine used to treat chest pain (nitroglycerin). ? Birth control pills. ? Estrogen. ? Some blood pressure medicines.  Eating or drinking certain products.  Doing physical activity. Other things that may trigger a migraine headache include:  Having a menstrual period.  Pregnancy.  Hunger.  Stress.  Not getting enough sleep or getting too much sleep.  Weather changes.  Tiredness (fatigue). What increases the risk?  Being 25-55 years old.  Being female.  Having a family history of migraine headaches.  Being Caucasian.  Having depression or anxiety.  Being very overweight. What are the signs or symptoms?  A throbbing pain. This pain may: ? Happen in any area of the head, such as on one side or both sides. ? Make it hard to do daily activities. ? Get worse with physical activity. ? Get worse around bright lights or loud noises.  Other symptoms may include: ? Feeling sick to your stomach  (nauseous). ? Vomiting. ? Dizziness. ? Being sensitive to bright lights, loud noises, or smells.  Before you get a migraine headache, you may get warning signs (an aura). An aura may include: ? Seeing flashing lights or having blind spots. ? Seeing bright spots, halos, or zigzag lines. ? Having tunnel vision or blurred vision. ? Having numbness or a tingling feeling. ? Having trouble talking. ? Having weak muscles.  Some people have symptoms after a migraine headache (postdromal phase), such as: ? Tiredness. ? Trouble thinking (concentrating). How is this treated?  Taking medicines that: ? Relieve pain. ? Relieve the feeling of being sick to your stomach. ? Prevent migraine headaches.  Treatment may also include: ? Having acupuncture. ? Avoiding foods that bring on migraine headaches. ? Learning ways to control your body functions (biofeedback). ? Therapy to help you know and deal with negative thoughts (cognitive behavioral therapy). Follow these instructions at home: Medicines  Take over-the-counter and prescription medicines only as told by your doctor.  Ask your doctor if the medicine prescribed to you: ? Requires you to avoid driving or using heavy machinery. ? Can cause trouble pooping (constipation). You may need to take these steps to prevent or treat trouble pooping:  Drink enough fluid to keep your pee (urine) pale yellow.  Take over-the-counter or prescription medicines.  Eat foods that are high in fiber. These include beans, whole grains, and fresh fruits and vegetables.  Limit foods that are high in fat and sugar. These include fried or sweet foods. Lifestyle    Do not drink alcohol.  Do not use any products that contain nicotine or tobacco, such as cigarettes, e-cigarettes, and chewing tobacco. If you need help quitting, ask your doctor.  Get at least 8 hours of sleep every night.  Limit and deal with stress. General instructions  Keep a journal to  find out what may bring on your migraine headaches. For example, write down: ? What you eat and drink. ? How much sleep you get. ? Any change in what you eat or drink. ? Any change in your medicines.  If you have a migraine headache: ? Avoid things that make your symptoms worse, such as bright lights. ? It may help to lie down in a dark, quiet room. ? Do not drive or use heavy machinery. ? Ask your doctor what activities are safe for you.  Keep all follow-up visits as told by your doctor. This is important.      Contact a doctor if:  You get a migraine headache that is different or worse than others you have had.  You have more than 15 headache days in one month. Get help right away if:  Your migraine headache gets very bad.  Your migraine headache lasts longer than 72 hours.  You have a fever.  You have a stiff neck.  You have trouble seeing.  Your muscles feel weak or like you cannot control them.  You start to lose your balance a lot.  You start to have trouble walking.  You pass out (faint).  You have a seizure. Summary  A migraine headache is a very strong throbbing pain on one side or both sides of your head. These headaches can also cause other symptoms.  This condition may be treated with medicines and changes to your lifestyle.  Keep a journal to find out what may bring on your migraine headaches.  Contact a doctor if you get a migraine headache that is different or worse than others you have had.  Contact your doctor if you have more than 15 headache days in a month. This information is not intended to replace advice given to you by your health care provider. Make sure you discuss any questions you have with your health care provider. Document Revised: 12/18/2018 Document Reviewed: 10/08/2018 Elsevier Patient Education  2021 [employer]vier Inc.  

## 2020-10-26 ENCOUNTER — Ambulatory Visit: Payer: PRIVATE HEALTH INSURANCE | Admitting: Family Medicine

## 2020-11-24 ENCOUNTER — Other Ambulatory Visit (HOSPITAL_COMMUNITY): Payer: Self-pay | Admitting: Family

## 2020-11-24 ENCOUNTER — Other Ambulatory Visit: Payer: Self-pay | Admitting: Family

## 2020-11-24 DIAGNOSIS — R1011 Right upper quadrant pain: Secondary | ICD-10-CM

## 2020-12-01 ENCOUNTER — Ambulatory Visit (HOSPITAL_COMMUNITY): Payer: BC Managed Care – PPO

## 2020-12-01 ENCOUNTER — Encounter (HOSPITAL_COMMUNITY): Payer: Self-pay

## 2021-01-02 ENCOUNTER — Ambulatory Visit: Payer: BC Managed Care – PPO | Admitting: Physical Therapy

## 2021-01-09 ENCOUNTER — Ambulatory Visit: Payer: PRIVATE HEALTH INSURANCE | Admitting: Physical Therapy

## 2021-01-19 ENCOUNTER — Ambulatory Visit: Payer: BC Managed Care – PPO | Attending: Family | Admitting: Physical Therapy

## 2021-01-19 ENCOUNTER — Encounter: Payer: Self-pay | Admitting: Physical Therapy

## 2021-01-19 ENCOUNTER — Other Ambulatory Visit: Payer: Self-pay

## 2021-01-19 DIAGNOSIS — M542 Cervicalgia: Secondary | ICD-10-CM | POA: Diagnosis present

## 2021-01-19 DIAGNOSIS — R293 Abnormal posture: Secondary | ICD-10-CM

## 2021-01-19 NOTE — Therapy (Signed)
[facility] [place], [place], [address] Phone: [phone]   Fax:  [phone]  Physical Therapy Evaluation  Patient Details  Name: Yesenia Gates MRN: [id number] Date of Birth: 08-23-1981 Referring Provider (PT): Loletha Grayer FNP.   Encounter Date: 01/19/2021   PT End of Session - 01/19/21 1102    Visit Number 1    Number of Visits 12    Date for PT Re-Evaluation 03/02/21    PT Start Time 0815    PT Stop Time 0903    PT Time Calculation (min) 48 min    Activity Tolerance Patient tolerated treatment well    Behavior During Therapy [facility] for tasks assessed/performed           Past Medical History:  Diagnosis Date  . Abnormal Pap smear   . Anxiety   . Arthritis   . Chronic back pain   . DDD (degenerative disc disease), lumbar   . Headache   . Herniated disc   . Ovarian cyst   . Spondylisthesis     Past Surgical History:  Procedure Laterality Date  . dilation and cutterage      There were no vitals filed for this visit.    Subjective Assessment - 01/19/21 1103    Subjective COVID-19 screen performed prior to patient entering clinic.  The patient presents to the clinic today with a CC of neck pain and headaches.  She had Chiropractic care in the past but feels it may have made her worse.  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  We discussed the importance of good lumbar support, good head and and neck posture and a good pillow for undisturbed sleep.  Her resting pain-level today is a 4/10 but her pain can reach severe levels when her headaches are intense.  At these times she also feels dizziness.    Pertinent History Chronic back pain, DDD.    Diagnostic tests X-ray.    Patient Stated Goals Get rid of headaches.    Currently in Pain? Yes    Pain Score 4     Pain Location Neck    Pain Orientation Right;Left    Pain Descriptors / Indicators Aching;Sharp;Numbness;Throbbing     Pain Type Chronic pain    Pain Onset More than a month ago    Pain Frequency Constant    Aggravating Factors  Prolonged sitting, sleeping, certain movements.    Pain Relieving Factors Rest.              [facility] PT Assessment - 01/19/21 0001      Assessment   Medical Diagnosis Cervical pain.    Referring Provider (PT) Loletha Grayer FNP.    Onset Date/Surgical Date --   Ongoing.     Precautions   Precautions None      Restrictions   Weight Bearing Restrictions No      Balance Screen   Has the patient fallen in the past 6 months No    Has the patient had a decrease in activity level because of a fear of falling?  Yes    Is the patient reluctant to leave their home because of a fear of falling?  No      Home Environment   Living Environment Private residence      Prior Function   Level of Independence Independent      Deep Tendon Reflexes   DTR Assessment Site Biceps;Brachioradialis;Triceps    Biceps  DTR 2+    Brachioradialis DTR 2+    Triceps DTR 2+      ROM / Strength   AROM / PROM / Strength AROM;Strength      AROM   Overall AROM Comments Right active cervical rotation is 90 degrees and left is 70 degrees.      Strength   Overall Strength Comments Normal UE strength.      Palpation   Palpation comment Tender to palpation over bilateral suboccipital musculature, SCM's near mastoid processes attachment, cervical paraspinal musculature and bilateral UT's.      Ambulation/Gait   Gait Comments WNL.                      Objective measurements completed on examination: See above findings.       [facility] Adult PT Treatment/Exercise - 01/19/21 0001      Modalities   Modalities Electrical Stimulation;Moist Heat      Moist Heat Therapy   Number Minutes Moist Heat 20 Minutes    Moist Heat Location Cervical      Electrical Stimulation   Electrical Stimulation Location Bialteral cervical paraspinal musculature.    Electrical Stimulation Action  Low-level IFC at 80-150 Hz.    Electrical Stimulation Parameters 40% scan x 20 minutes.    Electrical Stimulation Goals Pain;Tone                  PT Education - 01/19/21 1227    Education Details Chin tucks and extension.    Person(s) Educated Patient    Methods Explanation;Demonstration    Comprehension Verbalized understanding;Returned demonstration               PT Long Term Goals - 01/19/21 1228      PT LONG TERM GOAL #1   Title Independent with a HEP.    Time 6    Period Weeks    Status New      PT LONG TERM GOAL #2   Title Active left cervical rotation to 80 degrees+.    Time 6    Period Weeks    Status New      PT LONG TERM GOAL #3   Title No more than one headache per week with a decrease in intensity of at least 50%.    Time 6    Period Weeks    Status New                  Plan - 01/19/21 1222    Clinical Impression Statement The patient presents to [facility] with c/o neck pain and headaches.  She has diffuse cervical musculature pain beginning in her suboccipital region and into her UT's and SCM's.  She has a loss of left active cervical rotation.  Her headaches are frequent and severe.  We discussed the importance of correct posture and a good pillow for sleeping.  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    Personal Factors and Comorbidities Comorbidity 1;Other    Comorbidities Chronic back pain, DDD.    Examination-Activity Limitations Other;Sit    Examination-Participation Restrictions Occupation;Other    Stability/Clinical Decision Making Evolving/Moderate complexity    Clinical Decision Making Low    Rehab Potential Good    PT Frequency 2x / week    PT Duration 6 weeks    PT Treatment/Interventions Cryotherapy;ADLs/Self Care Home Management;Electrical Stimulation;Ultrasound;Traction;Moist Heat;Therapeutic activities;Therapeutic  exercise;Manual techniques;Patient/family education;Passive range of motion;Dry needling    PT Next Visit Plan Small soundhead combo e'stim/US, STW/M to include suboccpital release technique, chin tucks and extension, postural exercise progression.    Consulted and Agree with Plan of Care Patient           Patient will benefit from skilled therapeutic intervention in order to improve the following deficits and impairments:  Postural dysfunction,Pain,Decreased activity tolerance,Increased muscle spasms,Decreased range of motion  Visit Diagnosis: Cervicalgia - Plan: PT plan of care cert/re-cert  Abnormal posture - Plan: PT plan of care cert/re-cert     Problem List Patient Active Problem List   Diagnosis Date Noted  . Obesity (BMI 30-39.9) 02/20/2016  . Allergic rhinitis 07/17/2015  . Current smoker 07/17/2015  . Vitamin D deficiency 11/29/2014  . Hyperlipidemia 11/29/2014    Amandine Covino, [place] MPT 01/19/2021, 12:32 PM  [facility] [address] [place], [place], [address] Phone: [phone]   Fax:  [phone]  Name: KIEREN RICCI MRN: [id number] Date of Birth: 25-Jan-1981

## 2021-01-26 ENCOUNTER — Ambulatory Visit: Payer: BC Managed Care – PPO | Admitting: Physical Therapy

## 2021-01-26 ENCOUNTER — Other Ambulatory Visit: Payer: Self-pay

## 2021-01-26 ENCOUNTER — Encounter: Payer: Self-pay | Admitting: Physical Therapy

## 2021-01-26 DIAGNOSIS — M542 Cervicalgia: Secondary | ICD-10-CM

## 2021-01-26 DIAGNOSIS — R293 Abnormal posture: Secondary | ICD-10-CM

## 2021-01-26 NOTE — Therapy (Signed)
[facility] [place], [place], [address] Phone: [phone]   Fax:  [phone]  Physical Therapy Treatment  Patient Details  Name: Yesenia Gates MRN: [id number] Date of Birth: 1981/03/25 Referring Provider (PT): Loletha Grayer FNP.   Encounter Date: 01/26/2021   PT End of Session - 01/26/21 3295    Visit Number 2    Number of Visits 12    Date for PT Re-Evaluation 03/02/21    PT Start Time 0903    PT Stop Time 0944    PT Time Calculation (min) 41 min    Activity Tolerance Patient tolerated treatment well    Behavior During Therapy [facility] for tasks assessed/performed           Past Medical History:  Diagnosis Date  . Abnormal Pap smear   . Anxiety   . Arthritis   . Chronic back pain   . DDD (degenerative disc disease), lumbar   . Headache   . Herniated disc   . Ovarian cyst   . Spondylisthesis     Past Surgical History:  Procedure Laterality Date  . dilation and cutterage      There were no vitals filed for this visit.   Subjective Assessment - 01/26/21 0902    Subjective COVID 19 screening performed on patient upon arrival. Pain is okay in the mornings but as the day goes on the pain gets worse. Headaches occuring every other day.    Pertinent History Chronic back pain, DDD.    Diagnostic tests X-ray.    Patient Stated Goals Get rid of headaches.    Currently in Pain? Yes    Pain Score 5     Pain Location Neck    Pain Orientation Right;Left    Pain Descriptors / Indicators Aching;Tightness    Pain Type Chronic pain    Pain Onset More than a month ago    Pain Frequency Constant              [facility] PT Assessment - 01/26/21 0001      Assessment   Medical Diagnosis Cervical pain.    Referring Provider (PT) Loletha Grayer FNP.      Precautions   Precautions None                         [facility] Adult PT Treatment/Exercise - 01/26/21 0001      Modalities    Modalities Electrical Stimulation;Moist Heat;Ultrasound      Moist Heat Therapy   Number Minutes Moist Heat 15 Minutes    Moist Heat Location Cervical      Electrical Stimulation   Electrical Stimulation Location B UT, cervical paraspinals    Electrical Stimulation Action Pre-Mod    Electrical Stimulation Parameters 80-150 hz x15 min    Electrical Stimulation Goals Pain;Tone      Ultrasound   Ultrasound Location B UT    Ultrasound Parameters Combo 1.5 w/cm2, 100%,1 mhz x10 min    Ultrasound Goals Pain      Manual Therapy   Manual Therapy Soft tissue mobilization    Soft tissue mobilization STW to B UT, levator scapula, cervical paraspinals to reduce tone and pain                       PT Long Term Goals - 01/19/21 1228      PT LONG TERM GOAL #1   Title Independent with a HEP.  Time 6    Period Weeks    Status New      PT LONG TERM GOAL #2   Title Active left cervical rotation to 80 degrees+.    Time 6    Period Weeks    Status New      PT LONG TERM GOAL #3   Title No more than one headache per week with a decrease in intensity of at least 50%.    Time 6    Period Weeks    Status New                 Plan - 01/26/21 9509    Clinical Impression Statement Patient presented in clinic with reports of continued neck related pain and corresponding headaches. Patient presented with increased pain and tone in cervical paraspinals as well as UT. Patient educated related posture, pillows for comfort while at home and in her office chair. Normal modalities response noted following removal of the modalities. Patient indicated some relief following end of treatment.    Personal Factors and Comorbidities Comorbidity 1;Other    Comorbidities Chronic back pain, DDD.    Examination-Activity Limitations Other;Sit    Examination-Participation Restrictions Occupation;Other    Stability/Clinical Decision Making Evolving/Moderate complexity    Rehab Potential Good     PT Frequency 2x / week    PT Duration 6 weeks    PT Treatment/Interventions Cryotherapy;ADLs/Self Care Home Management;Electrical Stimulation;Ultrasound;Traction;Moist Heat;Therapeutic activities;Therapeutic exercise;Manual techniques;Patient/family education;Passive range of motion;Dry needling    PT Next Visit Plan Small soundhead combo e'stim/US, STW/M to include suboccpital release technique, chin tucks and extension, postural exercise progression.    Consulted and Agree with Plan of Care Patient           Patient will benefit from skilled therapeutic intervention in order to improve the following deficits and impairments:  Postural dysfunction,Pain,Decreased activity tolerance,Increased muscle spasms,Decreased range of motion  Visit Diagnosis: Cervicalgia  Abnormal posture     Problem List Patient Active Problem List   Diagnosis Date Noted  . Obesity (BMI 30-39.9) 02/20/2016  . Allergic rhinitis 07/17/2015  . Current smoker 07/17/2015  . Vitamin D deficiency 11/29/2014  . Hyperlipidemia 11/29/2014    Standley Brooking, PTA 01/26/2021, 9:54 AM  [facility] [address] [place], [place], [address] Phone: [phone]   Fax:  [phone]  Name: Yesenia Gates MRN: [id number] Date of Birth: 1981/03/22

## 2021-02-02 ENCOUNTER — Ambulatory Visit: Payer: BC Managed Care – PPO | Admitting: Physical Therapy

## 2021-02-02 ENCOUNTER — Encounter: Payer: Self-pay | Admitting: Physical Therapy

## 2021-02-02 ENCOUNTER — Other Ambulatory Visit: Payer: Self-pay

## 2021-02-02 DIAGNOSIS — M542 Cervicalgia: Secondary | ICD-10-CM

## 2021-02-02 DIAGNOSIS — R293 Abnormal posture: Secondary | ICD-10-CM
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
## 2021-02-09 ENCOUNTER — Other Ambulatory Visit: Payer: Self-pay

## 2021-02-09 ENCOUNTER — Encounter: Payer: Self-pay | Admitting: Physical Therapy

## 2021-02-09 ENCOUNTER — Ambulatory Visit: Payer: BC Managed Care – PPO | Attending: Family | Admitting: Physical Therapy

## 2021-02-09 DIAGNOSIS — M542 Cervicalgia: Secondary | ICD-10-CM

## 2021-02-09 DIAGNOSIS — R293 Abnormal posture: Secondary | ICD-10-CM | POA: Insufficient documentation

## 2021-02-09 NOTE — Therapy (Signed)
[facility] [place], [place], [address] Phone: [phone]   Fax:  [phone]  Physical Therapy Treatment  Patient Details  Name: Yesenia Gates MRN: [id number] Date of Birth: May 17, 1981 Referring Provider (PT): Loletha Grayer FNP.   Encounter Date: 02/09/2021   PT End of Session - 02/09/21 1151    Visit Number 4    Number of Visits 12    Date for PT Re-Evaluation 03/02/21    PT Start Time 0900    PT Stop Time 0952    PT Time Calculation (min) 52 min    Activity Tolerance Patient tolerated treatment well    Behavior During Therapy [facility] for tasks assessed/performed           Past Medical History:  Diagnosis Date  . Abnormal Pap smear   . Anxiety   . Arthritis   . Chronic back pain   . DDD (degenerative disc disease), lumbar   . Headache   . Herniated disc   . Ovarian cyst   . Spondylisthesis     Past Surgical History:  Procedure Laterality Date  . dilation and cutterage      There were no vitals filed for this visit.   Subjective Assessment - 02/09/21 1109    Subjective COVID-19 screen performed prior to patient entering clinic.  Doing better.  No dizziness or headaches today.    Pertinent History Chronic back pain, DDD.    Diagnostic tests X-ray.    Patient Stated Goals Get rid of headaches.    Currently in Pain? Yes    Pain Score 1     Pain Location Neck    Pain Orientation Right;Left    Pain Descriptors / Indicators Tightness    Pain Onset More than a month ago                             [facility] Adult PT Treatment/Exercise - 02/09/21 0001      Modalities   Modalities Electrical Stimulation;Moist Heat      Moist Heat Therapy   Number Minutes Moist Heat 20 Minutes    Moist Heat Location Cervical      Electrical Stimulation   Electrical Stimulation Location Affected cervical.    Electrical Stimulation Action IFC at 80-150 Hz.    Electrical Stimulation Parameters  40% scan x 20 minutes.    Electrical Stimulation Goals Pain;Tone      Ultrasound   Ultrasound Location Affected cervical.    Ultrasound Parameters Combo e'stim/US at 1.50 w/CM2 x 12 minutes.    Ultrasound Goals Pain      Manual Therapy   Manual Therapy Soft tissue mobilization    Soft tissue mobilization STW/M x 12 minutes to patient affected cervical musculature.                       PT Long Term Goals - 01/19/21 1228      PT LONG TERM GOAL #1   Title Independent with a HEP.    Time 6    Period Weeks    Status New      PT LONG TERM GOAL #2   Title Active left cervical rotation to 80 degrees+.    Time 6    Period Weeks    Status New      PT LONG TERM GOAL #3   Title No more than one headache per week with a decrease in  intensity of at least 50%.    Time 6    Period Weeks    Status New                 Plan - 02/09/21 1114    Clinical Impression Statement The patient is doing very well.  No headaches reported.    Personal Factors and Comorbidities Comorbidity 1;Other    Comorbidities Chronic back pain, DDD.    Examination-Activity Limitations Other;Sit    Examination-Participation Restrictions Occupation;Other    Stability/Clinical Decision Making Evolving/Moderate complexity    Rehab Potential Good    PT Frequency 2x / week    PT Duration 6 weeks    PT Treatment/Interventions Cryotherapy;ADLs/Self Care Home Management;Electrical Stimulation;Ultrasound;Traction;Moist Heat;Therapeutic activities;Therapeutic exercise;Manual techniques;Patient/family education;Passive range of motion;Dry needling    PT Next Visit Plan Small soundhead combo e'stim/US, STW/M to include suboccpital release technique, chin tucks and extension, postural exercise progression.    Consulted and Agree with Plan of Care Patient           Patient will benefit from skilled therapeutic intervention in order to improve the following deficits and impairments:     Visit  Diagnosis: Cervicalgia  Abnormal posture     Problem List Patient Active Problem List   Diagnosis Date Noted  . Obesity (BMI 30-39.9) 02/20/2016  . Allergic rhinitis 07/17/2015  . Current smoker 07/17/2015  . Vitamin D deficiency 11/29/2014  . Hyperlipidemia 11/29/2014    Shalen Petrak, [place]  MPT 02/09/2021, 11:54 AM  [facility] [address] [place], [place], [address] Phone: [phone]   Fax:  [phone]  Name: Yesenia Gates MRN: [id number] Date of Birth: October 21, 1980

## 2021-02-16 ENCOUNTER — Ambulatory Visit: Payer: BC Managed Care – PPO | Admitting: Physical Therapy

## 2021-02-23 ENCOUNTER — Ambulatory Visit: Payer: BC Managed Care – PPO | Admitting: *Deleted

## 2021-02-28 ENCOUNTER — Ambulatory Visit: Payer: BC Managed Care – PPO

## 2021-02-28 ENCOUNTER — Other Ambulatory Visit: Payer: Self-pay

## 2021-02-28 DIAGNOSIS — R293 Abnormal posture: Secondary | ICD-10-CM

## 2021-02-28 DIAGNOSIS — M542 Cervicalgia: Secondary | ICD-10-CM | POA: Diagnosis not present

## 2021-02-28 NOTE — Patient Instructions (Signed)
Head Press With [place] chin SLIGHTLY toward chest, keep mouth closed. Feel weight on back of head. Increase weight by pressing head down. Hold ___ seconds. Relax. Repeat ___ times. Surface: floor   Copyright  VHI. All rights reserved.   Axial Extension (Chin Tuck)    Pull chin in and lengthen back of neck. Hold ____ seconds while counting out loud. Repeat ____ times. Do ____ sessions per day.  http://gt2.exer.us/450   Copyright  VHI. All rights reserved.

## 2021-02-28 NOTE — Therapy (Signed)
[facility] [place], [place], [address] Phone: [phone]   Fax:  [phone]  Physical Therapy Treatment  Patient Details  Name: Yesenia Gates MRN: [id number] Date of Birth: February 10, 1981 Referring Provider (PT): Loletha Grayer FNP.   Encounter Date: 02/28/2021   PT End of Session - 02/28/21 0821     Visit Number 5    Number of Visits 12    Date for PT Re-Evaluation 03/02/21    PT Start Time 0815    PT Stop Time 0853    PT Time Calculation (min) 38 min    Activity Tolerance Patient tolerated treatment well    Behavior During Therapy WFL for tasks assessed/performed             Past Medical History:  Diagnosis Date   Abnormal Pap smear    Anxiety    Arthritis    Chronic back pain    DDD (degenerative disc disease), lumbar    Headache    Herniated disc    Ovarian cyst    Spondylisthesis     Past Surgical History:  Procedure Laterality Date   dilation and cutterage      There were no vitals filed for this visit.   Subjective Assessment - 02/28/21 0817     Subjective COVID-19 screen performed prior to patient entering clinic.  Feeling good today, no reports of , dizziness or headache currently.  Reports some stiffness in mid back.  Reports 2 headaches and no longer lightheadedness since began therapy.    Pertinent History Chronic back pain, DDD.    Patient Stated Goals Get rid of headaches.    Currently in Pain? No/denies                [facility] PT Assessment - 02/28/21 0001       Assessment   Medical Diagnosis Cervical pain.    Referring Provider (PT) Loletha Grayer FNP.    Onset Date/Surgical Date --   Ongoing   Next MD Visit unsure date, a few months                           [facility] Adult PT Treatment/Exercise - 02/28/21 0001       Exercises   Exercises Neck      Neck Exercises: Seated   Neck Retraction 10 reps;3 secs;5 secs    Neck Retraction  Limitations improved mechanics with L in front of chin    X to V 5 reps    Other Seated Exercise 3D cervical/ thoracic excursion (flexion, lateral bend and rotation) with UE movement 10x      Manual Therapy   Manual Therapy Soft tissue mobilization    Soft tissue mobilization STM to cervical mm in prone; suboccipital release and manual traction in supine position                         PT Long Term Goals - 01/19/21 1228       PT LONG TERM GOAL #1   Title Independent with a HEP.    Time 6    Period Weeks    Status New      PT LONG TERM GOAL #2   Title Active left cervical rotation to 80 degrees+.    Time 6    Period Weeks    Status New      PT LONG TERM GOAL #3   Title  No more than one headache per week with a decrease in intensity of at least 50%.    Time 6    Period Weeks    Status New                   Plan - 02/28/21 5573     Clinical Impression Statement Added cervical/thoracic mobility exericses following reports of mid back stiffness.  Reviewed mechanics with proper chin tuck with verbal and tactile cueing, improve mechanics with L in front of chin.  EOS with manual soft tissue mobilization to address moderate tightness in upper trap, levator and peri-scapular mm noted Lt>Rt with reports of relief following.  Encouraged hydration following manual to reduce risk of headache.    Personal Factors and Comorbidities Comorbidity 1;Other    Comorbidities Chronic back pain, DDD.    Examination-Activity Limitations Other;Sit    Stability/Clinical Decision Making Evolving/Moderate complexity    Clinical Decision Making Low    Rehab Potential Good    PT Frequency 2x / week    PT Duration 6 weeks    PT Treatment/Interventions Cryotherapy;ADLs/Self Care Home Management;Electrical Stimulation;Ultrasound;Traction;Moist Heat;Therapeutic activities;Therapeutic exercise;Manual techniques;Patient/family education;Passive range of motion;Dry needling    PT Next  Visit Plan Re-eval next session.  Small soundhead combo e'stim/US, STW/M to include suboccpital release technique, chin tucks and extension, postural exercise progression.    PT Home Exercise Plan 6/22: supine and seated chin tucks.    Consulted and Agree with Plan of Care Patient             Patient will benefit from skilled therapeutic intervention in order to improve the following deficits and impairments:  Postural dysfunction, Pain, Decreased activity tolerance, Increased muscle spasms, Decreased range of motion  Visit Diagnosis: Cervicalgia  Abnormal posture     Problem List Patient Active Problem List   Diagnosis Date Noted   Obesity (BMI 30-39.9) 02/20/2016   Allergic rhinitis 07/17/2015   Current smoker 07/17/2015   Vitamin D deficiency 11/29/2014   Hyperlipidemia 11/29/2014   Ihor Austin, LPTA/CLT; CBIS [phone]  Aldona Lento 02/28/2021, 9:04 AM  [facility] [place], [place], [address] Phone: [phone]   Fax:  [phone]  Name: SALENA ORTLIEB MRN: [id number] Date of Birth: 1981-07-26

## 2021-03-02 ENCOUNTER — Encounter: Payer: Self-pay | Admitting: Physical Therapy

## 2021-03-02 ENCOUNTER — Other Ambulatory Visit: Payer: Self-pay

## 2021-03-02 ENCOUNTER — Ambulatory Visit: Payer: BC Managed Care – PPO | Admitting: Physical Therapy

## 2021-03-02 DIAGNOSIS — M542 Cervicalgia: Secondary | ICD-10-CM

## 2021-03-02 DIAGNOSIS — R293 Abnormal posture: Secondary | ICD-10-CM

## 2021-03-02 NOTE — Therapy (Signed)
[facility] [place], [place], [address] Phone: [phone]   Fax:  [phone]  Physical Therapy Treatment  Patient Details  Name: Yesenia Gates MRN: [id number] Date of Birth: 12-08-1980 Referring Provider (PT): Loletha Grayer FNP.   Encounter Date: 03/02/2021   PT End of Session - 03/02/21 0817     Visit Number 6    Number of Visits 12    Date for PT Re-Evaluation 03/02/21    PT Start Time 0818    PT Stop Time 0902    PT Time Calculation (min) 44 min    Activity Tolerance Patient tolerated treatment well    Behavior During Therapy WFL for tasks assessed/performed             Past Medical History:  Diagnosis Date   Abnormal Pap smear    Anxiety    Arthritis    Chronic back pain    DDD (degenerative disc disease), lumbar    Headache    Herniated disc    Ovarian cyst    Spondylisthesis     Past Surgical History:  Procedure Laterality Date   dilation and cutterage      There were no vitals filed for this visit.   Subjective Assessment - 03/02/21 0816     Subjective COVID 19 screening performed on patient upon arrival. Patient reported more pain in suboccipitals region last night and a more deep pain while lying down.    Pertinent History Chronic back pain, DDD.    Diagnostic tests X-ray.    Patient Stated Goals Get rid of headaches.    Currently in Pain? Yes    Pain Score 3     Pain Location Neck    Pain Orientation Right;Left;Proximal    Pain Descriptors / Indicators Discomfort    Pain Type Chronic pain    Pain Onset More than a month ago    Pain Frequency Constant                [facility] PT Assessment - 03/02/21 0001       Assessment   Medical Diagnosis Cervical pain.    Referring Provider (PT) Loletha Grayer FNP.    Next MD Visit unsure date, a few months      Precautions   Precautions None      Restrictions   Weight Bearing Restrictions No                            [facility] Adult PT Treatment/Exercise - 03/02/21 0001       Modalities   Modalities Electrical Stimulation;Ultrasound      Moist Heat Therapy   Number Minutes Moist Heat 15 Minutes    Moist Heat Location Cervical      Electrical Stimulation   Electrical Stimulation Location B UT, mid trap    Electrical Stimulation Action IFC    Electrical Stimulation Parameters 80-150 hz x15 min    Electrical Stimulation Goals Pain;Tone      Ultrasound   Ultrasound Location B cervical paraspinals, UT    Ultrasound Parameters Combo 1.5 w/cm2, 100%, 1 mhz x10 min    Ultrasound Goals Pain      Manual Therapy   Manual Therapy Soft tissue mobilization    Soft tissue mobilization STW to B UT, cervical paraspinals, suboccipitals to reduce tone and pain  PT Long Term Goals - 01/19/21 1228       PT LONG TERM GOAL #1   Title Independent with a HEP.    Time 6    Period Weeks    Status New      PT LONG TERM GOAL #2   Title Active left cervical rotation to 80 degrees+.    Time 6    Period Weeks    Status New      PT LONG TERM GOAL #3   Title No more than one headache per week with a decrease in intensity of at least 50%.    Time 6    Period Weeks    Status New                   Plan - 03/02/21 0916     Clinical Impression Statement Patient presented in clinic with continued cervical pain along base of suboccipitals. Patient now experiencing headaches up to 3x weekly. Greater sensitivity to B UT today and moderate tone of UT, suboccipitals. Patient reports that if she is experiencing a headache, she gets her son to massage her back and feels relief with pressure to mid trap, rhomboids. Normal modalities response noted following removal of the modalities.    Personal Factors and Comorbidities Comorbidity 1;Other    Comorbidities Chronic back pain, DDD.    Examination-Activity Limitations Other;Sit     Examination-Participation Restrictions Occupation;Other    Stability/Clinical Decision Making Evolving/Moderate complexity    Rehab Potential Good    PT Frequency 2x / week    PT Duration 6 weeks    PT Treatment/Interventions Cryotherapy;ADLs/Self Care Home Management;Electrical Stimulation;Ultrasound;Traction;Moist Heat;Therapeutic activities;Therapeutic exercise;Manual techniques;Patient/family education;Passive range of motion;Dry needling    PT Next Visit Plan Re-eval next session.  Small soundhead combo e'stim/US, STW/M to include suboccpital release technique, chin tucks and extension, postural exercise progression.    PT Home Exercise Plan 6/22: supine and seated chin tucks.    Consulted and Agree with Plan of Care Patient             Patient will benefit from skilled therapeutic intervention in order to improve the following deficits and impairments:  Postural dysfunction, Pain, Decreased activity tolerance, Increased muscle spasms, Decreased range of motion  Visit Diagnosis: Cervicalgia  Abnormal posture     Problem List Patient Active Problem List   Diagnosis Date Noted   Obesity (BMI 30-39.9) 02/20/2016   Allergic rhinitis 07/17/2015   Current smoker 07/17/2015   Vitamin D deficiency 11/29/2014   Hyperlipidemia 11/29/2014    Standley Brooking, PTA 03/02/2021, 9:23 AM  [facility] [address] [place], [place], [address] Phone: [phone]   Fax:  [phone]  Name: Yesenia Gates MRN: [id number] Date of Birth: 1981-06-21

## 2021-03-09 ENCOUNTER — Ambulatory Visit: Payer: BC Managed Care – PPO | Admitting: Physical Therapy

## 2021-03-15 ENCOUNTER — Ambulatory Visit (HOSPITAL_COMMUNITY): Payer: BC Managed Care – PPO

## 2021-03-26 ENCOUNTER — Other Ambulatory Visit: Payer: Self-pay

## 2021-03-26 ENCOUNTER — Encounter: Payer: Self-pay | Admitting: Physical Therapy

## 2021-03-26 ENCOUNTER — Ambulatory Visit: Payer: BC Managed Care – PPO | Attending: Family | Admitting: Physical Therapy

## 2021-03-26 DIAGNOSIS — M542 Cervicalgia: Secondary | ICD-10-CM | POA: Diagnosis not present

## 2021-03-26 DIAGNOSIS — R293 Abnormal posture: Secondary | ICD-10-CM | POA: Diagnosis present

## 2021-03-26 NOTE — Therapy (Signed)
[facility] [place], [place], [address] Phone: [phone]   Fax:  [phone]  Physical Therapy Treatment  Patient Details  Name: Yesenia Gates MRN: [id number] Date of Birth: 11/24/1980 Referring Provider (PT): Loletha Grayer FNP.   Encounter Date: 03/26/2021   PT End of Session - 03/26/21 0811     Visit Number 7    Number of Visits 12    Date for PT Re-Evaluation 03/02/21    PT Start Time 0733    PT Stop Time 0817    PT Time Calculation (min) 44 min    Activity Tolerance Patient tolerated treatment well    Behavior During Therapy WFL for tasks assessed/performed             Past Medical History:  Diagnosis Date   Abnormal Pap smear    Anxiety    Arthritis    Chronic back pain    DDD (degenerative disc disease), lumbar    Headache    Herniated disc    Ovarian cyst    Spondylisthesis     Past Surgical History:  Procedure Laterality Date   dilation and cutterage      There were no vitals filed for this visit.   Subjective Assessment - 03/26/21 0806     Subjective COVID 19 screening performed on patient upon arrival. Patient reports that she was just diagnosed with B vestibular weakness. Same symptoms being experienced but patient states that PT for cervical spine helped the dizziness and lightheaded symptoms she was experiencing.    Pertinent History Chronic back pain, DDD.    Diagnostic tests X-ray.    Patient Stated Goals Get rid of headaches.    Currently in Pain? Yes    Pain Score 5     Pain Location Neck    Pain Orientation Right;Left;Proximal    Pain Descriptors / Indicators Sharp;Aching    Pain Type Chronic pain    Pain Onset More than a month ago    Pain Frequency Constant                [facility] PT Assessment - 03/26/21 0001       Assessment   Medical Diagnosis Cervical pain.    Referring Provider (PT) Loletha Grayer FNP.    Next MD Visit unsure date, a few months       Precautions   Precautions None                           [facility] Adult PT Treatment/Exercise - 03/26/21 0001       Modalities   Modalities Electrical Stimulation;Ultrasound;Moist Heat      Moist Heat Therapy   Number Minutes Moist Heat 15 Minutes    Moist Heat Location Cervical      Electrical Stimulation   Electrical Stimulation Location B UT, mid trap    Electrical Stimulation Action IFC    Electrical Stimulation Parameters 80-150 hz x15 min    Electrical Stimulation Goals Pain;Tone      Ultrasound   Ultrasound Location B UT, cervical paraspinals    Ultrasound Parameters Combo 1.5 w/cm2, 100%, 1 mhz x10 min    Ultrasound Goals Pain      Manual Therapy   Manual Therapy Soft tissue mobilization    Soft tissue mobilization STW to B UT, cervical paraspinals, suboccipitals, rhomboids to reduce tone and pain  PT Long Term Goals - 01/19/21 1228       PT LONG TERM GOAL #1   Title Independent with a HEP.    Time 6    Period Weeks    Status New      PT LONG TERM GOAL #2   Title Active left cervical rotation to 80 degrees+.    Time 6    Period Weeks    Status New      PT LONG TERM GOAL #3   Title No more than one headache per week with a decrease in intensity of at least 50%.    Time 6    Period Weeks    Status New                   Plan - 03/26/21 0816     Clinical Impression Statement Patient presented in clinic with continued cervical pain but states that PT treatment has helped some with her dizziness and lightheaded symptoms. Patient reporting that the ENT appointment had been scheduled for quite a while. More muscle tightness and tone palpable in L cervical musclature today. Patient experienced less muscle tightness following manual therapy session. Normal modalities response noted following removal of the modalities.    Personal Factors and Comorbidities Comorbidity 1;Other    Comorbidities  Chronic back pain, DDD.    Examination-Activity Limitations Other;Sit    Examination-Participation Restrictions Occupation;Other    Stability/Clinical Decision Making Evolving/Moderate complexity    Rehab Potential Good    PT Frequency 2x / week    PT Duration 6 weeks    PT Treatment/Interventions Cryotherapy;ADLs/Self Care Home Management;Electrical Stimulation;Ultrasound;Traction;Moist Heat;Therapeutic activities;Therapeutic exercise;Manual techniques;Patient/family education;Passive range of motion;Dry needling    PT Next Visit Plan Re-eval next session.  Small soundhead combo e'stim/US, STW/M to include suboccpital release technique, chin tucks and extension, postural exercise progression.    PT Home Exercise Plan 6/22: supine and seated chin tucks.    Consulted and Agree with Plan of Care Patient             Patient will benefit from skilled therapeutic intervention in order to improve the following deficits and impairments:  Postural dysfunction, Pain, Decreased activity tolerance, Increased muscle spasms, Decreased range of motion  Visit Diagnosis: Cervicalgia  Abnormal posture     Problem List Patient Active Problem List   Diagnosis Date Noted   Obesity (BMI 30-39.9) 02/20/2016   Allergic rhinitis 07/17/2015   Current smoker 07/17/2015   Vitamin D deficiency 11/29/2014   Hyperlipidemia 11/29/2014    Standley Brooking, PTA 03/26/2021, 8:39 AM  [facility] [place], [place], [address] Phone: [phone]   Fax:  [phone]  Name: ANISHKA BUSHARD MRN: [id number] Date of Birth: 1980-09-25

## 2021-03-28 ENCOUNTER — Encounter: Payer: Self-pay | Admitting: Physical Therapy

## 2021-03-28 ENCOUNTER — Ambulatory Visit: Payer: BC Managed Care – PPO | Admitting: Physical Therapy

## 2021-03-28 ENCOUNTER — Other Ambulatory Visit: Payer: Self-pay

## 2021-03-28 DIAGNOSIS — M542 Cervicalgia: Secondary | ICD-10-CM | POA: Diagnosis not present

## 2021-03-28 DIAGNOSIS — R293 Abnormal posture: Secondary | ICD-10-CM

## 2021-03-28 NOTE — Therapy (Signed)
[facility] [place], [place], [address] Phone: [phone]   Fax:  [phone]  Physical Therapy Treatment  Patient Details  Name: Yesenia Gates MRN: [id number] Date of Birth: 1981/01/13 Referring Provider (PT): Loletha Grayer FNP.   Encounter Date: 03/28/2021   PT End of Session - 03/28/21 0736     Visit Number 8    Number of Visits 12    Date for PT Re-Evaluation 04/09/21    PT Start Time 0736    PT Stop Time 0817    PT Time Calculation (min) 41 min    Activity Tolerance Patient tolerated treatment well    Behavior During Therapy WFL for tasks assessed/performed             Past Medical History:  Diagnosis Date   Abnormal Pap smear    Anxiety    Arthritis    Chronic back pain    DDD (degenerative disc disease), lumbar    Headache    Herniated disc    Ovarian cyst    Spondylisthesis     Past Surgical History:  Procedure Laterality Date   dilation and cutterage      There were no vitals filed for this visit.   Subjective Assessment - 03/28/21 0734     Subjective COVID 19 screening performed on patient upon arrival. Still the same and more pain R side yesterday from ear to shoulder and RLE.    Pertinent History Chronic back pain, DDD.    Diagnostic tests X-ray.    Patient Stated Goals Get rid of headaches.    Currently in Pain? Yes    Pain Score 3     Pain Location Neck    Pain Orientation Right    Pain Descriptors / Indicators Discomfort    Pain Type Chronic pain    Pain Radiating Towards R shoulder    Pain Onset More than a month ago    Pain Frequency Constant                [facility] PT Assessment - 03/28/21 0001       Assessment   Medical Diagnosis Cervical pain.    Referring Provider (PT) Loletha Grayer FNP.    Next MD Visit unsure date, a few months      Precautions   Precautions None                           [facility] Adult PT Treatment/Exercise  - 03/28/21 0001       Modalities   Modalities Electrical Stimulation;Ultrasound;Moist Heat      Moist Heat Therapy   Number Minutes Moist Heat 15 Minutes    Moist Heat Location Cervical      Electrical Stimulation   Electrical Stimulation Location R UT, mid trap    Electrical Stimulation Action IFC    Electrical Stimulation Parameters 80-150 hz x15 min    Electrical Stimulation Goals Pain;Tone      Ultrasound   Ultrasound Location R cervical paraspinals, UT    Ultrasound Parameters Combo 1.5 w/cm2, 100%, 1 mhz x10 min    Ultrasound Goals Pain      Manual Therapy   Manual Therapy Soft tissue mobilization    Soft tissue mobilization STW to R UT, cervical paraspinals, suboccipitals, rhomboids to reduce tone and pain  PT Long Term Goals - 01/19/21 1228       PT LONG TERM GOAL #1   Title Independent with a HEP.    Time 6    Period Weeks    Status New      PT LONG TERM GOAL #2   Title Active left cervical rotation to 80 degrees+.    Time 6    Period Weeks    Status New      PT LONG TERM GOAL #3   Title No more than one headache per week with a decrease in intensity of at least 50%.    Time 6    Period Weeks    Status New                   Plan - 03/28/21 0840     Clinical Impression Statement Patient presented in clinic with reports of more R cervical and shoulder pain. Patient has been to multiple visits with OBGYN, ENT and awaiting an appointment for vestibular weakness. Patient reports pinched nerve dx after an MRI and traction is apart of the POC but not completed as patient has reported lightheadedness, equilibrium deficits at times but has improved since beginning PT. Minimal to moderate muscle tightness of the R UT and suboccipitals region noted during manual therapy. Normal modalities response noted following removal of the modalities.    Personal Factors and Comorbidities Comorbidity 1;Other    Comorbidities  Chronic back pain, DDD.    Examination-Activity Limitations Other;Sit    Examination-Participation Restrictions Occupation;Other    Stability/Clinical Decision Making Evolving/Moderate complexity    Rehab Potential Good    PT Frequency 2x / week    PT Duration 6 weeks    PT Treatment/Interventions Cryotherapy;ADLs/Self Care Home Management;Electrical Stimulation;Ultrasound;Traction;Moist Heat;Therapeutic activities;Therapeutic exercise;Manual techniques;Patient/family education;Passive range of motion;Dry needling    PT Next Visit Plan As symptoms tolerate    PT Home Exercise Plan 6/22: supine and seated chin tucks.    Consulted and Agree with Plan of Care Patient             Patient will benefit from skilled therapeutic intervention in order to improve the following deficits and impairments:  Postural dysfunction, Pain, Decreased activity tolerance, Increased muscle spasms, Decreased range of motion  Visit Diagnosis: Cervicalgia  Abnormal posture     Problem List Patient Active Problem List   Diagnosis Date Noted   Obesity (BMI 30-39.9) 02/20/2016   Allergic rhinitis 07/17/2015   Current smoker 07/17/2015   Vitamin D deficiency 11/29/2014   Hyperlipidemia 11/29/2014    Standley Brooking, PTA 03/28/2021, 9:12 AM  [facility] [address] [place], [place], [address] Phone: [phone]   Fax:  [phone]  Name: Yesenia Gates MRN: [id number] Date of Birth: [birth year]

## 2021-04-06 ENCOUNTER — Other Ambulatory Visit: Payer: Self-pay

## 2021-04-06 ENCOUNTER — Encounter: Payer: Self-pay | Admitting: Physical Therapy

## 2021-04-06 ENCOUNTER — Ambulatory Visit: Payer: BC Managed Care – PPO | Admitting: Physical Therapy

## 2021-04-06 DIAGNOSIS — R293 Abnormal posture: Secondary | ICD-10-CM

## 2021-04-06 DIAGNOSIS — M542 Cervicalgia: Secondary | ICD-10-CM | POA: Diagnosis not present

## 2021-04-06 NOTE — Therapy (Signed)
[facility] [place], [place], [address] Phone: [phone]   Fax:  [phone]  Physical Therapy Treatment  Patient Details  Name: Yesenia Gates MRN: [id number] Date of Birth: 1981/06/03 Referring Provider (PT): Loletha Grayer FNP.   Encounter Date: 04/06/2021   PT End of Session - 04/06/21 0818     Visit Number 9    Number of Visits 12    Date for PT Re-Evaluation 04/09/21    PT Start Time 0818    PT Stop Time 0846   requested no modalities   PT Time Calculation (min) 28 min    Activity Tolerance Patient tolerated treatment well;Patient limited by fatigue    Behavior During Therapy WFL for tasks assessed/performed             Past Medical History:  Diagnosis Date   Abnormal Pap smear    Anxiety    Arthritis    Chronic back pain    DDD (degenerative disc disease), lumbar    Headache    Herniated disc    Ovarian cyst    Spondylisthesis     Past Surgical History:  Procedure Laterality Date   dilation and cutterage      There were no vitals filed for this visit.   Subjective Assessment - 04/06/21 0817     Subjective COVID 19 screening performed on patient upon arrival. Headaches have been getting better but a minimal headache in the L temple today. No neck pain upon arrival.    Pertinent History Chronic back pain, DDD.    Diagnostic tests X-ray.    Patient Stated Goals Get rid of headaches.    Currently in Pain? No/denies                [facility] PT Assessment - 04/06/21 0001       Assessment   Medical Diagnosis Cervical pain.    Referring Provider (PT) Loletha Grayer FNP.    Next MD Visit unsure date, a few months      Precautions   Precautions None                           [facility] Adult PT Treatment/Exercise - 04/06/21 0001       Neck Exercises: Machines for Strengthening   UBE (Upper Arm Bike) 120 RPM x6 min (forward/backward)      Neck Exercises:  Theraband   Shoulder Extension 20 reps;Limitations    Shoulder Extension Limitations green XTS    Rows 20 reps;Limitations    Rows Limitations green XTS    Shoulder External Rotation 20 reps;Limitations    Shoulder External Rotation Limitations yellow theraband    Horizontal ABduction 15 reps;Limitations    Horizontal ABduction Limitations yellow theraband      Neck Exercises: Standing   Wall Push Ups 15 reps    Upper Extremity D2 Flexion;10 reps;Theraband    Theraband Level (UE D2) Level 1 (Yellow)                    PT Education - 04/06/21 0849     Education Details HEP from [place]    Person(s) Educated Patient    Methods Explanation;Demonstration;Handout    Comprehension Verbalized understanding;Returned demonstration                 PT Long Term Goals - 01/19/21 1228       PT LONG TERM GOAL #1   Title Independent  with a HEP.    Time 6    Period Weeks    Status New      PT LONG TERM GOAL #2   Title Active left cervical rotation to 80 degrees+.    Time 6    Period Weeks    Status New      PT LONG TERM GOAL #3   Title No more than one headache per week with a decrease in intensity of at least 50%.    Time 6    Period Weeks    Status New                   Plan - 04/06/21 [id number]     Clinical Impression Statement Patient presented in clinic with reports of overall improvements regarding headaches and neck pain. Patient progressed to postural strengthening exercises with in depth instruction for postural awareness and correction to alleviate neck pain. Patient reported UE weakness with light therex. Patient provided new HEP with postural and educated via technique and parameters. Patient also provided yellow theraband for HEP. Patient opted for no modalities but has heating pad at home if required. Patient did report some muscle soreness after therex session.    Personal Factors and Comorbidities Comorbidity 1;Other     Comorbidities Chronic back pain, DDD.    Examination-Activity Limitations Other;Sit    Examination-Participation Restrictions Occupation;Other    Stability/Clinical Decision Making Evolving/Moderate complexity    Rehab Potential Good    PT Frequency 2x / week    PT Duration 6 weeks    PT Treatment/Interventions Cryotherapy;ADLs/Self Care Home Management;Electrical Stimulation;Ultrasound;Traction;Moist Heat;Therapeutic activities;Therapeutic exercise;Manual techniques;Patient/family education;Passive range of motion;Dry needling    PT Next Visit Plan As symptoms tolerate    PT Home Exercise Plan 6/22: supine and seated chin tucks.    Consulted and Agree with Plan of Care Patient             Patient will benefit from skilled therapeutic intervention in order to improve the following deficits and impairments:  Postural dysfunction, Pain, Decreased activity tolerance, Increased muscle spasms, Decreased range of motion  Visit Diagnosis: Cervicalgia  Abnormal posture     Problem List Patient Active Problem List   Diagnosis Date Noted   Obesity (BMI 30-39.9) 02/20/2016   Allergic rhinitis 07/17/2015   Current smoker 07/17/2015   Vitamin D deficiency 11/29/2014   Hyperlipidemia 11/29/2014    Standley Brooking, PTA 04/06/2021, 9:29 AM  [facility] [address] [place], [place], [address] Phone: [phone]   Fax:  [phone]  Name: Yesenia Gates MRN: [id number] Date of Birth: 03-24-1981

## 2021-04-12 IMAGING — CT CT ANGIO NECK
2 of 11 series · 7 of 33 positions shown · IV contrast (Omnipaque or Isovue)
Comparison: Prior study from 04/25/2020.

CLINICAL DATA: Initial evaluation for intermittent headaches for 2
months.

EXAM:
CT ANGIOGRAPHY HEAD AND NECK
TECHNIQUE: Multidetector CT imaging of the head and neck was performed using
the standard protocol during bolus administration of intravenous
contrast. Multiplanar CT image reconstructions and MIPs were
obtained to evaluate the vascular anatomy. Carotid stenosis
measurements (when applicable) are obtained utilizing NASCET
criteria, using the distal internal carotid diameter as the
denominator.
CONTRAST:  100mL OMNIPAQUE IOHEXOL 350 MG/ML SOLN

[Series 9: cta head & neck · axial · 0.47mm/px · z∈[-78,+36]mm · 2 of 172 slices shown]
[im 58/172  soft-tissue]
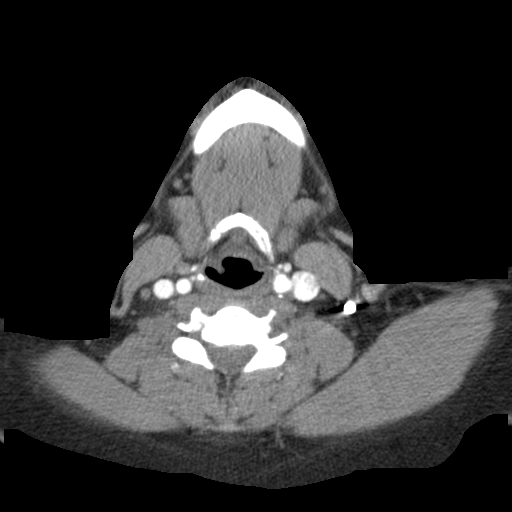
[im 115/172  bone]
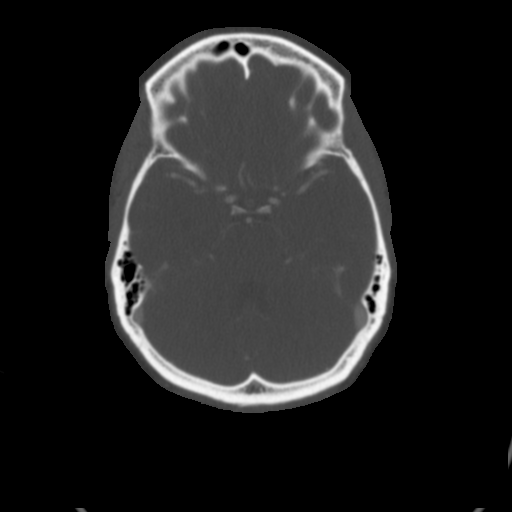

[Series 11: ax thins · axial · 0.39mm/px · z∈[-137,+81]mm · 5 of 328 slices shown]
[im 55/328  soft-tissue]
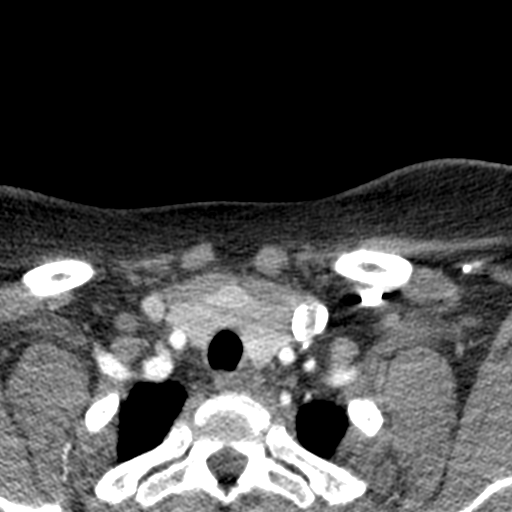
[im 110/328  soft-tissue]
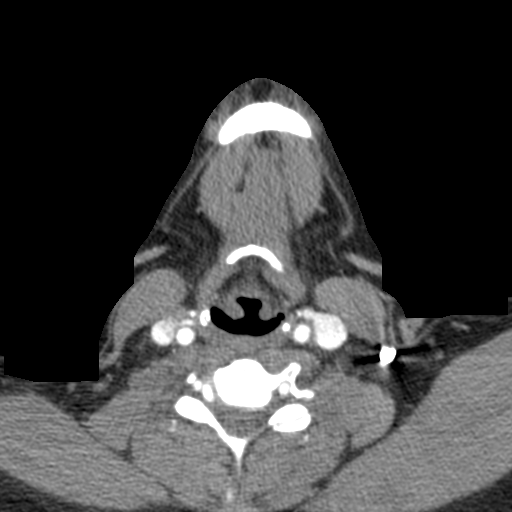
[im 164/328  soft-tissue]
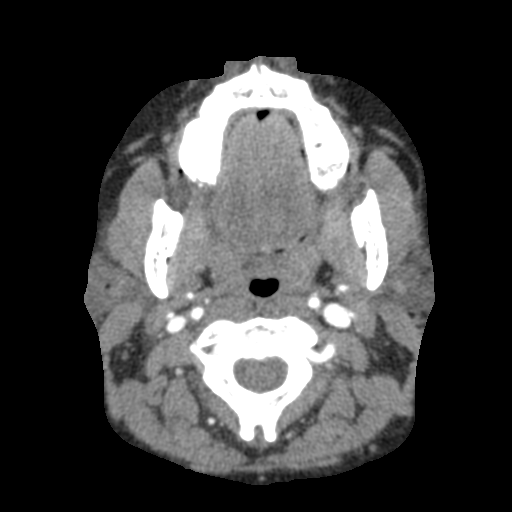
[im 219/328  soft-tissue]
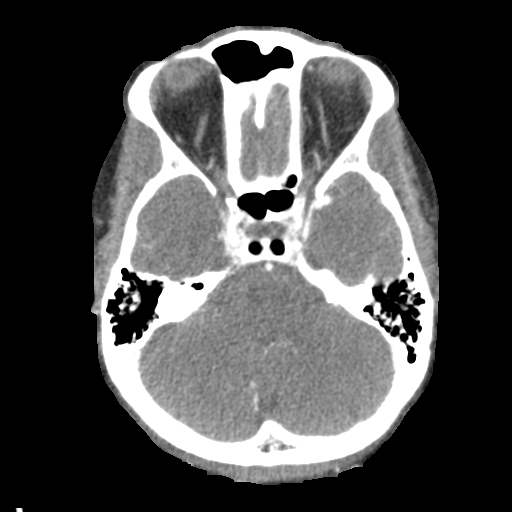
[im 273/328  soft-tissue]
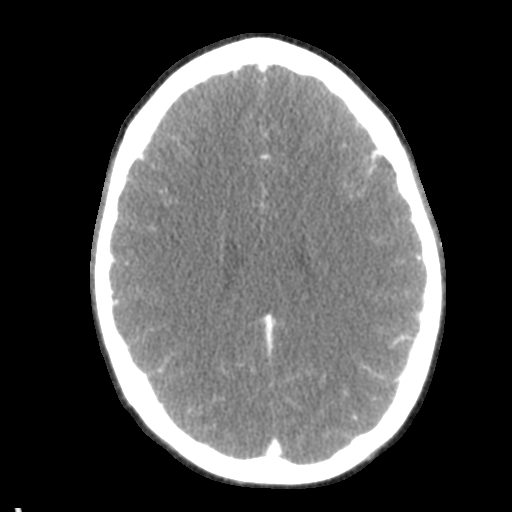

[7 of 33 positions shown; findings below may reference images not displayed]

FINDINGS: CT HEAD FINDINGS

Brain: Cerebral volume within normal limits for patient age.

No evidence for acute intracranial hemorrhage. No findings to
suggest acute large vessel territory infarct. No mass lesion,
midline shift, or mass effect. Ventricles are normal in size without
evidence for hydrocephalus. No extra-axial fluid collection
identified.

Vascular: No hyperdense vessel identified.

Skull: Scalp soft tissues demonstrate no acute abnormality.
Calvarium intact.

Sinuses/Orbits: Globes and orbital soft tissues within normal
limits.

Visualized paranasal sinuses are clear. No mastoid effusion.

CTA NECK FINDINGS

Aortic arch: Visualized aortic arch of normal caliber with normal
branch pattern. No flow-limiting stenosis or other acute abnormality
about the origin of the great vessels.

Right carotid system: Right common and internal carotid arteries
widely patent without stenosis, dissection or occlusion.

Left carotid system: Left common carotid artery widely patent from
its origin to the bifurcation. Minor eccentric plaque at the left
bifurcation without significant stenosis. Left ICA widely patent
distally without stenosis, dissection or occlusion.

Vertebral arteries: Both vertebral arteries arise from the
subclavian arteries. Vertebral arteries widely patent without
stenosis, dissection or occlusion.

Skeleton: No acute osseous abnormality. No discrete or worrisome
osseous lesions.

Other neck: No other acute soft tissue abnormality within the neck.
No mass lesion or adenopathy.

Upper chest: Visualized upper chest demonstrates no acute finding.

Review of the MIP images confirms the above findings

CTA HEAD FINDINGS

Anterior circulation: Both internal carotid arteries widely patent
to the termini without stenosis or other abnormality. A1 segments
patent bilaterally. Normal anterior communicating artery complex.
Anterior cerebral arteries widely patent to their distal aspects. No
M1 stenosis or occlusion. Normal left MCA bifurcation. On the right,
there is a 2 mm blush of contrast seen adjacent to the right MCA
bifurcation (series 11, image 99). Finding is favored to be related
to the underlying venous vasculature which partially obscures this
region. A small aneurysm is difficult to exclude. Distal MCA
branches well perfused and symmetric.

Posterior circulation: Both vertebral arteries widely patent to the
vertebrobasilar junction without stenosis. Left vertebral artery
slightly dominant. Patent left PICA. Right PICA not definitely seen.
Basilar widely patent to its distal aspect. Superior cerebral
arteries patent bilaterally. Both PCAs primarily supplied via the
basilar well perfused to their distal aspects.

Venous sinuses: Patent.

Anatomic variants: None significant.

Review of the MIP images confirms the above findings
IMPRESSION: CT HEAD IMPRESSION:

Normal head CT.  No acute intracranial abnormality identified.

CTA HEAD AND NECK IMPRESSION:

1. 2 mm rounded blush of contrast at the level of the right MCA
bifurcation, indeterminate, but favored to be secondary to adjacent
venous contamination. A small aneurysm is difficult to exclude.
Further assessment with dedicated MRA suggested for further
characterization.
2. Otherwise normal CTA of the head and neck. No large vessel
occlusion, hemodynamically significant stenosis, or other acute
vascular abnormality.

## 2021-04-13 ENCOUNTER — Ambulatory Visit: Payer: BC Managed Care – PPO | Attending: Family | Admitting: *Deleted

## 2021-04-13 DIAGNOSIS — R293 Abnormal posture: Secondary | ICD-10-CM | POA: Insufficient documentation

## 2021-04-13 DIAGNOSIS — M542 Cervicalgia: Secondary | ICD-10-CM | POA: Insufficient documentation

## 2021-04-26 ENCOUNTER — Ambulatory Visit: Payer: BC Managed Care – PPO | Admitting: Physical Therapy

## 2021-04-27 ENCOUNTER — Other Ambulatory Visit: Payer: Self-pay

## 2021-04-27 ENCOUNTER — Ambulatory Visit: Payer: BC Managed Care – PPO | Admitting: Physical Therapy

## 2021-04-27 NOTE — Therapy (Signed)
[facility] [place], [place], [address] Phone: [phone]   Fax:  [phone]  Patient Details  Name: Yesenia Gates MRN: [id number] Date of Birth: Sep 25, 1980 Referring Provider:  Drue Second I*  Encounter Date: 04/27/2021  Patient is transferring to a clinic that provides vestibular rehab.   Ashten Sarnowski, [place] MPT 04/27/2021, 9:34 AM  [facility] [address] [place], [place], [address] Phone: [phone]   Fax:  [phone]

## 2021-06-03 ENCOUNTER — Emergency Department (HOSPITAL_COMMUNITY)
Admission: EM | Admit: 2021-06-03 | Discharge: 2021-06-03 | Disposition: A | Discharge status: Home / Self Care | Payer: BC Managed Care – PPO | Attending: Emergency Medicine | Admitting: Emergency Medicine

## 2021-06-03 ENCOUNTER — Emergency Department (HOSPITAL_COMMUNITY): Payer: BC Managed Care – PPO

## 2021-06-03 ENCOUNTER — Encounter (HOSPITAL_COMMUNITY): Payer: Self-pay | Admitting: *Deleted

## 2021-06-03 DIAGNOSIS — F17210 Nicotine dependence, cigarettes, uncomplicated: Secondary | ICD-10-CM | POA: Diagnosis not present

## 2021-06-03 DIAGNOSIS — M79602 Pain in left arm: Secondary | ICD-10-CM | POA: Diagnosis not present

## 2021-06-03 DIAGNOSIS — R519 Headache, unspecified: Secondary | ICD-10-CM | POA: Diagnosis not present

## 2021-06-03 NOTE — Discharge Instructions (Signed)
The testing that was done today is completely normal.  The CAT scans of your head and cervical spine did not show any problems to explain your headache.  This is very reassuring and indicates that it is safe to go home.  I recommend that you use Tylenol 650 mg, every 4 hours as needed for pain.  Also use a heating pad on the areas of discomfort including your head and left arm can help.

## 2021-06-03 NOTE — ED Provider Notes (Signed)
[facility] EMERGENCY DEPARTMENT Provider Note   CSN: [id number] Arrival date & time: 06/03/21  1113     History Chief Complaint  Patient presents with   Headache   Shoulder Pain    STEPHAN Gates is a 40 y.o. female. She complains of a headache which started within the last 24 hours.  She states it feels different than her prior "vestibular migraines."  Today she took Ativan, and Lexapro to help with the discomfort.  She states the headache is bilateral occiput region.  She also has noticed pain in her left upper arm.  She denies aggravation of the pain in her head or her arm, by movement of the neck.  She denies trauma.  She is not having fever, chills, nausea, vomiting, cough, chest pain, difficulty walking.  She has recently been seen and cleared by her neurologist, after an extensive evaluation for headaches.  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  She is currently going to physical therapy for that.  She is not using other medications for the headache episodes.  She states she is not interested in pursuing medications for the pain, but states that she wants to have testing done to be evaluated for it.  The patient has had comprehensive testing and evaluation by her neurologist including labs and MRI imaging.  The MRI was done in April 2022, results visible in the EMR, and they were normal.  There are no other known active modifying factors     Past Medical History:  Diagnosis Date   Abnormal Pap smear    Anxiety    Arthritis    Chronic back pain    DDD (degenerative disc disease), lumbar    Headache    Herniated disc    Ovarian cyst    Spondylisthesis     Patient Active Problem List   Diagnosis Date Noted   Obesity (BMI 30-39.9) 02/20/2016   Allergic rhinitis 07/17/2015   Current smoker 07/17/2015   Vitamin D deficiency 11/29/2014   Hyperlipidemia 11/29/2014    Past Surgical History:  Procedure  Laterality Date   dilation and cutterage       OB History     Gravida  2   Para  1   Term  1   Preterm      AB      Living         SAB      IAB      Ectopic      Multiple      Live Births              Family History  Problem Relation Age of Onset   Diabetes Mother    Cancer Mother        cervical   Rheum arthritis Mother    Diabetes Father    Cancer Other    Diabetes Sister    Diabetes Brother    Cancer Maternal Uncle        throat   Stroke Maternal Uncle    Cancer Maternal Grandmother        GYN   Heart attack Maternal Grandfather    Cancer Cousin        cervical   Breast cancer Cousin    Cancer Maternal Uncle        throat   Cancer Cousin        breast   Cancer Cousin  cervical    Social History   Tobacco Use   Smoking status: Every Day    Packs/day: 1.00    Types: Cigarettes   Smokeless tobacco: Never  Vaping Use   Vaping Use: Never used  Substance Use Topics   Alcohol use: Yes    Comment: occasionally   Drug use: No    Home Medications Prior to Admission medications   Medication Sig Start Date End Date Taking? Authorizing Provider  escitalopram (LEXAPRO) 10 MG tablet Take 1 tablet (10 mg total) by mouth daily. 04/04/16   Hassell Done, Mary-Margaret, FNP  Fremanezumab-vfrm (AJOVY) 225 MG/1.5ML SOAJ Inject 225 mg into the skin every 30 (thirty) days. 05/17/20   Penumalli, Earlean Polka, MD  LORazepam (ATIVAN) 0.5 MG tablet Take 0.5 mg by mouth daily as needed for anxiety.     [provider]  Multiple Vitamin (MULTIVITAMIN) tablet Take 1 tablet by mouth daily.    [provider]  Rimegepant Sulfate (NURTEC) 75 MG TBDP Take 75 mg by mouth daily as needed. 05/17/20   Penumalli, Earlean Polka, MD  rizatriptan (MAXALT-MLT) 10 MG disintegrating tablet Take 1 tablet (10 mg total) by mouth as needed for migraine. May repeat in 2 hours if needed 05/17/20   Penumalli, Earlean Polka, MD  topiramate (TOPAMAX) 50 MG tablet Take 1 tablet (50 mg  total) by mouth 2 (two) times daily. 05/17/20   Penumalli, Earlean Polka, MD    Allergies    Penicillins  Review of Systems   Review of Systems  All other systems reviewed and are negative.  Physical Exam Updated Vital Signs BP 124/80 (BP Location: Right Arm)   Pulse 87   Temp 98.5 F (36.9 C)   Resp 16   Ht 5\' 6"  (1.676 m)   Wt 86.5 kg   SpO2 100%   BMI 30.78 kg/m   Physical Exam Vitals and nursing note reviewed.  Constitutional:      General: She is not in acute distress.    Appearance: She is well-developed. She is not ill-appearing, toxic-appearing or diaphoretic.  HENT:     Head: Normocephalic and atraumatic.     Right Ear: External ear normal.     Left Ear: External ear normal.     Nose: No congestion.  Eyes:     Conjunctiva/sclera: Conjunctivae normal.     Pupils: Pupils are equal, round, and reactive to light.  Neck:     Trachea: Phonation normal.  Cardiovascular:     Rate and Rhythm: Normal rate and regular rhythm.     Heart sounds: Normal heart sounds.  Pulmonary:     Effort: Pulmonary effort is normal.     Breath sounds: Normal breath sounds.  Abdominal:     Palpations: Abdomen is soft.     Tenderness: There is no abdominal tenderness.  Musculoskeletal:        General: No swelling, tenderness, deformity or signs of injury. Normal range of motion.     Cervical back: Normal range of motion and neck supple. No rigidity or tenderness.  Lymphadenopathy:     Cervical: No cervical adenopathy.  Skin:    General: Skin is warm and dry.  Neurological:     Mental Status: She is alert and oriented to person, place, and time.     Cranial Nerves: No cranial nerve deficit.     Sensory: No sensory deficit.     Motor: No abnormal muscle tone.     Coordination: Coordination normal.  Psychiatric:  Behavior: Behavior normal.        Thought Content: Thought content normal.        Judgment: Judgment normal.    ED Results / Procedures / Treatments   Labs (all  labs ordered are listed, but only abnormal results are displayed) Labs Reviewed - No data to display  EKG EKG Interpretation  Date/Time:  Sunday June 03 2021 11:27:34 EDT Ventricular Rate:  105 PR Interval:  157 QRS Duration: 89 QT Interval:  351 QTC Calculation: 464 R Axis:   90 Text Interpretation: Sinus tachycardia Borderline right axis deviation since last tracing no significant change Confirmed by Daleen Bo [phone]) on 06/03/2021 12:29:41 PM  Radiology CT Head Wo Contrast  Result Date: 06/03/2021 CLINICAL DATA:  Altered mental status EXAM: CT HEAD WITHOUT CONTRAST CT CERVICAL SPINE WITHOUT CONTRAST TECHNIQUE: Multidetector CT imaging of the head and cervical spine was performed following the standard protocol without intravenous contrast. Multiplanar CT image reconstructions of the cervical spine were also generated. COMPARISON:  04/25/2020 FINDINGS: CT HEAD FINDINGS Brain: No evidence of acute infarction, hemorrhage, hydrocephalus, extra-axial collection or mass lesion/mass effect. Vascular: No hyperdense vessel or unexpected calcification. Skull: Normal. Negative for fracture or focal lesion. Sinuses/Orbits: No acute finding. Other: None. CT CERVICAL SPINE FINDINGS Alignment: Normal. Skull base and vertebrae: No acute fracture. No primary bone lesion or focal pathologic process. Soft tissues and spinal canal: No prevertebral fluid or swelling. No visible canal hematoma. Disc levels: Intact. Upper chest: Negative. Other: None. IMPRESSION: 1. No acute intracranial pathology. 2. No fracture or static subluxation of the cervical spine. Electronically Signed   By: Eddie Candle M.D.   On: 06/03/2021 13:42   CT Cervical Spine Wo Contrast  Result Date: 06/03/2021 CLINICAL DATA:  Altered mental status EXAM: CT HEAD WITHOUT CONTRAST CT CERVICAL SPINE WITHOUT CONTRAST TECHNIQUE: Multidetector CT imaging of the head and cervical spine was performed following the standard protocol without  intravenous contrast. Multiplanar CT image reconstructions of the cervical spine were also generated. COMPARISON:  04/25/2020 FINDINGS: CT HEAD FINDINGS Brain: No evidence of acute infarction, hemorrhage, hydrocephalus, extra-axial collection or mass lesion/mass effect. Vascular: No hyperdense vessel or unexpected calcification. Skull: Normal. Negative for fracture or focal lesion. Sinuses/Orbits: No acute finding. Other: None. CT CERVICAL SPINE FINDINGS Alignment: Normal. Skull base and vertebrae: No acute fracture. No primary bone lesion or focal pathologic process. Soft tissues and spinal canal: No prevertebral fluid or swelling. No visible canal hematoma. Disc levels: Intact. Upper chest: Negative. Other: None. IMPRESSION: 1. No acute intracranial pathology. 2. No fracture or static subluxation of the cervical spine. Electronically Signed   By: Eddie Candle M.D.   On: 06/03/2021 13:42    Procedures Procedures   Medications Ordered in ED Medications - No data to display  ED Course  I have reviewed the triage vital signs and the nursing notes.  Pertinent labs & imaging results that were available during my care of the patient were reviewed by me and considered in my medical decision making (see chart for details).    MDM Rules/Calculators/A&P                            Patient Vitals for the past 24 hrs:  BP Temp Pulse Resp SpO2 Height Weight  06/03/21 1329 124/80 -- 87 16 100 % -- --  06/03/21 1226 124/83 -- 96 18 100 % -- --  06/03/21 1130 122/77 -- (!) 104 14 100 % -- --  06/03/21 1121 -- -- -- -- -- 5\' 6"  (1.676 m) 86.5 kg  06/03/21 1117 136/78 98.5 F (36.9 C) (!) 118 18 100 % -- --    2:11 PM Reevaluation with update and discussion. After initial assessment and treatment, an updated evaluation reveals she remains comfortable in appearance.  She has no further complaints.  Findings discussed with the patient and all questions were answered. Daleen Bo   Medical Decision  Making:  This patient is presenting for evaluation of recurrent headache, which does require a range of treatment options, and is a complaint that involves a moderate risk of morbidity and mortality. The differential diagnoses include tension headache, migraine headache, stress reaction. I decided to review old records, and in summary millage female with prior headaches, presenting with persistent headache, following recent comprehensive evaluation by neurology and ENT..  I did not require additional historical information from anyone.   Radiologic Tests Ordered, included CT head and cervical spine.  I independently Visualized: Radiograph images, which show normal finding    Critical Interventions-clinical evaluation, radiography, reevaluation, discussion with patient  After These Interventions, the Patient was reevaluated and was found stable for discharge.  Patient with nonspecific clinical syndrome, and reassuring evaluation both here and previously, recently.  I suspect that she is having stress or tension related symptoms.  No indication for hospitalization or further ED treatment at this time  CRITICAL CARE-no Performed by: Daleen Bo  Nursing Notes Reviewed/ Care Coordinated Applicable Imaging Reviewed Interpretation of Laboratory Data incorporated into ED treatment  The patient appears reasonably screened and/or stabilized for discharge and I doubt any other medical condition or other [facility] requiring further screening, evaluation, or treatment in the ED at this time prior to discharge.  Plan: Home Medications-use Tylenol for pain and continue routine medication; Home Treatments-heat to affected area; return here if the recommended treatment, does not improve the symptoms; Recommended follow up-PCP, prn     Final Clinical Impression(s) / ED Diagnoses Final diagnoses:  Nonintractable episodic headache, unspecified headache type  Left arm pain    Rx / DC Orders ED Discharge  Orders          Ordered    VAS [place] LOWER EXTREMITY VENOUS (DVT)  Status:  Canceled        06/03/21 1303             Daleen Bo, MD 06/03/21 [phone]

## 2021-06-03 NOTE — ED Notes (Signed)
Patient transported to CT 

## 2021-06-03 NOTE — ED Triage Notes (Signed)
C/o pain in back of head and feels like a heart beat in left shoulder onset this am

## 2021-06-09 DIAGNOSIS — Z419 Encounter for procedure for purposes other than remedying health state, unspecified: Secondary | ICD-10-CM | POA: Diagnosis not present

## 2021-06-11 ENCOUNTER — Other Ambulatory Visit: Payer: Self-pay

## 2021-06-11 ENCOUNTER — Ambulatory Visit (HOSPITAL_COMMUNITY)
Admission: RE | Admit: 2021-06-11 | Discharge: 2021-06-11 | Disposition: A | Discharge status: Home / Self Care | Payer: BC Managed Care – PPO | Source: Ambulatory Visit | Attending: Family | Admitting: Family

## 2021-06-11 DIAGNOSIS — R1011 Right upper quadrant pain: Secondary | ICD-10-CM | POA: Diagnosis not present

## 2021-07-10 DIAGNOSIS — Z419 Encounter for procedure for purposes other than remedying health state, unspecified: Secondary | ICD-10-CM | POA: Diagnosis not present

## 2021-07-21 DIAGNOSIS — B3731 Acute candidiasis of vulva and vagina: Secondary | ICD-10-CM | POA: Diagnosis not present

## 2021-07-21 DIAGNOSIS — N898 Other specified noninflammatory disorders of vagina: Secondary | ICD-10-CM | POA: Diagnosis not present

## 2021-07-29 DIAGNOSIS — M40202 Unspecified kyphosis, cervical region: Secondary | ICD-10-CM | POA: Diagnosis not present

## 2021-07-29 DIAGNOSIS — R059 Cough, unspecified: Secondary | ICD-10-CM | POA: Diagnosis not present

## 2021-07-29 DIAGNOSIS — Z28310 Unvaccinated for covid-19: Secondary | ICD-10-CM | POA: Diagnosis not present

## 2021-07-29 DIAGNOSIS — R419 Unspecified symptoms and signs involving cognitive functions and awareness: Secondary | ICD-10-CM | POA: Diagnosis not present

## 2021-07-29 DIAGNOSIS — M5430 Sciatica, unspecified side: Secondary | ICD-10-CM | POA: Diagnosis not present

## 2021-07-29 DIAGNOSIS — F419 Anxiety disorder, unspecified: Secondary | ICD-10-CM | POA: Diagnosis not present

## 2021-07-29 DIAGNOSIS — R509 Fever, unspecified: Secondary | ICD-10-CM | POA: Diagnosis not present

## 2021-07-29 DIAGNOSIS — Z720 Tobacco use: Secondary | ICD-10-CM | POA: Diagnosis not present

## 2021-07-29 DIAGNOSIS — B349 Viral infection, unspecified: Secondary | ICD-10-CM | POA: Diagnosis not present

## 2021-07-30 DIAGNOSIS — M5430 Sciatica, unspecified side: Secondary | ICD-10-CM | POA: Diagnosis not present

## 2021-07-30 DIAGNOSIS — B349 Viral infection, unspecified: Secondary | ICD-10-CM | POA: Diagnosis not present

## 2021-07-30 DIAGNOSIS — R059 Cough, unspecified: Secondary | ICD-10-CM | POA: Diagnosis not present

## 2021-07-30 DIAGNOSIS — R509 Fever, unspecified: Secondary | ICD-10-CM | POA: Diagnosis not present

## 2021-07-30 DIAGNOSIS — R419 Unspecified symptoms and signs involving cognitive functions and awareness: Secondary | ICD-10-CM | POA: Diagnosis not present

## 2021-07-30 DIAGNOSIS — M40202 Unspecified kyphosis, cervical region: Secondary | ICD-10-CM | POA: Diagnosis not present

## 2021-07-30 DIAGNOSIS — Z28310 Unvaccinated for covid-19: Secondary | ICD-10-CM | POA: Diagnosis not present

## 2021-07-30 DIAGNOSIS — F419 Anxiety disorder, unspecified: Secondary | ICD-10-CM | POA: Diagnosis not present

## 2021-07-31 DIAGNOSIS — J101 Influenza due to other identified influenza virus with other respiratory manifestations: Secondary | ICD-10-CM | POA: Diagnosis not present

## 2021-07-31 DIAGNOSIS — Z6830 Body mass index (BMI) 30.0-30.9, adult: Secondary | ICD-10-CM | POA: Diagnosis not present

## 2021-07-31 DIAGNOSIS — R059 Cough, unspecified: Secondary | ICD-10-CM | POA: Diagnosis not present

## 2021-08-09 DIAGNOSIS — Z419 Encounter for procedure for purposes other than remedying health state, unspecified: Secondary | ICD-10-CM | POA: Diagnosis not present

## 2021-08-27 DIAGNOSIS — I878 Other specified disorders of veins: Secondary | ICD-10-CM | POA: Diagnosis not present

## 2021-09-09 DIAGNOSIS — Z419 Encounter for procedure for purposes other than remedying health state, unspecified: Secondary | ICD-10-CM | POA: Diagnosis not present

## 2021-10-03 DIAGNOSIS — H6503 Acute serous otitis media, bilateral: Secondary | ICD-10-CM | POA: Diagnosis not present

## 2021-10-03 DIAGNOSIS — Z6831 Body mass index (BMI) 31.0-31.9, adult: Secondary | ICD-10-CM | POA: Diagnosis not present

## 2021-10-09 DIAGNOSIS — Y929 Unspecified place or not applicable: Secondary | ICD-10-CM | POA: Diagnosis not present

## 2021-10-09 DIAGNOSIS — X509XXA Other and unspecified overexertion or strenuous movements or postures, initial encounter: Secondary | ICD-10-CM | POA: Diagnosis not present

## 2021-10-09 DIAGNOSIS — Z28310 Unvaccinated for covid-19: Secondary | ICD-10-CM | POA: Diagnosis not present

## 2021-10-09 DIAGNOSIS — M545 Low back pain, unspecified: Secondary | ICD-10-CM | POA: Diagnosis not present

## 2021-10-09 DIAGNOSIS — X500XXA Overexertion from strenuous movement or load, initial encounter: Secondary | ICD-10-CM | POA: Diagnosis not present

## 2021-10-09 DIAGNOSIS — Z289 Immunization not carried out for unspecified reason: Secondary | ICD-10-CM | POA: Diagnosis not present

## 2021-10-09 DIAGNOSIS — S39012A Strain of muscle, fascia and tendon of lower back, initial encounter: Secondary | ICD-10-CM | POA: Diagnosis not present

## 2021-10-09 DIAGNOSIS — Y9389 Activity, other specified: Secondary | ICD-10-CM | POA: Diagnosis not present

## 2021-10-09 DIAGNOSIS — S3992XA Unspecified injury of lower back, initial encounter: Secondary | ICD-10-CM | POA: Diagnosis not present

## 2021-10-09 DIAGNOSIS — Z720 Tobacco use: Secondary | ICD-10-CM | POA: Diagnosis not present

## 2021-10-10 DIAGNOSIS — Z419 Encounter for procedure for purposes other than remedying health state, unspecified: Secondary | ICD-10-CM | POA: Diagnosis not present

## 2021-10-15 ENCOUNTER — Encounter: Payer: Self-pay | Admitting: Physical Therapy

## 2021-10-17 DIAGNOSIS — M549 Dorsalgia, unspecified: Secondary | ICD-10-CM | POA: Diagnosis not present

## 2021-10-17 DIAGNOSIS — Z720 Tobacco use: Secondary | ICD-10-CM | POA: Diagnosis not present

## 2021-10-17 DIAGNOSIS — M545 Low back pain, unspecified: Secondary | ICD-10-CM | POA: Diagnosis not present

## 2021-10-17 DIAGNOSIS — R262 Difficulty in walking, not elsewhere classified: Secondary | ICD-10-CM | POA: Diagnosis not present

## 2021-10-23 DIAGNOSIS — M549 Dorsalgia, unspecified: Secondary | ICD-10-CM | POA: Diagnosis not present

## 2021-10-25 ENCOUNTER — Other Ambulatory Visit: Payer: Self-pay

## 2021-10-25 ENCOUNTER — Ambulatory Visit: Payer: Medicaid Other | Attending: Family

## 2021-10-25 DIAGNOSIS — M542 Cervicalgia: Secondary | ICD-10-CM | POA: Insufficient documentation

## 2021-10-25 NOTE — Therapy (Signed)
[facility] [place], [place], [address] Phone: [phone]   Fax:  [phone]  Physical Therapy Evaluation  Patient Details  Name: Yesenia Gates MRN: [id number] Date of Birth: 1981/08/10 Referring Provider (PT): Loletha Grayer FNP.   Encounter Date: 10/25/2021   PT End of Session - 10/25/21 1038     Visit Number 1    Number of Visits 10    Date for PT Re-Evaluation 01/04/22    PT Start Time 1038    PT Stop Time 1113    PT Time Calculation (min) 35 min    Activity Tolerance Patient tolerated treatment well    Behavior During Therapy WFL for tasks assessed/performed             Past Medical History:  Diagnosis Date   Abnormal Pap smear    Anxiety    Arthritis    Chronic back pain    DDD (degenerative disc disease), lumbar    Headache    Herniated disc    Ovarian cyst    Spondylisthesis     Past Surgical History:  Procedure Laterality Date   dilation and cutterage      There were no vitals filed for this visit.    Subjective Assessment - 10/25/21 1038     Subjective Patient reports that her neck pain and headaches have begun to come back since she was in therapy last July. She notes that physical therapy was able to take her pain away completely. She notes that her pain began to return earlier this month and has remained pretty steady the last few weeks. She notes that her pain feels that same as it did last year. She notes that she has gone to the hospital twice this month due to her neck and back pain. She has also noticed that her neck pain is better in the morning, but gets worse throughout the day.    Pertinent History low back pain with numbness in her 3rd and 4th toes of her right foot    Limitations Lifting    Patient Stated Goals reduced pain and headaches, return to her normal activies    Currently in Pain? Yes    Pain Score 5     Pain Location Neck    Pain Orientation Right;Left     Pain Descriptors / Indicators Sharp;Aching;Headache;Pressure;Dull    Pain Type Acute pain    Pain Radiating Towards into right shoulder    Pain Onset 1 to 4 weeks ago    Pain Frequency Constant    Aggravating Factors  moving her neck, lifting about 8 pounds, pushing, pulling    Pain Relieving Factors ice, laying down    Effect of Pain on Daily Activities unable to clean her house since her pain began                [facility] PT Assessment - 10/25/21 0001       Assessment   Medical Diagnosis Cervical pain    Referring Provider (PT) Loletha Grayer FNP.    Onset Date/Surgical Date --   10/2021   Hand Dominance Right    Next MD Visit 11/06/21   orthopedic   Prior Therapy Yes, last year      Precautions   Precautions None      Balance Screen   Has the patient fallen in the past 6 months No    Has the patient had a decrease in activity level because of a fear of  falling?  No    Is the patient reluctant to leave their home because of a fear of falling?  No      Home Ec[employer] residence    Living Arrangements Children      Prior Function   Level of Independence Independent    Vocation Full time employment    Vocation Requirements standing; lifting (unsure of weight)      Cognition   Overall Cognitive Status Within Functional Limits for tasks assessed    Attention Focused    Focused Attention Appears intact    Memory Appears intact    Awareness Appears intact    Problem Solving Appears intact      Sensation   Additional Comments Patient reports tingling in her right shoulder (intermittent). She also feels some "crawling" in her neck and shoulder.      AROM   AROM Assessment Site Cervical    Cervical Flexion 34    Cervical Extension 24   familiar pain   Cervical - Right Side Bend 22   prior to cervical rotation; cervical pain   Cervical - Left Side Bend 30   cervical pain   Cervical - Right Rotation 54    Cervical - Left Rotation 48    left sided cervical pain     Strength   Strength Assessment Site Shoulder;Hand;Elbow    Right/Left Shoulder Right;Left    Right Shoulder Flexion 4/5    Right Shoulder ABduction 4+/5    Left Shoulder Flexion 4/5    Left Shoulder ABduction 4+/5    Right/Left Elbow Right;Left    Right Elbow Flexion 4/5    Right Elbow Extension 4/5    Left Elbow Flexion 4+/5    Left Elbow Extension 5/5    Right/Left hand Right;Left    Right Hand Grip (lbs) 58    Left Hand Grip (lbs) 55      Palpation   Spinal mobility Cervical: hypomobile and painful throughout    Palpation comment TTP: bilateral upper trapezius (R>L), cervical paraspinals (R>L), right levator scap, bilateral suboccipitals,      Special Tests    Special Tests Cervical    Cervical Tests Spurling's;Dictraction;other;other2      Spurling's   Findings Positive      Distraction Test   Findngs Negative      other    Findings Negative    Comment Sharp Purser      other    Findings Negative    Comment Alar ligament                        Objective measurements completed on examination: See above findings.                     PT Long Term Goals - 10/25/21 1641       PT LONG TERM GOAL #1   Title Patient will be independent with her HEP.    Time 5    Period Weeks    Status New    Target Date 11/29/21      PT LONG TERM GOAL #2   Title Patient will be able to demonstrate at least 60 degrees of cervical rotation bilaterally.    Time 5    Period Weeks    Status New    Target Date 11/29/21      PT LONG TERM GOAL #3   Title Patient will be able to clean  her house without being limited by her familar cervical pain and headaches.    Time 5    Period Weeks    Status New    Target Date 11/29/21                    Plan - 10/25/21 1257     Clinical Impression Statement Patient is 41 year old female presenting to physical therapy with cervical pain and headaches. She presented  today with moderate pain severity and irritability. She exhibited reduced AROM and increased tenderness to palapation with the right side being greater than the left. Recommend that she continue with her plan of care to address her remaining impairments to return to her prior level of function.    Personal Factors and Comorbidities Other;Comorbidity 1    Comorbidities Chronic back pain, DDD.    Examination-Activity Limitations Carry;Lift    Examination-Participation Restrictions Occupation;Driving    Stability/Clinical Decision Making Evolving/Moderate complexity    Clinical Decision Making Moderate    Rehab Potential Good    PT Frequency 2x / week    PT Duration Other (comment)   5 weeks   PT Treatment/Interventions Neuromuscular re-education;Therapeutic exercise;Therapeutic activities;Manual techniques;Patient/family education;Electrical Stimulation;Cryotherapy;Ultrasound;Traction    PT Next Visit Plan UBE, soft tissue mobilization to cervical musculature, postural re-ed, and modalities as needed    Consulted and Agree with Plan of Care Patient             Patient will benefit from skilled therapeutic intervention in order to improve the following deficits and impairments:  Decreased range of motion, Impaired UE functional use, Decreased activity tolerance, Pain, Hypomobility, Impaired sensation, Decreased strength  Visit Diagnosis: Cervicalgia     Problem List Patient Active Problem List   Diagnosis Date Noted   Obesity (BMI 30-39.9) 02/20/2016   Allergic rhinitis 07/17/2015   Current smoker 07/17/2015   Vitamin D deficiency 11/29/2014   Hyperlipidemia 11/29/2014    Darlin Coco, PT 10/25/2021, 4:48 PM  [facility] [address] [place], [place], [address] Phone: [phone]   Fax:  [phone]  Name: Yesenia Gates MRN: [id number] Date of Birth: 1980-11-26

## 2021-11-01 ENCOUNTER — Ambulatory Visit: Payer: BC Managed Care – PPO | Admitting: Rehabilitative and Restorative Service Providers"

## 2021-11-04 DIAGNOSIS — Z880 Allergy status to penicillin: Secondary | ICD-10-CM | POA: Diagnosis not present

## 2021-11-04 DIAGNOSIS — D259 Leiomyoma of uterus, unspecified: Secondary | ICD-10-CM | POA: Diagnosis not present

## 2021-11-04 DIAGNOSIS — M549 Dorsalgia, unspecified: Secondary | ICD-10-CM | POA: Diagnosis not present

## 2021-11-04 DIAGNOSIS — F32A Depression, unspecified: Secondary | ICD-10-CM | POA: Diagnosis not present

## 2021-11-04 DIAGNOSIS — E785 Hyperlipidemia, unspecified: Secondary | ICD-10-CM | POA: Diagnosis not present

## 2021-11-04 DIAGNOSIS — F419 Anxiety disorder, unspecified: Secondary | ICD-10-CM | POA: Diagnosis not present

## 2021-11-04 DIAGNOSIS — R109 Unspecified abdominal pain: Secondary | ICD-10-CM | POA: Diagnosis not present

## 2021-11-06 DIAGNOSIS — M542 Cervicalgia: Secondary | ICD-10-CM | POA: Diagnosis not present

## 2021-11-06 DIAGNOSIS — M5451 Vertebrogenic low back pain: Secondary | ICD-10-CM | POA: Diagnosis not present

## 2021-11-07 DIAGNOSIS — Z419 Encounter for procedure for purposes other than remedying health state, unspecified: Secondary | ICD-10-CM | POA: Diagnosis not present

## 2021-11-08 ENCOUNTER — Ambulatory Visit: Payer: Medicaid Other | Attending: Family

## 2021-11-08 DIAGNOSIS — R293 Abnormal posture: Secondary | ICD-10-CM | POA: Insufficient documentation

## 2021-11-08 DIAGNOSIS — M542 Cervicalgia: Secondary | ICD-10-CM | POA: Insufficient documentation

## 2021-11-09 ENCOUNTER — Ambulatory Visit: Payer: Medicaid Other | Admitting: *Deleted

## 2021-11-09 ENCOUNTER — Other Ambulatory Visit: Payer: Self-pay

## 2021-11-09 DIAGNOSIS — R293 Abnormal posture: Secondary | ICD-10-CM

## 2021-11-09 DIAGNOSIS — M542 Cervicalgia: Secondary | ICD-10-CM

## 2021-11-09 NOTE — Therapy (Signed)
[place] ?[facility] ?[address] ?[place], [place], [address] ?Phone: [phone]   Fax:  [phone] ? ?Physical Therapy Treatment ? ?Patient Details  ?Name: Yesenia Gates ?MRN: [id number] ?Date of Birth: 1981/07/28 ?Referring Provider (PT): Loletha Grayer FNP. ? ? ?Encounter Date: 11/09/2021 ? ? PT End of Session - 11/09/21 0906   ? ? Visit Number 2   ? Number of Visits 10   ? Date for PT Re-Evaluation 01/04/22   ? PT Start Time 0900   ? PT Stop Time [phone]   ? PT Time Calculation (min) 51 min   ? ?  ?  ? ?  ? ? ?Past Medical History:  ?Diagnosis Date  ? Abnormal Pap smear   ? Anxiety   ? Arthritis   ? Chronic back pain   ? DDD (degenerative disc disease), lumbar   ? Headache   ? Herniated disc   ? Ovarian cyst   ? Spondylisthesis   ? ? ?Past Surgical History:  ?Procedure Laterality Date  ? dilation and cutterage    ? ? ?There were no vitals filed for this visit. ? ? Subjective Assessment - 11/09/21 0905   ? ? Subjective Pt reports neck pain isn't as bad in the morning, but gets worse as the day goes on. Doing okay with HEP   ? Pertinent History low back pain with numbness in her 3rd and 4th toes of her right foot   ? Limitations Lifting   ? Patient Stated Goals reduced pain and headaches, return to her normal activies   ? Currently in Pain? Yes   ? Pain Score 2    ? Pain Location Neck   ? Pain Orientation Right;Left   ? Pain Descriptors / Indicators Sore;Sharp;Aching   ? Pain Type Acute pain   ? Pain Onset 1 to 4 weeks ago   ? ?  ?  ? ?  ? ? ? ? ? ? ? ? ? ? ? ? ? ? ? ? ? ? ? ? [facility] Adult PT Treatment/Exercise - 11/09/21 0001   ? ?  ? Exercises  ? Exercises Neck   ?  ? Neck Exercises: Machines for Strengthening  ? UBE (Upper Arm Bike) 6 mins at 120 RPMs   ?  ? Modalities  ? Modalities Electrical Stimulation;Ultrasound   ?  ? Electrical Stimulation  ? Electrical Stimulation Location Bil cerv. paras   ? Electrical Stimulation Action Seated IFC x 15 mins   ? Electrical  Stimulation Parameters 80-150hz    ? Electrical Stimulation Goals Pain   ?  ? Ultrasound  ? Ultrasound Location BIL. cerv. paras / UTs   ? Ultrasound Parameters 1.5 w/cm2 x 12 mins   ? Ultrasound Goals Pain   ?  ? Manual Therapy  ? Manual Therapy Soft tissue mobilization   ? Soft tissue mobilization STW to bil cerv. paras as well as TPR to bil. UTs all in seated position   ? ?  ?  ? ?  ? ? ? ? ? ? ? ? ? ? ? ? ? ? ? PT Long Term Goals - 10/25/21 1641   ? ?  ? PT LONG TERM GOAL #1  ? Title Patient will be independent with her HEP.   ? Time 5   ? Period Weeks   ? Status New   ? Target Date 11/29/21   ?  ? PT LONG TERM GOAL #2  ? Title Patient will be able to demonstrate  at least 60 degrees of cervical rotation bilaterally.   ? Time 5   ? Period Weeks   ? Status New   ? Target Date 11/29/21   ?  ? PT LONG TERM GOAL #3  ? Title Patient will be able to clean her house without being limited by her familar cervical pain and headaches.   ? Time 5   ? Period Weeks   ? Status New   ? Target Date 11/29/21   ? ?  ?  ? ?  ? ? ? ? ? ? ? ? Plan - 11/09/21 0902   ? ? Clinical Impression Statement Pt arrived today doing fairly well with minimal neck pain this AM and reports it gets worse as the day goes on. She reports exs are going okay at home. Today's Rx focused on Bil. cerv. paras and UT tightness with exs, [place] combo, STW, and estim. Pt did well with Rx .   ? Personal Factors and Comorbidities Other;Comorbidity 1   ? Comorbidities Chronic back pain, DDD.   ? Examination-Participation Restrictions Occupation;Driving   ? Stability/Clinical Decision Making Evolving/Moderate complexity   ? Rehab Potential Good   ? PT Frequency 2x / week   ? PT Duration Other (comment)   ? PT Treatment/Interventions Neuromuscular re-education;Therapeutic exercise;Therapeutic activities;Manual techniques;Patient/family education;Electrical Stimulation;Cryotherapy;Ultrasound;Traction   ? PT Next Visit Plan UBE, soft tissue mobilization to cervical  musculature, postural re-ed, and modalities as needed   ? ?  ?  ? ?  ? ? ?Patient will benefit from skilled therapeutic intervention in order to improve the following deficits and impairments:  Decreased range of motion, Impaired UE functional use, Decreased activity tolerance, Pain, Hypomobility, Impaired sensation, Decreased strength ? ?Visit Diagnosis: ?Cervicalgia ? ?Abnormal posture ? ? ? ? ?Problem List ?Patient Active Problem List  ? Diagnosis Date Noted  ? Obesity (BMI 30-39.9) 02/20/2016  ? Allergic rhinitis 07/17/2015  ? Current smoker 07/17/2015  ? Vitamin D deficiency 11/29/2014  ? Hyperlipidemia 11/29/2014  ? ? ?Cathlyn Tersigni,CHRIS, PTA ?11/09/2021, 11:30 AM ? ?[place] ?[facility] ?[address] ?[place], [place], [address] ?Phone: [phone]   Fax:  [phone] ? ?Name: CHAISE PASSARELLA ?MRN: [id number] ?Date of Birth: July 10, 1981 ? ? ? ?

## 2021-11-12 ENCOUNTER — Ambulatory Visit: Payer: Medicaid Other | Admitting: Physical Therapy

## 2021-11-14 ENCOUNTER — Ambulatory Visit: Payer: Medicaid Other | Admitting: Physical Therapy

## 2021-11-20 ENCOUNTER — Encounter: Payer: Medicaid Other | Admitting: Physical Therapy

## 2021-11-29 ENCOUNTER — Ambulatory Visit: Payer: Medicaid Other | Admitting: Physical Therapy

## 2021-11-29 ENCOUNTER — Other Ambulatory Visit: Payer: Self-pay

## 2021-11-29 ENCOUNTER — Encounter: Payer: Self-pay | Admitting: Physical Therapy

## 2021-11-29 DIAGNOSIS — M542 Cervicalgia: Secondary | ICD-10-CM

## 2021-11-29 DIAGNOSIS — R293 Abnormal posture: Secondary | ICD-10-CM

## 2021-11-29 NOTE — Therapy (Signed)
[place] ?[facility] ?[address] ?[place], [place], [address] ?Phone: [phone]   Fax:  [phone] ? ?Physical Therapy Treatment ? ?Patient Details  ?Name: Yesenia Gates ?MRN: [id number] ?Date of Birth: 1980-10-07 ?Referring Provider (PT): Loletha Grayer FNP. ? ? ?Encounter Date: 11/29/2021 ? ? PT End of Session - 11/29/21 0903   ? ? Visit Number 3   ? Number of Visits 10   ? Date for PT Re-Evaluation 01/04/22   ? PT Start Time [phone]   ? PT Stop Time [phone]   ? PT Time Calculation (min) 45 min   ? Activity Tolerance Patient tolerated treatment well   ? Behavior During Therapy [facility] for tasks assessed/performed   ? ?  ?  ? ?  ? ? ?Past Medical History:  ?Diagnosis Date  ? Abnormal Pap smear   ? Anxiety   ? Arthritis   ? Chronic back pain   ? DDD (degenerative disc disease), lumbar   ? Headache   ? Herniated disc   ? Ovarian cyst   ? Spondylisthesis   ? ? ?Past Surgical History:  ?Procedure Laterality Date  ? dilation and cutterage    ? ? ?There were no vitals filed for this visit. ? ? Subjective Assessment - 11/29/21 0902   ? ? Subjective Reports more neck pain as the day progresses.   ? Pertinent History low back pain with numbness in her 3rd and 4th toes of her right foot   ? Limitations Lifting   ? Patient Stated Goals reduced pain and headaches, return to her normal activies   ? Currently in Pain? Yes   ? Pain Score 3    ? Pain Location Neck   ? Pain Descriptors / Indicators Discomfort   ? Pain Type Acute pain   ? Pain Onset 1 to 4 weeks ago   ? Pain Frequency Constant   ? ?  ?  ? ?  ? ? ? ? ? [facility] PT Assessment - 11/29/21 0001   ? ?  ? Assessment  ? Medical Diagnosis Cervical pain   ? Referring Provider (PT) Loletha Grayer FNP.   ? Hand Dominance Right   ? Prior Therapy Yes, last year   ?  ? Precautions  ? Precautions None   ? ?  ?  ? ?  ? ? ? ? ? ? ? ? ? ? ? ? ? ? ? ? [place] Adult PT Treatment/Exercise - 11/29/21 0001   ? ?  ? Modalities  ? Modalities  Ultrasound;Traction   ?  ? Ultrasound  ? Ultrasound Location B cervical paraspinals/UT   ? Ultrasound Parameters Combo 1.5 w/cm2, 100%, 1 mhz x10 min   ? Ultrasound Goals Pain   ?  ? Traction  ? Type of Traction Cervical   ? Min (lbs) 5   ? Max (lbs) 13   ? Hold Time 99   ? Rest Time 5   ? Time 15   ?  ? Manual Therapy  ? Manual Therapy Soft tissue mobilization   ? Soft tissue mobilization STW to B cervical paraspinals, UT, suboccipitals to reduce tone and pain   L > R  ? ?  ?  ? ?  ? ? ? ? ? ? ? ? ? ? ? ? ? ? ? PT Long Term Goals - 10/25/21 1641   ? ?  ? PT LONG TERM GOAL #1  ? Title Patient will be independent with her HEP.   ?  Time 5   ? Period Weeks   ? Status New   ? Target Date 11/29/21   ?  ? PT LONG TERM GOAL #2  ? Title Patient will be able to demonstrate at least 60 degrees of cervical rotation bilaterally.   ? Time 5   ? Period Weeks   ? Status New   ? Target Date 11/29/21   ?  ? PT LONG TERM GOAL #3  ? Title Patient will be able to clean her house without being limited by her familar cervical pain and headaches.   ? Time 5   ? Period Weeks   ? Status New   ? Target Date 11/29/21   ? ?  ?  ? ?  ? ? ? ? ? ? ? ? Plan - 11/29/21 0938   ? ? Clinical Impression Statement Patient presented in clinic with reports of continued cervical pain and headaches radiating to either temple region from posterior head and neck area. Patient did present with increased tone of the L cervical paraspinals and suboccipitals region. Due to patient's vestibular symptoms from last PT session being gone, traction was initiated. Patient was hesitant about the set up and pull of traction but was educated regarding the process by PTA. Patient reported pressure at occipitotemporal region upon supine <> standing transition. Patient instructed to rest until symptoms subsided which she was able to leave clinic with less pressure after a few minutes.   ? Personal Factors and Comorbidities Other;Comorbidity 1   ? Comorbidities Chronic back  pain, DDD.   ? Examination-Activity Limitations Carry;Lift   ? Examination-Participation Restrictions Occupation;Driving   ? Stability/Clinical Decision Making Evolving/Moderate complexity   ? Rehab Potential Good   ? PT Frequency 2x / week   ? PT Duration Other (comment)   ? PT Treatment/Interventions Neuromuscular re-education;Therapeutic exercise;Therapeutic activities;Manual techniques;Patient/family education;Electrical Stimulation;Cryotherapy;Ultrasound;Traction   ? PT Next Visit Plan UBE, soft tissue mobilization to cervical musculature, postural re-ed, and modalities as needed   ? Consulted and Agree with Plan of Care Patient   ? ?  ?  ? ?  ? ? ?Patient will benefit from skilled therapeutic intervention in order to improve the following deficits and impairments:  Decreased range of motion, Impaired UE functional use, Decreased activity tolerance, Pain, Hypomobility, Impaired sensation, Decreased strength ? ?Visit Diagnosis: ?Cervicalgia ? ?Abnormal posture ? ? ? ? ?Problem List ?Patient Active Problem List  ? Diagnosis Date Noted  ? Obesity (BMI 30-39.9) 02/20/2016  ? Allergic rhinitis 07/17/2015  ? Current smoker 07/17/2015  ? Vitamin D deficiency 11/29/2014  ? Hyperlipidemia 11/29/2014  ? ? ?Standley Brooking, PTA ?11/29/2021, 10:48 AM ? ?[place] ?[facility] ?[address] ?[place], [place], [address] ?Phone: [phone]   Fax:  [phone] ? ?Name: Yesenia Gates ?MRN: [id number] ?Date of Birth: [birth year] ? ? ? ?

## 2021-11-30 ENCOUNTER — Ambulatory Visit: Payer: Medicaid Other

## 2021-11-30 DIAGNOSIS — R293 Abnormal posture: Secondary | ICD-10-CM | POA: Diagnosis not present

## 2021-11-30 DIAGNOSIS — M542 Cervicalgia: Secondary | ICD-10-CM

## 2021-11-30 NOTE — Therapy (Signed)
[facility] ?[facility] ?[address] ?[place], [place], [address] ?Phone: [phone]   Fax:  [phone] ? ?Physical Therapy Treatment ? ?Patient Details  ?Name: Yesenia Gates ?MRN: [id number] ?Date of Birth: [birth year] ?Referring Provider (PT): Loletha Grayer FNP. ? ? ?Encounter Date: 11/30/2021 ? ? PT End of Session - 11/30/21 0819   ? ? Visit Number 4   ? Number of Visits 10   ? Date for PT Re-Evaluation 01/04/22   ? PT Start Time 0815   ? PT Stop Time [phone]   ? PT Time Calculation (min) 48 min   ? Activity Tolerance Patient tolerated treatment well   ? Behavior During Therapy [facility] for tasks assessed/performed   ? ?  ?  ? ?  ? ? ?Past Medical History:  ?Diagnosis Date  ? Abnormal Pap smear   ? Anxiety   ? Arthritis   ? Chronic back pain   ? DDD (degenerative disc disease), lumbar   ? Headache   ? Herniated disc   ? Ovarian cyst   ? Spondylisthesis   ? ? ?Past Surgical History:  ?Procedure Laterality Date  ? dilation and cutterage    ? ? ?There were no vitals filed for this visit. ? ? Subjective Assessment - 11/30/21 0818   ? ? Subjective Pt arrives for today's treatment session denying any pain, until she lifts her legs to place them on the Nustep.  Pt reports back pain at 3/10.   ? Pertinent History low back pain with numbness in her 3rd and 4th toes of her right foot   ? Limitations Lifting   ? Patient Stated Goals reduced pain and headaches, return to her normal activies   ? Currently in Pain? Yes   ? Pain Score 3    ? Pain Location Back   ? Pain Onset 1 to 4 weeks ago   ? ?  ?  ? ?  ? ? ? ? ? ? ? ? ? ? ? ? ? ? ? ? ? ? ? ? [place] Adult PT Treatment/Exercise - 11/30/21 0001   ? ?  ? Exercises  ? Exercises Neck   ?  ? Neck Exercises: Machines for Strengthening  ? Nustep Lvl 2 x 12 mins   ?  ? Neck Exercises: Seated  ? Neck Retraction 15 reps;3 secs   cues for technique  ? Shoulder Shrugs 15 reps   ? Shoulder Rolls Forwards;Backwards;15 reps   ? Other Seated Exercise shoulder  row and extension x 15 reps yellow tband   ? Other Seated Exercise horrizontal abduction and ER x 15 reps yellow tband   ?  ? Modalities  ? Modalities Electrical Stimulation   ?  ? Electrical Stimulation  ? Electrical Stimulation Location Bilateral upper Traps   ? Electrical Stimulation Action Seated IFC   ? Electrical Stimulation Parameters 80-150 Hz x 15 min   ? Electrical Stimulation Goals Pain;Tone   ? ?  ?  ? ?  ? ? ? ? ? ? ? ? ? ? ? ? ? ? ? PT Long Term Goals - 10/25/21 1641   ? ?  ? PT LONG TERM GOAL #1  ? Title Patient will be independent with her HEP.   ? Time 5   ? Period Weeks   ? Status New   ? Target Date 11/29/21   ?  ? PT LONG TERM GOAL #2  ? Title Patient will be able to demonstrate at least 60  degrees of cervical rotation bilaterally.   ? Time 5   ? Period Weeks   ? Status New   ? Target Date 11/29/21   ?  ? PT LONG TERM GOAL #3  ? Title Patient will be able to clean her house without being limited by her familar cervical pain and headaches.   ? Time 5   ? Period Weeks   ? Status New   ? Target Date 11/29/21   ? ?  ?  ? ?  ? ? ? ? ? ? ? ? Plan - 11/30/21 0820   ? ? Clinical Impression Statement Pt arrives for today's treatment session denying any back or neck pain until she lifted her LEs to place them on the NuStep.  Pt able to tolerate 12 mins on the Nustep for warm up with slight increase in back and neck pain to 2/10.  Pt instructed in numerous seated neck and shoulder exercises to increase strength, function, and postural correction.  Pt requiring min cues for proper technique and posture with good carryover.  Normal responses to estim noted upon removal.  Pt reported 0/10 back and neck pain at completion of today's treatment session.   ? Personal Factors and Comorbidities Other;Comorbidity 1   ? Comorbidities Chronic back pain, DDD.   ? Examination-Activity Limitations Carry;Lift   ? Examination-Participation Restrictions Occupation;Driving   ? Stability/Clinical Decision Making  Evolving/Moderate complexity   ? Rehab Potential Good   ? PT Frequency 2x / week   ? PT Duration Other (comment)   ? PT Treatment/Interventions Neuromuscular re-education;Therapeutic exercise;Therapeutic activities;Manual techniques;Patient/family education;Electrical Stimulation;Cryotherapy;Ultrasound;Traction   ? PT Next Visit Plan UBE, soft tissue mobilization to cervical musculature, postural re-ed, and modalities as needed   ? Consulted and Agree with Plan of Care Patient   ? ?  ?  ? ?  ? ? ?Patient will benefit from skilled therapeutic intervention in order to improve the following deficits and impairments:  Decreased range of motion, Impaired UE functional use, Decreased activity tolerance, Pain, Hypomobility, Impaired sensation, Decreased strength ? ?Visit Diagnosis: ?Cervicalgia ? ?Abnormal posture ? ? ? ? ?Problem List ?Patient Active Problem List  ? Diagnosis Date Noted  ? Obesity (BMI 30-39.9) 02/20/2016  ? Allergic rhinitis 07/17/2015  ? Current smoker 07/17/2015  ? Vitamin D deficiency 11/29/2014  ? Hyperlipidemia 11/29/2014  ? ? ?Kathrynn Ducking, PTA ?11/30/2021, 9:06 AM ? ?[place] ?[facility] ?[address] ?[place], [place], [address] ?Phone: [phone]   Fax:  [phone] ? ?Name: Yesenia Gates ?MRN: [id number] ?Date of Birth: Feb 20, 1981 ? ? ? ?

## 2021-12-03 ENCOUNTER — Ambulatory Visit: Payer: Medicaid Other | Admitting: Physical Therapy

## 2021-12-03 ENCOUNTER — Other Ambulatory Visit: Payer: Self-pay

## 2021-12-03 ENCOUNTER — Encounter: Payer: Self-pay | Admitting: Physical Therapy

## 2021-12-03 DIAGNOSIS — R293 Abnormal posture: Secondary | ICD-10-CM

## 2021-12-03 DIAGNOSIS — M542 Cervicalgia: Secondary | ICD-10-CM | POA: Diagnosis not present

## 2021-12-03 NOTE — Therapy (Signed)
?[facility] ?[place] ?[place], [place], [address] ?Phone: [phone]   Fax:  [phone] ? ?Physical Therapy Treatment ? ?Patient Details  ?Name: Yesenia Gates ?MRN: [id number] ?Date of Birth: 1981/02/11 ?Referring Provider (PT): Loletha Grayer FNP. ? ? ?Encounter Date: 12/03/2021 ? ? PT End of Session - 12/03/21 0913   ? ? Visit Number 5   ? Number of Visits 10   ? Date for PT Re-Evaluation 01/04/22   ? PT Start Time [phone]   ? PT Stop Time 0946   ? PT Time Calculation (min) 43 min   ? Activity Tolerance Patient tolerated treatment well   ? Behavior During Therapy [facility] for tasks assessed/performed   ? ?  ?  ? ?  ? ? ?Past Medical History:  ?Diagnosis Date  ? Abnormal Pap smear   ? Anxiety   ? Arthritis   ? Chronic back pain   ? DDD (degenerative disc disease), lumbar   ? Headache   ? Herniated disc   ? Ovarian cyst   ? Spondylisthesis   ? ? ?Past Surgical History:  ?Procedure Laterality Date  ? dilation and cutterage    ? ? ?There were no vitals filed for this visit. ? ? Subjective Assessment - 12/03/21 0913   ? ? Subjective Feels okay but some feeling strange while on UBE.   ? Pertinent History low back pain with numbness in her 3rd and 4th toes of her right foot   ? Limitations Lifting   ? Patient Stated Goals reduced pain and headaches, return to her normal activies   ? Currently in Pain? Yes   ? Pain Score 3    ? Pain Location Neck   ? Pain Orientation Right;Left   ? Pain Descriptors / Indicators Discomfort   ? Pain Type Acute pain   ? Pain Onset 1 to 4 weeks ago   ? Pain Frequency Constant   ? ?  ?  ? ?  ? ? ? ? ? [facility] PT Assessment - 12/03/21 0001   ? ?  ? Assessment  ? Medical Diagnosis Cervical pain   ? Referring Provider (PT) Loletha Grayer FNP.   ? Hand Dominance Right   ? Prior Therapy Yes, last year   ?  ? Precautions  ? Precautions None   ? ?  ?  ? ?  ? ? ? ? ? ? ? ? ? ? ? ? ? ? ? ? [place] Adult PT Treatment/Exercise - 12/03/21 0001   ? ?  ? Neck  Exercises: Machines for Strengthening  ? UBE (Upper Arm Bike) 120RPM x10 min   ?  ? Neck Exercises: Seated  ? Neck Retraction 20 reps;5 secs   ? Shoulder Shrugs 20 reps   ? Shoulder Rolls Backwards;20 reps   ? Shoulder Flexion Both;20 reps   ? Shoulder Flexion Limitations with chin tuck   ? Other Seated Exercise B sh. ER, hor abd, row x20 reps each red theraband   ? Other Seated Exercise scap retraction AROM x15 reps   ?  ? Modalities  ? Modalities Electrical Stimulation   ?  ? Electrical Stimulation  ? Electrical Stimulation Location Bilateral upper Traps   ? Electrical Stimulation Action Pre-Mod   ? Electrical Stimulation Parameters 80-150 hz x15 min   ? Electrical Stimulation Goals Pain;Tone   ? ?  ?  ? ?  ? ? ? ? ? ? ? ? ? ? ? ? ? ? ?  PT Long Term Goals - 12/03/21 1020   ? ?  ? PT LONG TERM GOAL #1  ? Title Patient will be independent with her HEP.   ? Time 5   ? Period Weeks   ? Status On-going   ? Target Date 11/29/21   ?  ? PT LONG TERM GOAL #2  ? Title Patient will be able to demonstrate at least 60 degrees of cervical rotation bilaterally.   ? Time 5   ? Period Weeks   ? Status On-going   ? Target Date 11/29/21   ?  ? PT LONG TERM GOAL #3  ? Title Patient will be able to clean her house without being limited by her familar cervical pain and headaches.   ? Time 5   ? Period Weeks   ? Status On-going   ? Target Date 11/29/21   ? ?  ?  ? ?  ? ? ? ? ? ? ? ? Plan - 12/03/21 1008   ? ? Clinical Impression Statement Patient presented in clinic with reports of neck and some LBP while on UBE. 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 Patient VC'd to emphasize sitting scapular retractions. Normal integumentary response noted with electrical stimulation. Patient initially opted for supine position for modalities but transitioned to sitting per LBP in supine.   ? Personal Factors and Comorbidities  Other;Comorbidity 1   ? Comorbidities Chronic back pain, DDD.   ? Examination-Activity Limitations Carry;Lift   ? Examination-Participation Restrictions Occupation;Driving   ? Stability/Clinical Decision Making Evolving/Moderate complexity   ? Rehab Potential Good   ? PT Frequency 2x / week   ? PT Duration Other (comment)   ? PT Treatment/Interventions Neuromuscular re-education;Therapeutic exercise;Therapeutic activities;Manual techniques;Patient/family education;Electrical Stimulation;Cryotherapy;Ultrasound;Traction   ? PT Next Visit Plan UBE, soft tissue mobilization to cervical musculature, postural re-ed, and modalities as needed   ? Consulted and Agree with Plan of Care Patient   ? ?  ?  ? ?  ? ? ?Patient will benefit from skilled therapeutic intervention in order to improve the following deficits and impairments:  Decreased range of motion, Impaired UE functional use, Decreased activity tolerance, Pain, Hypomobility, Impaired sensation, Decreased strength ? ?Visit Diagnosis: ?Cervicalgia ? ?Abnormal posture ? ? ? ? ?Problem List ?Patient Active Problem List  ? Diagnosis Date Noted  ? Obesity (BMI 30-39.9) 02/20/2016  ? Allergic rhinitis 07/17/2015  ? Current smoker 07/17/2015  ? Vitamin D deficiency 11/29/2014  ? Hyperlipidemia 11/29/2014  ? ? ?Standley Brooking, PTA ?12/03/2021, 10:21 AM ? ?[place] ?[facility] ?[address] ?[place], [place], [address] ?Phone: [phone]   Fax:  [phone] ? ?Name: Yesenia Gates ?MRN: [id number] ?Date of Birth: 1981-08-03 ? ? ? ?

## 2021-12-04 ENCOUNTER — Ambulatory Visit: Payer: Medicaid Other | Admitting: *Deleted

## 2021-12-04 DIAGNOSIS — M542 Cervicalgia: Secondary | ICD-10-CM | POA: Diagnosis not present

## 2021-12-04 DIAGNOSIS — R293 Abnormal posture: Secondary | ICD-10-CM | POA: Diagnosis not present

## 2021-12-04 NOTE — Therapy (Signed)
[place] [facility] ?[place] ?[place], [place], [address] ?Phone: [phone]   Fax:  [phone] ? ?Physical Therapy Treatment ? ?Patient Details  ?Name: Yesenia Gates ?MRN: [id number] ?Date of Birth: 05-17-1981 ?Referring Provider (PT): Loletha Grayer FNP. ? ? ?Encounter Date: 12/04/2021 ? ? PT End of Session - 12/04/21 0908   ? ? Visit Number 6   ? Number of Visits 10   ? Date for PT Re-Evaluation 01/04/22   ? PT Start Time [phone]   ? PT Stop Time 4193   ? PT Time Calculation (min) 51 min   ? ?  ?  ? ?  ? ? ?Past Medical History:  ?Diagnosis Date  ? Abnormal Pap smear   ? Anxiety   ? Arthritis   ? Chronic back pain   ? DDD (degenerative disc disease), lumbar   ? Headache   ? Herniated disc   ? Ovarian cyst   ? Spondylisthesis   ? ? ?Past Surgical History:  ?Procedure Laterality Date  ? dilation and cutterage    ? ? ?There were no vitals filed for this visit. ? ? Subjective Assessment - 12/04/21 0905   ? ? Subjective Did ok after last Rx. 3/10 today   ? Pertinent History low back pain with numbness in her 3rd and 4th toes of her right foot   ? Limitations Lifting   ? Patient Stated Goals reduced pain and headaches, return to her normal activies   ? Currently in Pain? Yes   ? Pain Score 3    ? Pain Location Neck   ? Pain Orientation Right;Left   ? Pain Descriptors / Indicators Discomfort   ? Pain Type Acute pain   ? Pain Onset 1 to 4 weeks ago   ? ?  ?  ? ?  ? ? ? ? ? ? ? ? ? ? ? ? ? ? ? ? ? ? ? ? [facility] Adult PT Treatment/Exercise - 12/04/21 0001   ? ?  ? Neck Exercises: Machines for Strengthening  ? UBE (Upper Arm Bike) 120RPM x 6  min   ?  ? Neck Exercises: Standing  ? Other Standing Exercises yellow Tband HABD 2x10   ?  ? Neck Exercises: Seated  ? Neck Retraction 5 secs;10 reps   ? Shoulder Flexion Limitations --   ?  ? Modalities  ? Modalities Electrical Stimulation   ?  ? Electrical Stimulation  ? Electrical Stimulation Location Bilateral upper Traps   ? Electrical  Stimulation Action premod   ? Electrical Stimulation Parameters 80-'150hz'$  x 15 mins   ? Electrical Stimulation Goals Pain;Tone   ?  ? Ultrasound  ? Ultrasound Location BIl cerv. paras   ? Ultrasound Parameters Combo 1.5 w/cm2 x 10 mins   ? Ultrasound Goals Pain   ? ?  ?  ? ?  ? ? ? ? ? ? ? ? ? ? ? ? ? ? ? PT Long Term Goals - 12/03/21 1020   ? ?  ? PT LONG TERM GOAL #1  ? Title Patient will be independent with her HEP.   ? Time 5   ? Period Weeks   ? Status On-going   ? Target Date 11/29/21   ?  ? PT LONG TERM GOAL #2  ? Title Patient will be able to demonstrate at least 60 degrees of cervical rotation bilaterally.   ? Time 5   ? Period Weeks   ? Status On-going   ?  Target Date 11/29/21   ?  ? PT LONG TERM GOAL #3  ? Title Patient will be able to clean her house without being limited by her familar cervical pain and headaches.   ? Time 5   ? Period Weeks   ? Status On-going   ? Target Date 11/29/21   ? ?  ?  ? ?  ? ? ? ? ? ? ? ? Plan - 12/04/21 1659   ? ? Clinical Impression Statement Pt arrived today doing about the same with neck pain. Rx focused on postural exs as well as [place] combo and STW to Bil cerv. paras and UTs. Estim end of session. Pt did well and reports decreased pain end of Rx.   ? Personal Factors and Comorbidities Other;Comorbidity 1   ? Comorbidities Chronic back pain, DDD.   ? Examination-Participation Restrictions Occupation;Driving   ? Stability/Clinical Decision Making Evolving/Moderate complexity   ? Rehab Potential Good   ? PT Frequency 2x / week   ? PT Duration Other (comment)   ? PT Treatment/Interventions Neuromuscular re-education;Therapeutic exercise;Therapeutic activities;Manual techniques;Patient/family education;Electrical Stimulation;Cryotherapy;Ultrasound;Traction   ? PT Next Visit Plan UBE, soft tissue mobilization to cervical musculature, postural re-ed, and modalities as needed   ? Consulted and Agree with Plan of Care Patient   ? ?  ?  ? ?  ? ? ?Patient will benefit from skilled  therapeutic intervention in order to improve the following deficits and impairments:  Decreased range of motion, Impaired UE functional use, Decreased activity tolerance, Pain, Hypomobility, Impaired sensation, Decreased strength ? ?Visit Diagnosis: ?Cervicalgia ? ?Abnormal posture ? ? ? ? ?Problem List ?Patient Active Problem List  ? Diagnosis Date Noted  ? Obesity (BMI 30-39.9) 02/20/2016  ? Allergic rhinitis 07/17/2015  ? Current smoker 07/17/2015  ? Vitamin D deficiency 11/29/2014  ? Hyperlipidemia 11/29/2014  ? ? ?Ameris Akamine,CHRIS, PTA ?12/04/2021, 5:09 PM ? ?[place] ?[facility] ?[place] ?[place], [place], [address] ?Phone: [phone]   Fax:  [phone] ? ?Name: Yesenia Gates ?MRN: [id number] ?Date of Birth: [birth year] ? ? ? ?

## 2021-12-05 DIAGNOSIS — H10231 Serous conjunctivitis, except viral, right eye: Secondary | ICD-10-CM | POA: Diagnosis not present

## 2021-12-05 DIAGNOSIS — F410 Panic disorder [episodic paroxysmal anxiety] without agoraphobia: Secondary | ICD-10-CM | POA: Diagnosis not present

## 2021-12-05 DIAGNOSIS — Z6831 Body mass index (BMI) 31.0-31.9, adult: Secondary | ICD-10-CM | POA: Diagnosis not present

## 2021-12-06 ENCOUNTER — Ambulatory Visit: Payer: Medicaid Other

## 2021-12-06 DIAGNOSIS — R293 Abnormal posture: Secondary | ICD-10-CM | POA: Diagnosis not present

## 2021-12-06 DIAGNOSIS — M542 Cervicalgia: Secondary | ICD-10-CM | POA: Diagnosis not present

## 2021-12-06 NOTE — Therapy (Addendum)
[facility] [place], [place], [address] Phone: [phone]   Fax:  [phone]  Physical Therapy Treatment  Patient Details  Name: Yesenia Gates MRN: [id number] Date of Birth: May 06, 1981 Referring Provider (PT): Loletha Grayer FNP.   Encounter Date: 12/06/2021   PT End of Session - 12/06/21 0904     Visit Number 7    Number of Visits 10    Date for PT Re-Evaluation 01/04/22    PT Start Time 0900    PT Stop Time 0958    PT Time Calculation (min) 58 min    Activity Tolerance Patient tolerated treatment well    Behavior During Therapy WFL for tasks assessed/performed             Past Medical History:  Diagnosis Date   Abnormal Pap smear    Anxiety    Arthritis    Chronic back pain    DDD (degenerative disc disease), lumbar    Headache    Herniated disc    Ovarian cyst    Spondylisthesis     Past Surgical History:  Procedure Laterality Date   dilation and cutterage      There were no vitals filed for this visit.   Subjective Assessment - 12/06/21 0903     Subjective Patient reports that she feels alright today.    Pertinent History low back pain with numbness in her 3rd and 4th toes of her right foot    Limitations Lifting    Patient Stated Goals reduced pain and headaches, return to her normal activies    Currently in Pain? No/denies    Pain Onset 1 to 4 weeks ago                               [facility] Adult PT Treatment/Exercise - 12/06/21 0001       Neck Exercises: Machines for Strengthening   UBE (Upper Arm Bike) 120 RPM x 8 minutes      Neck Exercises: Theraband   Rows 20 reps    Rows Limitations Blue XTS    Shoulder External Rotation 20 reps;Green    Horizontal ABduction 20 reps;Green    Other Theraband Exercises Diagonals   green t-band; 10 reps each     Neck Exercises: Standing   Wall Push Ups 20 reps    Other Standing Exercises Chin tuck with cervical  rotation   20 reps     Neck Exercises: Seated   Neck Retraction 20 reps;5 secs      Neck Exercises: Stretches   Upper Trapezius Stretch Right;Left;4 reps;30 seconds      Modalities   Modalities Electrical Stimulation      Electrical Stimulation   Electrical Stimulation Location Bilateral upper Traps   no redness or adverse response   Electrical Stimulation Action 2 channel pre mod    Electrical Stimulation Parameters 80-150 Hz x 15 minutes    Electrical Stimulation Goals Pain;Tone                          PT Long Term Goals - 12/03/21 1020       PT LONG TERM GOAL #1   Title Patient will be independent with her HEP.    Time 5    Period Weeks    Status On-going    Target Date 11/29/21      PT LONG TERM GOAL #  2   Title Patient will be able to demonstrate at least 60 degrees of cervical rotation bilaterally.    Time 5    Period Weeks    Status On-going    Target Date 11/29/21      PT LONG TERM GOAL #3   Title Patient will be able to clean her house without being limited by her familar cervical pain and headaches.    Time 5    Period Weeks    Status On-going    Target Date 11/29/21                   Plan - 12/06/21 5885     Clinical Impression Statement Patient was introduced to multiple new interventions for improved cervical and scapular strength and stability. She required minimal cuing with today's new interventions initially, but she was then able to maintain proper biomechanics. She did not report any pain or discomfort with any of today's interventions. She reported feeling good upon the conclusion of treatment. She continues to require skilled physical therapy to address her remaining impairments to return to her prior level of function.    Personal Factors and Comorbidities Other;Comorbidity 1    Comorbidities Chronic back pain, DDD.    Examination-Participation Restrictions Occupation;Driving    Stability/Clinical Decision Making  Evolving/Moderate complexity    Rehab Potential Good    PT Frequency 2x / week    PT Duration Other (comment)    PT Treatment/Interventions Neuromuscular re-education;Therapeutic exercise;Therapeutic activities;Manual techniques;Patient/family education;Electrical Stimulation;Cryotherapy;Ultrasound;Traction    PT Next Visit Plan UBE, soft tissue mobilization to cervical musculature, postural re-ed, and modalities as needed    Consulted and Agree with Plan of Care Patient             Patient will benefit from skilled therapeutic intervention in order to improve the following deficits and impairments:  Decreased range of motion, Impaired UE functional use, Decreased activity tolerance, Pain, Hypomobility, Impaired sensation, Decreased strength  Visit Diagnosis: Cervicalgia  Abnormal posture     Problem List Patient Active Problem List   Diagnosis Date Noted   Obesity (BMI 30-39.9) 02/20/2016   Allergic rhinitis 07/17/2015   Current smoker 07/17/2015   Vitamin D deficiency 11/29/2014   Hyperlipidemia 11/29/2014    Darlin Coco, PT 12/06/2021, 10:12 AM  [facility] [address] [place], [place], [address] Phone: [phone]   Fax:  [phone]  Name: Yesenia Gates MRN: [id number] Date of Birth: 07/24/1981  PHYSICAL THERAPY DISCHARGE SUMMARY  Visits from Start of Care: 7  Current functional level related to goals / functional outcomes: Patient is being discharged at this time as she has not returned since her last appointment. She had yet to meet any of her goals at that time.    Remaining deficits: ROM, headaches, pain   Education / Equipment: HEP   Patient agrees to discharge. Patient goals were not met. Patient is being discharged due to not returning since the last visit.

## 2021-12-08 DIAGNOSIS — Z419 Encounter for procedure for purposes other than remedying health state, unspecified: Secondary | ICD-10-CM | POA: Diagnosis not present

## 2022-01-07 DIAGNOSIS — Z419 Encounter for procedure for purposes other than remedying health state, unspecified: Secondary | ICD-10-CM | POA: Diagnosis not present

## 2022-01-14 DIAGNOSIS — Z289 Immunization not carried out for unspecified reason: Secondary | ICD-10-CM | POA: Diagnosis not present

## 2022-01-14 DIAGNOSIS — R0781 Pleurodynia: Secondary | ICD-10-CM | POA: Diagnosis not present

## 2022-01-14 DIAGNOSIS — R109 Unspecified abdominal pain: Secondary | ICD-10-CM | POA: Diagnosis not present

## 2022-01-14 DIAGNOSIS — Z28310 Unvaccinated for covid-19: Secondary | ICD-10-CM | POA: Diagnosis not present

## 2022-01-14 DIAGNOSIS — Z720 Tobacco use: Secondary | ICD-10-CM | POA: Diagnosis not present

## 2022-01-14 DIAGNOSIS — M940 Chondrocostal junction syndrome [Tietze]: Secondary | ICD-10-CM | POA: Diagnosis not present

## 2022-01-14 DIAGNOSIS — F419 Anxiety disorder, unspecified: Secondary | ICD-10-CM | POA: Diagnosis not present

## 2022-02-07 DIAGNOSIS — Z419 Encounter for procedure for purposes other than remedying health state, unspecified: Secondary | ICD-10-CM | POA: Diagnosis not present

## 2022-02-12 DIAGNOSIS — B3731 Acute candidiasis of vulva and vagina: Secondary | ICD-10-CM | POA: Diagnosis not present

## 2022-02-12 DIAGNOSIS — F419 Anxiety disorder, unspecified: Secondary | ICD-10-CM | POA: Diagnosis not present

## 2022-02-12 DIAGNOSIS — Z720 Tobacco use: Secondary | ICD-10-CM | POA: Diagnosis not present

## 2022-02-12 DIAGNOSIS — L298 Other pruritus: Secondary | ICD-10-CM | POA: Diagnosis not present

## 2022-03-09 DIAGNOSIS — Z419 Encounter for procedure for purposes other than remedying health state, unspecified: Secondary | ICD-10-CM | POA: Diagnosis not present

## 2022-04-09 DIAGNOSIS — Z419 Encounter for procedure for purposes other than remedying health state, unspecified: Secondary | ICD-10-CM | POA: Diagnosis not present

## 2022-05-10 DIAGNOSIS — Z419 Encounter for procedure for purposes other than remedying health state, unspecified: Secondary | ICD-10-CM | POA: Diagnosis not present

## 2022-05-13 DIAGNOSIS — F419 Anxiety disorder, unspecified: Secondary | ICD-10-CM | POA: Diagnosis not present

## 2022-05-13 DIAGNOSIS — G43909 Migraine, unspecified, not intractable, without status migrainosus: Secondary | ICD-10-CM | POA: Diagnosis not present

## 2022-05-13 DIAGNOSIS — H6121 Impacted cerumen, right ear: Secondary | ICD-10-CM | POA: Diagnosis not present

## 2022-05-13 DIAGNOSIS — R519 Headache, unspecified: Secondary | ICD-10-CM | POA: Diagnosis not present

## 2022-05-13 DIAGNOSIS — M549 Dorsalgia, unspecified: Secondary | ICD-10-CM | POA: Diagnosis not present

## 2022-05-13 DIAGNOSIS — Z049 Encounter for examination and observation for unspecified reason: Secondary | ICD-10-CM | POA: Diagnosis not present

## 2022-05-17 DIAGNOSIS — J00 Acute nasopharyngitis [common cold]: Secondary | ICD-10-CM | POA: Diagnosis not present

## 2022-05-17 DIAGNOSIS — Z6832 Body mass index (BMI) 32.0-32.9, adult: Secondary | ICD-10-CM | POA: Diagnosis not present

## 2022-06-09 DIAGNOSIS — Z419 Encounter for procedure for purposes other than remedying health state, unspecified: Secondary | ICD-10-CM | POA: Diagnosis not present

## 2022-06-18 DIAGNOSIS — R109 Unspecified abdominal pain: Secondary | ICD-10-CM | POA: Diagnosis not present

## 2022-06-18 DIAGNOSIS — Z720 Tobacco use: Secondary | ICD-10-CM | POA: Diagnosis not present

## 2022-06-18 DIAGNOSIS — L299 Pruritus, unspecified: Secondary | ICD-10-CM | POA: Diagnosis not present

## 2022-06-18 DIAGNOSIS — K2970 Gastritis, unspecified, without bleeding: Secondary | ICD-10-CM | POA: Diagnosis not present

## 2022-06-18 DIAGNOSIS — F419 Anxiety disorder, unspecified: Secondary | ICD-10-CM | POA: Diagnosis not present

## 2022-06-18 DIAGNOSIS — R110 Nausea: Secondary | ICD-10-CM | POA: Diagnosis not present

## 2022-06-19 DIAGNOSIS — R110 Nausea: Secondary | ICD-10-CM | POA: Diagnosis not present

## 2022-06-19 DIAGNOSIS — L299 Pruritus, unspecified: Secondary | ICD-10-CM | POA: Diagnosis not present

## 2022-06-19 DIAGNOSIS — F419 Anxiety disorder, unspecified: Secondary | ICD-10-CM | POA: Diagnosis not present

## 2022-06-19 DIAGNOSIS — R109 Unspecified abdominal pain: Secondary | ICD-10-CM | POA: Diagnosis not present

## 2022-06-19 DIAGNOSIS — K2970 Gastritis, unspecified, without bleeding: Secondary | ICD-10-CM | POA: Diagnosis not present

## 2022-07-10 DIAGNOSIS — J329 Chronic sinusitis, unspecified: Secondary | ICD-10-CM | POA: Diagnosis not present

## 2022-07-10 DIAGNOSIS — R059 Cough, unspecified: Secondary | ICD-10-CM | POA: Diagnosis not present

## 2022-07-10 DIAGNOSIS — Z419 Encounter for procedure for purposes other than remedying health state, unspecified: Secondary | ICD-10-CM | POA: Diagnosis not present

## 2022-08-09 DIAGNOSIS — Z419 Encounter for procedure for purposes other than remedying health state, unspecified: Secondary | ICD-10-CM | POA: Diagnosis not present

## 2022-08-11 DIAGNOSIS — F32A Depression, unspecified: Secondary | ICD-10-CM | POA: Diagnosis not present

## 2022-08-11 DIAGNOSIS — F419 Anxiety disorder, unspecified: Secondary | ICD-10-CM | POA: Diagnosis not present

## 2022-08-11 DIAGNOSIS — R109 Unspecified abdominal pain: Secondary | ICD-10-CM | POA: Diagnosis not present

## 2022-08-11 DIAGNOSIS — K649 Unspecified hemorrhoids: Secondary | ICD-10-CM | POA: Diagnosis not present

## 2022-08-11 DIAGNOSIS — Z720 Tobacco use: Secondary | ICD-10-CM | POA: Diagnosis not present

## 2022-08-11 DIAGNOSIS — K921 Melena: Secondary | ICD-10-CM | POA: Diagnosis not present

## 2022-08-11 DIAGNOSIS — K5900 Constipation, unspecified: Secondary | ICD-10-CM | POA: Diagnosis not present

## 2022-09-09 DIAGNOSIS — Z419 Encounter for procedure for purposes other than remedying health state, unspecified: Secondary | ICD-10-CM | POA: Diagnosis not present

## 2022-10-10 DIAGNOSIS — Z419 Encounter for procedure for purposes other than remedying health state, unspecified: Secondary | ICD-10-CM | POA: Diagnosis not present

## 2022-11-08 DIAGNOSIS — Z419 Encounter for procedure for purposes other than remedying health state, unspecified: Secondary | ICD-10-CM | POA: Diagnosis not present

## 2022-11-29 DIAGNOSIS — R1314 Dysphagia, pharyngoesophageal phase: Secondary | ICD-10-CM | POA: Diagnosis not present

## 2022-12-08 DIAGNOSIS — R059 Cough, unspecified: Secondary | ICD-10-CM | POA: Diagnosis not present

## 2022-12-08 DIAGNOSIS — J309 Allergic rhinitis, unspecified: Secondary | ICD-10-CM | POA: Diagnosis not present

## 2022-12-09 DIAGNOSIS — Z419 Encounter for procedure for purposes other than remedying health state, unspecified: Secondary | ICD-10-CM | POA: Diagnosis not present

## 2022-12-17 NOTE — Progress Notes (Deleted)
GI Office Note    Referring Provider: Dorene Grebe* Primary Care Physician:  Jason Coop, FNP  Primary Gastroenterologist: ***  Chief Complaint   No chief complaint on file.  History of Present Illness   Yesenia Gates is a 42 y.o. female presenting today at the request of [name] for dysphagia.      Current Outpatient Medications  Medication Sig Dispense Refill   escitalopram (LEXAPRO) 10 MG tablet Take 1 tablet (10 mg total) by mouth daily. 90 tablet 3   Fremanezumab-vfrm (AJOVY) 225 MG/1.5ML SOAJ Inject 225 mg into the skin every 30 (thirty) days. (Patient not taking: Reported on 10/25/2021) 4.5 mL 4   LORazepam (ATIVAN) 0.5 MG tablet Take 0.5 mg by mouth daily as needed for anxiety.      Multiple Vitamin (MULTIVITAMIN) tablet Take 1 tablet by mouth daily.     Rimegepant Sulfate (NURTEC) 75 MG TBDP Take 75 mg by mouth daily as needed. (Patient not taking: Reported on 10/25/2021) 8 tablet 12   rizatriptan (MAXALT-MLT) 10 MG disintegrating tablet Take 1 tablet (10 mg total) by mouth as needed for migraine. May repeat in 2 hours if needed (Patient not taking: Reported on 10/25/2021) 9 tablet 11   topiramate (TOPAMAX) 50 MG tablet Take 1 tablet (50 mg total) by mouth 2 (two) times daily. (Patient not taking: Reported on 10/25/2021) 60 tablet 12   No current facility-administered medications for this visit.    Past Medical History:  Diagnosis Date   Abnormal Pap smear    Anxiety    Arthritis    Chronic back pain    DDD (degenerative disc disease), lumbar    Headache    Herniated disc    Ovarian cyst    Spondylisthesis     Past Surgical History:  Procedure Laterality Date   dilation and cutterage      Family History  Problem Relation Age of Onset   Diabetes Mother    Cancer Mother        cervical   Rheum arthritis Mother    Diabetes Father    Cancer Other    Diabetes Sister    Diabetes Brother    Cancer Maternal  Uncle        throat   Stroke Maternal Uncle    Cancer Maternal Grandmother        GYN   Heart attack Maternal Grandfather    Cancer Cousin        cervical   Breast cancer Cousin    Cancer Maternal Uncle        throat   Cancer Cousin        breast   Cancer Cousin        cervical    Allergies as of 12/18/2022 - Review Complete 12/06/2021  Allergen Reaction Noted   Penicillins Rash 06/02/2011    Social History   Socioeconomic History   Marital status: Single    Spouse name: Not on file   Number of children: 1   Years of education: 13   Highest education level: Not on file  Occupational History    Comment: [employer]   Tobacco Use   Smoking status: Every Day    Packs/day: 1    Types: Cigarettes   Smokeless tobacco: Never  Vaping Use   Vaping Use: Never used  Substance and Sexual Activity   Alcohol use: Yes    Comment: occasionally   Drug use: No  Sexual activity: Not Currently    Birth control/protection: Injection  Other Topics Concern   Not on file  Social History Narrative   Lives at home with son   Caffeine use- use rarely   Social Determinants of Health   Financial Resource Strain: Not on file  Food Insecurity: Not on file  Transportation Needs: Not on file  Physical Activity: Not on file  Stress: Not on file  Social Connections: Not on file  Intimate Partner Violence: Not on file     Review of Systems   Gen: Denies any fever, chills, fatigue, weight loss, lack of appetite.  CV: Denies chest pain, heart palpitations, peripheral edema, syncope.  Resp: Denies shortness of breath at rest or with exertion. Denies wheezing or cough.  GI: see HPI GU : Denies urinary burning, urinary frequency, urinary hesitancy MS: Denies joint pain, muscle weakness, cramps, or limitation of movement.  Derm: Denies rash, itching, dry skin Psych: Denies depression, anxiety, memory loss, and confusion Heme: Denies bruising, bleeding, and enlarged lymph  nodes.   Physical Exam   There were no vitals taken for this visit.  General:   Alert and oriented. Pleasant and cooperative. Well-nourished and well-developed.  Head:  Normocephalic and atraumatic. Eyes:  Without icterus, sclera clear and conjunctiva pink.  Ears:  Normal auditory acuity. Mouth:  No deformity or lesions, oral mucosa pink.  Lungs:  Clear to auscultation bilaterally. No wheezes, rales, or rhonchi. No distress.  Heart:  S1, S2 present without murmurs appreciated.  Abdomen:  +BS, soft, non-tender and non-distended. No HSM noted. No guarding or rebound. No masses appreciated.  Rectal:  Deferred  Msk:  Symmetrical without gross deformities. Normal posture. Extremities:  Without edema. Neurologic:  Alert and  oriented x4;  grossly normal neurologically. Skin:  Intact without significant lesions or rashes. Psych:  Alert and cooperative. Normal mood and affect.   Assessment   Yesenia Gates is a 42 y.o. female with a history of *** presenting today with   Dysphagia:   PLAN   ***    Brooke Bonito, MSN, FNP-BC, AGACNP-BC [facility]

## 2022-12-18 ENCOUNTER — Ambulatory Visit: Payer: BC Managed Care – PPO | Admitting: Gastroenterology

## 2023-01-08 DIAGNOSIS — Z419 Encounter for procedure for purposes other than remedying health state, unspecified: Secondary | ICD-10-CM | POA: Diagnosis not present

## 2023-02-08 DIAGNOSIS — Z419 Encounter for procedure for purposes other than remedying health state, unspecified: Secondary | ICD-10-CM | POA: Diagnosis not present

## 2023-03-08 DIAGNOSIS — Z720 Tobacco use: Secondary | ICD-10-CM | POA: Diagnosis not present

## 2023-03-08 DIAGNOSIS — G44209 Tension-type headache, unspecified, not intractable: Secondary | ICD-10-CM | POA: Diagnosis not present

## 2023-03-08 DIAGNOSIS — F411 Generalized anxiety disorder: Secondary | ICD-10-CM | POA: Diagnosis not present

## 2024-09-17 ENCOUNTER — Encounter (HOSPITAL_COMMUNITY): Payer: Self-pay

## 2024-09-17 ENCOUNTER — Other Ambulatory Visit: Payer: Self-pay

## 2024-09-17 ENCOUNTER — Emergency Department (HOSPITAL_COMMUNITY)
Admission: EM | Admit: 2024-09-17 | Discharge: 2024-09-17 | Disposition: A | Discharge status: Home / Self Care | Attending: Emergency Medicine | Admitting: Emergency Medicine

## 2024-09-17 ENCOUNTER — Emergency Department (HOSPITAL_COMMUNITY)

## 2024-09-17 DIAGNOSIS — R079 Chest pain, unspecified: Secondary | ICD-10-CM | POA: Diagnosis present

## 2024-09-17 LAB — BASIC METABOLIC PANEL WITH GFR
Anion gap: 13 (ref 5–15)
BUN: 10 mg/dL (ref 6–20)
CO2: 22 mmol/L (ref 22–32)
Calcium: 8.8 mg/dL — ABNORMAL LOW (ref 8.9–10.3)
Chloride: 106 mmol/L (ref 98–111)
Creatinine, Ser: 0.79 mg/dL (ref 0.44–1.00)
GFR, Estimated: >60 mL/min
Glucose, Bld: 107 mg/dL — ABNORMAL HIGH (ref 70–99)
Potassium: 4.1 mmol/L (ref 3.5–5.1)
Sodium: 140 mmol/L (ref 135–145)

## 2024-09-17 LAB — CBC
HCT: 33.4 % — ABNORMAL LOW (ref 36.0–46.0)
Hemoglobin: 10.3 g/dL — ABNORMAL LOW (ref 12.0–15.0)
MCH: 26.2 pg (ref 26.0–34.0)
MCHC: 30.8 g/dL (ref 30.0–36.0)
MCV: 85 fL (ref 80.0–100.0)
Platelets: 230 K/uL (ref 150–400)
RBC: 3.93 MIL/uL (ref 3.87–5.11)
RDW: 16.3 % — ABNORMAL HIGH (ref 11.5–15.5)
WBC: 5.4 K/uL (ref 4.0–10.5)
nRBC: 0 % (ref 0.0–0.2)

## 2024-09-17 LAB — TROPONIN T, HIGH SENSITIVITY: Troponin T High Sensitivity: <15 ng/L (ref 0–19)

## 2024-09-17 LAB — HCG, QUANTITATIVE, PREGNANCY: hCG, Beta Chain, Quant, S: <1 m[IU]/mL

## 2024-09-17 NOTE — ED Provider Notes (Signed)
 " [facility] AT [facility] Provider Note   CSN: [id number] Arrival date & time: 09/17/24  1028     Patient presents with: Chest Pain   Yesenia Gates is a 44 y.o. female.    Chest Pain Patient presents with chest pain.  Anterior chest.  Left upper chest and lower mid chest.  Comes and goes.  Has had taking Tums without relief.  Has had since Christmas.  States not associated with eating.  However states she does seem to have a come on with exertion.  Does have family history of cardiac disease.  No nausea or vomiting.     Prior to Admission medications  Medication Sig Start Date End Date Taking? Authorizing Provider  escitalopram  (LEXAPRO ) 10 MG tablet Take 1 tablet (10 mg total) by mouth daily. 04/04/16   Gladis Mary-Margaret, FNP  Fremanezumab -vfrm (AJOVY ) 225 MG/1.5ML SOAJ Inject 225 mg into the skin every 30 (thirty) days. Patient not taking: Reported on 10/25/2021 05/17/20   Penumalli, Vikram R, MD  LORazepam  (ATIVAN ) 0.5 MG tablet Take 0.5 mg by mouth daily as needed for anxiety.     [provider]  Multiple Vitamin (MULTIVITAMIN) tablet Take 1 tablet by mouth daily.    [provider]  Rimegepant Sulfate (NURTEC) 75 MG TBDP Take 75 mg by mouth daily as needed. Patient not taking: Reported on 10/25/2021 05/17/20   Penumalli, Vikram R, MD  rizatriptan  (MAXALT -MLT) 10 MG disintegrating tablet Take 1 tablet (10 mg total) by mouth as needed for migraine. May repeat in 2 hours if needed Patient not taking: Reported on 10/25/2021 05/17/20   Penumalli, Vikram R, MD  topiramate  (TOPAMAX ) 50 MG tablet Take 1 tablet (50 mg total) by mouth 2 (two) times daily. Patient not taking: Reported on 10/25/2021 05/17/20   Margaret Eduard SAUNDERS, MD    Allergies: Penicillins    Review of Systems  Cardiovascular:  Positive for chest pain.    Updated Vital Signs BP 118/81   Pulse 82   Temp 98.9 F (37.2 C) (Oral)   Resp 17   Ht 5' 6 (1.676 m)    Wt 93 kg   LMP 09/17/2024 (Exact Date)   SpO2 98%   BMI 33.09 kg/m   Physical Exam Vitals reviewed.  Cardiovascular:     Rate and Rhythm: Regular rhythm.  Pulmonary:     Breath sounds: No decreased breath sounds or wheezing.  Chest:     Chest wall: No tenderness.  Abdominal:     Tenderness: There is no abdominal tenderness.  Musculoskeletal:     Right lower leg: No edema.     Left lower leg: No edema.  Neurological:     Mental Status: She is alert.     (all labs ordered are listed, but only abnormal results are displayed) Labs Reviewed  BASIC METABOLIC PANEL WITH GFR - Abnormal; Notable for the following components:      Result Value   Glucose, Bld 107 (*)    Calcium  8.8 (*)    All other components within normal limits  CBC - Abnormal; Notable for the following components:   Hemoglobin 10.3 (*)    HCT 33.4 (*)    RDW 16.3 (*)    All other components within normal limits  HCG, QUANTITATIVE, PREGNANCY  TROPONIN T, HIGH SENSITIVITY    EKG: EKG Interpretation Date/Time:  Friday September 17 2024 10:39:41 EST Ventricular Rate:  87 PR Interval:  182 QRS Duration:  89 QT Interval:  358 QTC Calculation: 431 R Axis:   84  Text Interpretation: Sinus rhythm Confirmed by Patsey Lot [id number]) on 09/17/2024 11:09:53 AM  Radiology: [facility] Chest 2 View Result Date: 09/17/2024 CLINICAL DATA:  Central and left-sided chest pain for 2 weeks. EXAM: CHEST - 2 VIEW COMPARISON:  05/05/2020 FINDINGS: The heart size and mediastinal contours are within normal limits. Both lungs are clear. The visualized skeletal structures are unremarkable. IMPRESSION: No active cardiopulmonary disease. Electronically Signed   By: Norleen DELENA Kil M.D.   On: 09/17/2024 12:00     Procedures   Medications Ordered in the ED - No data to display                                  Medical Decision Making Amount and/or Complexity of Data Reviewed Labs: ordered. Radiology: ordered.   Patient with chest  pain.  Comes and goes somewhat.  Did have a sharp pain that came on quickly while sitting in the room.  No changes on the monitor at that time.  EKG overall reassuring.  Does have cardiac history in her family however.  Will get troponin.  Will get x-ray.  Troponin x-rays reassuring.  Doubt cardiac ischemia.  Doubt pulmonary embolism.  However with her exertional pain I think it would be beneficial to follow-up with cardiology.  Will discharge home.    Final diagnoses:  Nonspecific chest pain    ED Discharge Orders          Ordered    Ambulatory referral to Cardiology       Comments: If you have not heard from the Cardiology office within the next 72 hours please call [phone].   09/17/24 1226               Patsey Lot, MD 09/17/24 1229  "

## 2024-09-17 NOTE — ED Triage Notes (Signed)
 Pt reports intermittent central chest pain that radiates to left wall since Christmas. Pt reports tried tums with no relief. Pt denies n/v. Pt denies being around anyone sick. Pt states sharp shooting pains. Pt reports still has her gallbladder.

## 2024-11-11 ENCOUNTER — Ambulatory Visit: Admitting: Cardiology
